# Patient Record
Sex: Female | Born: 1935
Health system: Southern US, Community
[De-identification: ages and names within clinical notes are randomized; demographics above are authoritative.]

## PROBLEM LIST (undated history)

## (undated) DIAGNOSIS — E785 Hyperlipidemia, unspecified: Secondary | ICD-10-CM

## (undated) DIAGNOSIS — F32A Depression, unspecified: Secondary | ICD-10-CM

## (undated) DIAGNOSIS — E119 Type 2 diabetes mellitus without complications: Secondary | ICD-10-CM

## (undated) DIAGNOSIS — I10 Essential (primary) hypertension: Secondary | ICD-10-CM

## (undated) DIAGNOSIS — K219 Gastro-esophageal reflux disease without esophagitis: Secondary | ICD-10-CM

## (undated) DIAGNOSIS — K589 Irritable bowel syndrome without diarrhea: Secondary | ICD-10-CM

## (undated) DIAGNOSIS — J449 Chronic obstructive pulmonary disease, unspecified: Secondary | ICD-10-CM

## (undated) DIAGNOSIS — H409 Unspecified glaucoma: Secondary | ICD-10-CM

## (undated) DIAGNOSIS — F419 Anxiety disorder, unspecified: Secondary | ICD-10-CM

## (undated) DIAGNOSIS — J45909 Unspecified asthma, uncomplicated: Secondary | ICD-10-CM

## (undated) DIAGNOSIS — I2510 Atherosclerotic heart disease of native coronary artery without angina pectoris: Secondary | ICD-10-CM

## (undated) DIAGNOSIS — G8929 Other chronic pain: Secondary | ICD-10-CM

## (undated) DIAGNOSIS — M549 Dorsalgia, unspecified: Secondary | ICD-10-CM

## (undated) DIAGNOSIS — F329 Major depressive disorder, single episode, unspecified: Secondary | ICD-10-CM

## (undated) DIAGNOSIS — I639 Cerebral infarction, unspecified: Secondary | ICD-10-CM

## (undated) DIAGNOSIS — M1990 Unspecified osteoarthritis, unspecified site: Secondary | ICD-10-CM

## (undated) HISTORY — DX: Gastro-esophageal reflux disease without esophagitis: K21.9

## (undated) HISTORY — DX: Type 2 diabetes mellitus without complications: E11.9

## (undated) HISTORY — DX: Atherosclerotic heart disease of native coronary artery without angina pectoris: I25.10

## (undated) HISTORY — DX: Essential (primary) hypertension: I10

## (undated) HISTORY — DX: Other chronic pain: G89.29

## (undated) HISTORY — DX: Depression, unspecified: F32.A

## (undated) HISTORY — DX: Irritable bowel syndrome, unspecified: K58.9

## (undated) HISTORY — DX: Major depressive disorder, single episode, unspecified: F32.9

## (undated) HISTORY — PX: APPENDECTOMY: SHX54

## (undated) HISTORY — DX: Dorsalgia, unspecified: M54.9

## (undated) HISTORY — DX: Unspecified osteoarthritis, unspecified site: M19.90

## (undated) HISTORY — PX: CHOLECYSTECTOMY: SHX55

## (undated) HISTORY — DX: Chronic obstructive pulmonary disease, unspecified: J44.9

## (undated) HISTORY — PX: ABDOMINAL HYSTERECTOMY: SHX81

## (undated) HISTORY — DX: Anxiety disorder, unspecified: F41.9

## (undated) HISTORY — DX: Hyperlipidemia, unspecified: E78.5

---

## 1989-01-28 HISTORY — PX: CORONARY ANGIOPLASTY WITH STENT PLACEMENT: SHX49

## 1998-08-30 ENCOUNTER — Inpatient Hospital Stay (HOSPITAL_COMMUNITY): Admission: EM | Admit: 1998-08-30 | Discharge: 1998-09-06 | Payer: Self-pay | Admitting: Cardiovascular Disease

## 1998-08-31 ENCOUNTER — Encounter: Payer: Self-pay | Admitting: Neurosurgery

## 1999-04-26 ENCOUNTER — Encounter: Payer: Self-pay | Admitting: Cardiology

## 1999-04-26 ENCOUNTER — Inpatient Hospital Stay (HOSPITAL_COMMUNITY): Admission: AD | Admit: 1999-04-26 | Discharge: 1999-04-30 | Payer: Self-pay | Admitting: *Deleted

## 1999-07-24 ENCOUNTER — Encounter: Payer: Self-pay | Admitting: Cardiology

## 1999-07-24 ENCOUNTER — Inpatient Hospital Stay (HOSPITAL_COMMUNITY): Admission: AD | Admit: 1999-07-24 | Discharge: 1999-07-27 | Payer: Self-pay | Admitting: Cardiology

## 2000-03-04 ENCOUNTER — Ambulatory Visit (HOSPITAL_COMMUNITY): Admission: RE | Admit: 2000-03-04 | Discharge: 2000-03-04 | Payer: Self-pay | Admitting: Neurosurgery

## 2000-03-04 ENCOUNTER — Encounter: Payer: Self-pay | Admitting: Neurosurgery

## 2000-03-25 ENCOUNTER — Encounter: Payer: Self-pay | Admitting: Neurosurgery

## 2000-03-25 ENCOUNTER — Ambulatory Visit (HOSPITAL_COMMUNITY): Admission: RE | Admit: 2000-03-25 | Discharge: 2000-03-25 | Payer: Self-pay | Admitting: Neurosurgery

## 2000-04-08 ENCOUNTER — Ambulatory Visit (HOSPITAL_COMMUNITY): Admission: RE | Admit: 2000-04-08 | Discharge: 2000-04-08 | Payer: Self-pay | Admitting: Neurosurgery

## 2000-04-08 ENCOUNTER — Encounter: Payer: Self-pay | Admitting: Neurosurgery

## 2000-05-26 ENCOUNTER — Encounter (HOSPITAL_COMMUNITY): Admission: RE | Admit: 2000-05-26 | Discharge: 2000-06-25 | Payer: Self-pay | Admitting: Pulmonary Disease

## 2000-06-26 ENCOUNTER — Encounter (HOSPITAL_COMMUNITY): Admission: RE | Admit: 2000-06-26 | Discharge: 2000-07-26 | Payer: Self-pay | Admitting: Pulmonary Disease

## 2000-07-29 ENCOUNTER — Encounter (HOSPITAL_COMMUNITY): Admission: RE | Admit: 2000-07-29 | Discharge: 2000-08-28 | Payer: Self-pay | Admitting: Pulmonary Disease

## 2000-09-09 ENCOUNTER — Encounter (HOSPITAL_COMMUNITY): Admission: RE | Admit: 2000-09-09 | Discharge: 2000-10-09 | Payer: Self-pay | Admitting: Pulmonary Disease

## 2000-10-14 ENCOUNTER — Encounter (HOSPITAL_COMMUNITY): Admission: RE | Admit: 2000-10-14 | Discharge: 2000-11-13 | Payer: Self-pay | Admitting: Pulmonary Disease

## 2001-01-08 ENCOUNTER — Emergency Department (HOSPITAL_COMMUNITY): Admission: EM | Admit: 2001-01-08 | Discharge: 2001-01-08 | Payer: Self-pay | Admitting: *Deleted

## 2001-01-08 ENCOUNTER — Encounter: Payer: Self-pay | Admitting: *Deleted

## 2001-04-25 ENCOUNTER — Encounter: Payer: Self-pay | Admitting: Emergency Medicine

## 2001-04-25 ENCOUNTER — Inpatient Hospital Stay (HOSPITAL_COMMUNITY): Admission: EM | Admit: 2001-04-25 | Discharge: 2001-04-27 | Payer: Self-pay | Admitting: Cardiology

## 2001-04-25 ENCOUNTER — Emergency Department (HOSPITAL_COMMUNITY): Admission: EM | Admit: 2001-04-25 | Discharge: 2001-04-25 | Payer: Self-pay | Admitting: Emergency Medicine

## 2001-04-27 ENCOUNTER — Encounter: Payer: Self-pay | Admitting: Cardiology

## 2001-08-05 ENCOUNTER — Encounter: Payer: Self-pay | Admitting: Internal Medicine

## 2001-08-05 ENCOUNTER — Emergency Department (HOSPITAL_COMMUNITY): Admission: EM | Admit: 2001-08-05 | Discharge: 2001-08-05 | Payer: Self-pay | Admitting: Internal Medicine

## 2001-11-04 ENCOUNTER — Emergency Department (HOSPITAL_COMMUNITY): Admission: EM | Admit: 2001-11-04 | Discharge: 2001-11-05 | Payer: Self-pay | Admitting: *Deleted

## 2001-11-04 ENCOUNTER — Encounter: Payer: Self-pay | Admitting: *Deleted

## 2002-08-02 ENCOUNTER — Encounter: Payer: Self-pay | Admitting: *Deleted

## 2002-08-02 ENCOUNTER — Emergency Department (HOSPITAL_COMMUNITY): Admission: EM | Admit: 2002-08-02 | Discharge: 2002-08-02 | Payer: Self-pay | Admitting: *Deleted

## 2002-11-08 ENCOUNTER — Inpatient Hospital Stay (HOSPITAL_COMMUNITY): Admission: EM | Admit: 2002-11-08 | Discharge: 2002-11-10 | Payer: Self-pay | Admitting: Cardiology

## 2002-11-08 ENCOUNTER — Encounter: Payer: Self-pay | Admitting: Emergency Medicine

## 2002-11-08 ENCOUNTER — Emergency Department (HOSPITAL_COMMUNITY): Admission: EM | Admit: 2002-11-08 | Discharge: 2002-11-08 | Payer: Self-pay | Admitting: Emergency Medicine

## 2002-12-04 ENCOUNTER — Emergency Department (HOSPITAL_COMMUNITY): Admission: EM | Admit: 2002-12-04 | Discharge: 2002-12-04 | Payer: Self-pay | Admitting: Emergency Medicine

## 2003-04-21 ENCOUNTER — Ambulatory Visit (HOSPITAL_COMMUNITY): Admission: RE | Admit: 2003-04-21 | Discharge: 2003-04-21 | Payer: Self-pay | Admitting: Pulmonary Disease

## 2003-11-13 ENCOUNTER — Inpatient Hospital Stay (HOSPITAL_COMMUNITY): Admission: EM | Admit: 2003-11-13 | Discharge: 2003-11-17 | Payer: Self-pay | Admitting: Emergency Medicine

## 2004-07-09 ENCOUNTER — Ambulatory Visit: Payer: Self-pay | Admitting: Cardiology

## 2004-07-13 ENCOUNTER — Ambulatory Visit: Payer: Self-pay

## 2005-01-28 HISTORY — PX: COLONOSCOPY WITH ESOPHAGOGASTRODUODENOSCOPY (EGD): SHX5779

## 2005-02-11 ENCOUNTER — Ambulatory Visit: Payer: Self-pay | Admitting: Cardiology

## 2005-03-26 ENCOUNTER — Ambulatory Visit: Payer: Self-pay | Admitting: Cardiology

## 2005-09-21 ENCOUNTER — Emergency Department (HOSPITAL_COMMUNITY): Admission: EM | Admit: 2005-09-21 | Discharge: 2005-09-21 | Payer: Self-pay | Admitting: Emergency Medicine

## 2005-10-07 ENCOUNTER — Ambulatory Visit: Payer: Self-pay | Admitting: Gastroenterology

## 2005-11-11 ENCOUNTER — Ambulatory Visit (HOSPITAL_COMMUNITY): Admission: RE | Admit: 2005-11-11 | Discharge: 2005-11-11 | Payer: Self-pay | Admitting: Gastroenterology

## 2005-11-11 ENCOUNTER — Ambulatory Visit: Payer: Self-pay | Admitting: Gastroenterology

## 2005-12-23 ENCOUNTER — Ambulatory Visit: Payer: Self-pay | Admitting: Gastroenterology

## 2006-02-28 ENCOUNTER — Ambulatory Visit (HOSPITAL_COMMUNITY): Admission: RE | Admit: 2006-02-28 | Discharge: 2006-02-28 | Payer: Self-pay | Admitting: Pulmonary Disease

## 2006-06-09 ENCOUNTER — Ambulatory Visit (HOSPITAL_COMMUNITY): Admission: RE | Admit: 2006-06-09 | Discharge: 2006-06-09 | Payer: Self-pay | Admitting: Pulmonary Disease

## 2006-07-02 ENCOUNTER — Ambulatory Visit: Payer: Self-pay | Admitting: Gastroenterology

## 2007-02-04 ENCOUNTER — Ambulatory Visit: Payer: Self-pay | Admitting: Gastroenterology

## 2007-02-16 ENCOUNTER — Emergency Department (HOSPITAL_COMMUNITY): Admission: EM | Admit: 2007-02-16 | Discharge: 2007-02-16 | Payer: Self-pay | Admitting: Emergency Medicine

## 2007-08-07 ENCOUNTER — Inpatient Hospital Stay (HOSPITAL_COMMUNITY): Admission: EM | Admit: 2007-08-07 | Discharge: 2007-08-13 | Payer: Self-pay | Admitting: Emergency Medicine

## 2007-10-01 ENCOUNTER — Ambulatory Visit (HOSPITAL_COMMUNITY): Admission: RE | Admit: 2007-10-01 | Discharge: 2007-10-01 | Payer: Self-pay | Admitting: Pulmonary Disease

## 2008-03-31 ENCOUNTER — Ambulatory Visit: Payer: Self-pay | Admitting: Gastroenterology

## 2008-03-31 ENCOUNTER — Encounter: Payer: Self-pay | Admitting: Gastroenterology

## 2008-03-31 DIAGNOSIS — R1013 Epigastric pain: Secondary | ICD-10-CM

## 2008-03-31 LAB — CONVERTED CEMR LAB: Helicobacter Pylori Antibody-IgG: 0.5

## 2008-04-01 ENCOUNTER — Ambulatory Visit (HOSPITAL_COMMUNITY): Admission: RE | Admit: 2008-04-01 | Discharge: 2008-04-01 | Payer: Self-pay | Admitting: Gastroenterology

## 2008-04-01 LAB — CONVERTED CEMR LAB
ALT: 10 units/L (ref 0–35)
AST: 15 units/L (ref 0–37)
Albumin: 3.7 g/dL (ref 3.5–5.2)
Alkaline Phosphatase: 63 units/L (ref 39–117)
BUN: 9 mg/dL (ref 6–23)
CO2: 31 meq/L (ref 19–32)
Calcium: 9.4 mg/dL (ref 8.4–10.5)
Chloride: 102 meq/L (ref 96–112)
Creatinine, Ser: 0.8 mg/dL (ref 0.40–1.20)
Glucose, Bld: 110 mg/dL — ABNORMAL HIGH (ref 70–99)
Lipase: 18 units/L (ref 11–59)
Potassium: 3.5 meq/L (ref 3.5–5.3)
Sodium: 138 meq/L (ref 135–145)
Total Bilirubin: 1 mg/dL (ref 0.3–1.2)
Total Protein: 6.8 g/dL (ref 6.0–8.3)

## 2008-05-03 ENCOUNTER — Encounter: Payer: Self-pay | Admitting: Gastroenterology

## 2008-05-10 DIAGNOSIS — J439 Emphysema, unspecified: Secondary | ICD-10-CM

## 2008-05-10 DIAGNOSIS — K5900 Constipation, unspecified: Secondary | ICD-10-CM | POA: Insufficient documentation

## 2008-05-10 DIAGNOSIS — M549 Dorsalgia, unspecified: Secondary | ICD-10-CM | POA: Insufficient documentation

## 2008-05-10 DIAGNOSIS — K5730 Diverticulosis of large intestine without perforation or abscess without bleeding: Secondary | ICD-10-CM | POA: Insufficient documentation

## 2008-05-10 DIAGNOSIS — K649 Unspecified hemorrhoids: Secondary | ICD-10-CM | POA: Insufficient documentation

## 2008-05-10 DIAGNOSIS — M545 Low back pain, unspecified: Secondary | ICD-10-CM | POA: Insufficient documentation

## 2008-05-10 DIAGNOSIS — F411 Generalized anxiety disorder: Secondary | ICD-10-CM | POA: Insufficient documentation

## 2008-05-11 ENCOUNTER — Ambulatory Visit: Payer: Self-pay | Admitting: Gastroenterology

## 2008-06-19 ENCOUNTER — Emergency Department (HOSPITAL_COMMUNITY): Admission: EM | Admit: 2008-06-19 | Discharge: 2008-06-19 | Payer: Self-pay | Admitting: Emergency Medicine

## 2008-07-07 ENCOUNTER — Ambulatory Visit: Payer: Self-pay | Admitting: Gastroenterology

## 2008-07-08 DIAGNOSIS — K2970 Gastritis, unspecified, without bleeding: Secondary | ICD-10-CM | POA: Insufficient documentation

## 2008-07-08 DIAGNOSIS — K2990 Gastroduodenitis, unspecified, without bleeding: Secondary | ICD-10-CM

## 2008-09-22 ENCOUNTER — Ambulatory Visit: Payer: Self-pay | Admitting: Gastroenterology

## 2009-01-18 ENCOUNTER — Encounter (INDEPENDENT_AMBULATORY_CARE_PROVIDER_SITE_OTHER): Payer: Self-pay | Admitting: *Deleted

## 2009-03-01 ENCOUNTER — Ambulatory Visit (HOSPITAL_COMMUNITY): Admission: RE | Admit: 2009-03-01 | Discharge: 2009-03-01 | Payer: Self-pay | Admitting: Pulmonary Disease

## 2009-03-02 ENCOUNTER — Ambulatory Visit (HOSPITAL_COMMUNITY): Admission: RE | Admit: 2009-03-02 | Discharge: 2009-03-02 | Payer: Self-pay | Admitting: Pulmonary Disease

## 2009-03-06 ENCOUNTER — Ambulatory Visit: Payer: Self-pay | Admitting: Cardiology

## 2009-04-28 ENCOUNTER — Ambulatory Visit: Payer: Self-pay | Admitting: Gastroenterology

## 2009-07-10 IMAGING — CR DG SHOULDER 2+V*L*
3 series · 3 of 3 positions shown · non-contrast
Comparison: None

CLINICAL DATA: Left shoulder pain status post fall

LEFT SHOULDER - 2+ VIEW

[view not recorded (1 of 3)]
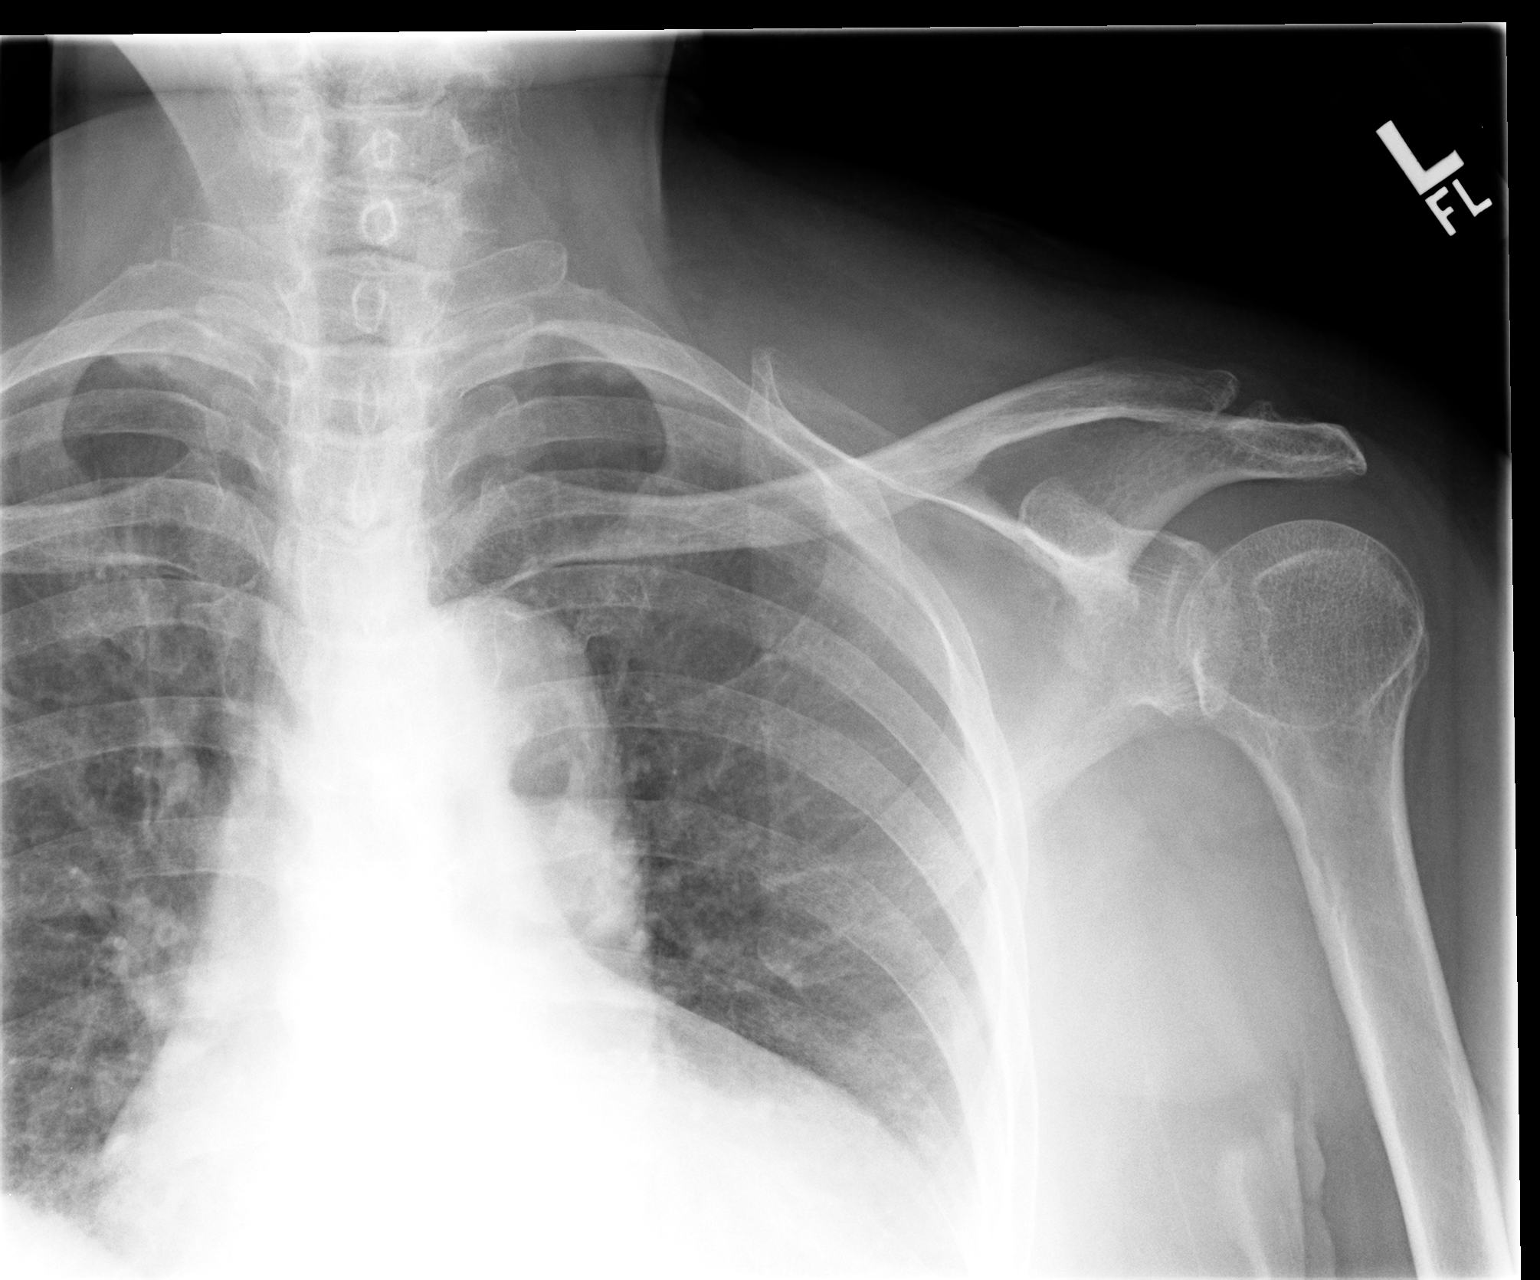

[view not recorded (2 of 3)]
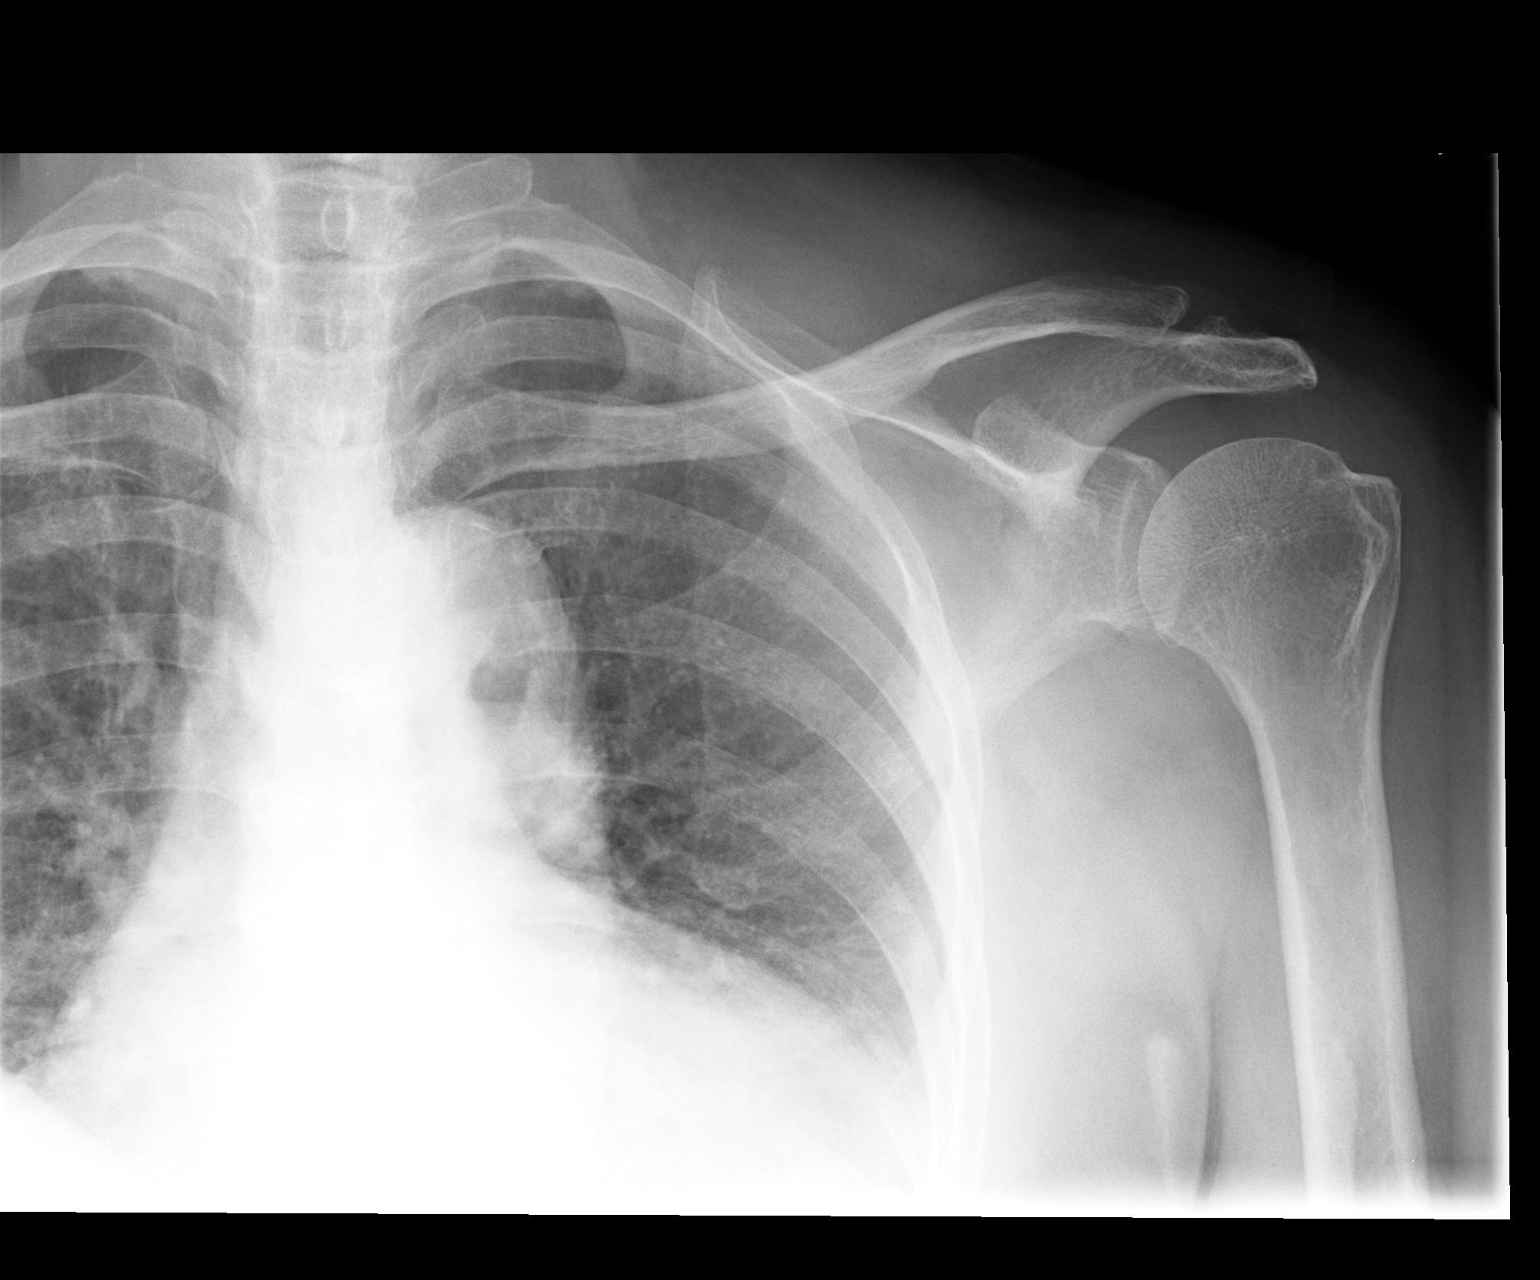

[view not recorded (3 of 3)]
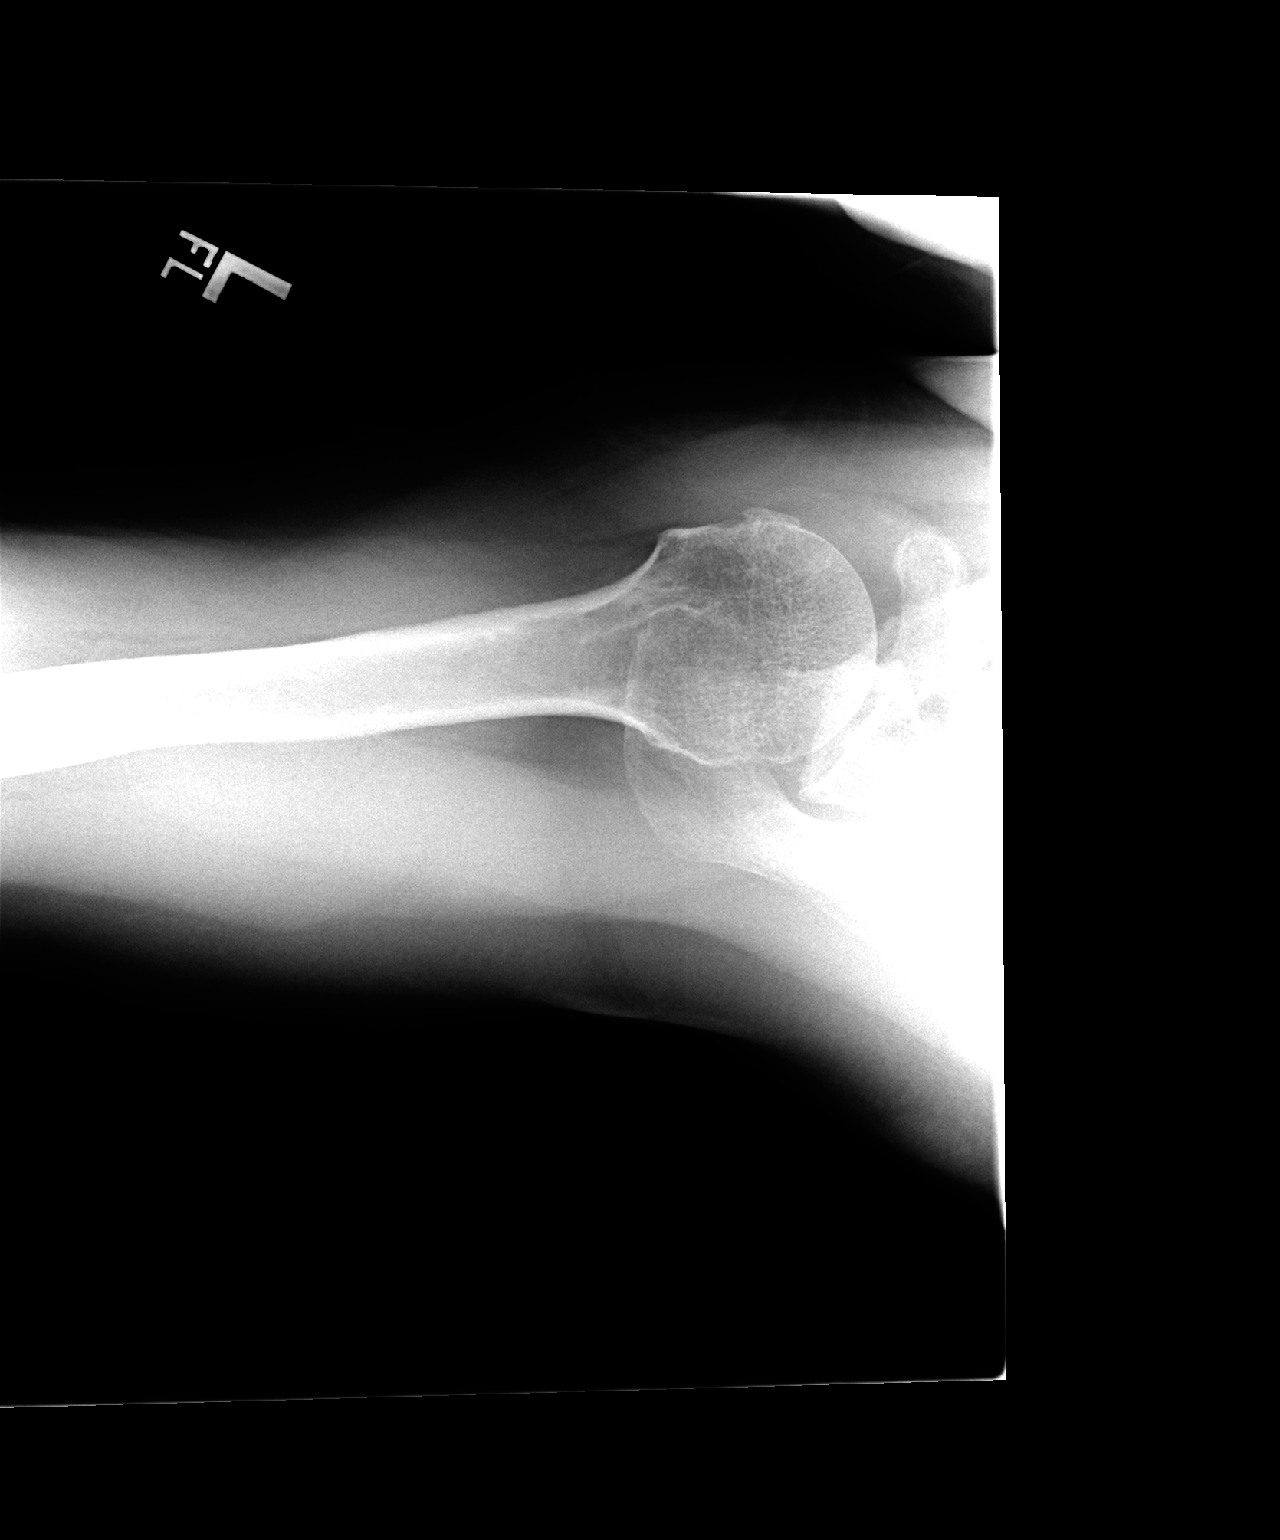

[3 of 3 positions shown; findings below may reference images not displayed]

FINDINGS: Diffuse bony demineralization.
AC joint alignment normal.
No glenohumeral fracture or dislocation.
Visualized left ribs intact.
Suspect cardiomegaly and tortuous aorta.
IMPRESSION: Bony demineralization.
No acute left shoulder abnormalities.

## 2009-08-31 ENCOUNTER — Encounter (INDEPENDENT_AMBULATORY_CARE_PROVIDER_SITE_OTHER): Payer: Self-pay | Admitting: *Deleted

## 2009-10-25 ENCOUNTER — Ambulatory Visit (HOSPITAL_COMMUNITY)
Admission: RE | Admit: 2009-10-25 | Discharge: 2009-10-25 | Payer: Self-pay | Source: Home / Self Care | Admitting: Pulmonary Disease

## 2009-12-07 ENCOUNTER — Ambulatory Visit (HOSPITAL_COMMUNITY): Admission: RE | Admit: 2009-12-07 | Discharge: 2009-12-07 | Payer: Self-pay | Admitting: Ophthalmology

## 2010-01-28 HISTORY — PX: ESOPHAGOGASTRODUODENOSCOPY: SHX1529

## 2010-03-01 NOTE — Assessment & Plan Note (Signed)
Summary: EPIGASTRIC PAIN, GERD   Visit Type:  Follow-up Visit Primary Care Provider:  [name], M.D.  Chief Complaint:  gerd.  History of Present Illness: Having right shoulder pain and used Ibuprofen. Off Ibuprofen for 2 weeks. Was Abx for sinus infection. Last dose: yesterday. Completed two weeks of therapy. Stomach on fire: in epigastrium. Doing well until shoulder problems.  Current Medications (verified): 1)  Nexium 40 Mg Cpdr (Esomeprazole Magnesium) .... Take 1 Tablet By Mouth Once Daily 2)  Amlodipine Besy-Benazepril Hcl 5-20 Mg Caps (Amlodipine Besy-Benazepril Hcl) .Marland Kitchen.. 1 Tab By Mouth Once Daily 3)  Ventilin Inhaler .... As Directed 4)  Lasix 40 Mg Tabs (Furosemide) .... As Needed 5)  Lumigan Opthalmic .... As Directed 6)  Hydrocodone-Acetaminophen 5-325 Mg Tabs (Hydrocodone-Acetaminophen) .... As Needed 7)  Ceftin 500 Mg Tabs (Cefuroxime Axetil) .... Take 1 Tablet By Mouth Two Times A Day 8)  Ibuprofen 800 Mg Tabs (Ibuprofen) .... As Needed  Allergies (verified): 1)  ! Hydrocodone 2)  ! Codeine 3)  ! Morphine 4)  ! * Peanuts  Past History:  Past Medical History: Glaucoma IBS-Constipation/Diarrhea GERD Diverticulosis Hyperlipidemia Hypertension Hemorrhoids causing rectal bleeding Nerve problems/pain in Right shoulder  Review of Systems       Now ignoring family problems. Son finally got a job: age 27.  Vital Signs:  Patient profile:   77 year old female Height:      63 inches Weight:      140.50 pounds BMI:     24.98 Temp:     97.6 degrees F oral Pulse rate:   64 / minute BP sitting:   180 / 90  (left arm) Cuff size:   large  Vitals Entered By: Cloria Spring LPN (April 28, 1608 2:55 PM)  Physical Exam  General:  Well developed, well nourished, no acute distress. Head:  Normocephalic and atraumatic. Mouth:  No deformity or lesions, dentition poor. Lungs:  Clear throughout to auscultation. Heart:  Regular rate and rhythm; no murmurs, rubs,  or  bruits. Abdomen:  Soft, mild TTP in epigatrium without guarding and without rebound, nondistended. Normal bowel sounds.without guarding and without rebound.    Impression & Recommendations:  Problem # 1:  GASTRITIS (ICD-535.50) Avoid Ibuprofen. Add Carafate. OPV in 4 mos.  Problem # 2:  GERD (ICD-530.81) Assessment: Improved  on Nexium two times a day. Refill for #60. Obtain authorization for 60. Pt ask pharmacy how to obtain #60 a month. OPV in 4 mos.  CC: PCP  Orders: Est. Patient Level III (96045) Prescriptions: NEXIUM 40 MG CPDR (ESOMEPRAZOLE MAGNESIUM) Take 1 tablet by mouth bid  #60 x 5   Entered and Authorized by:   West Bali MD   Signed by:   West Bali MD on 04/28/2009   Method used:   Electronically to        [facility] [address] [phone]* (retail)       [address] [place], [place]  [address]       Ph: [phone]       Fax: [phone]   RxID:   [id number] CARAFATE 1 GM/10ML SUSP (SUCRALFATE) 1 by mouth QID FOR 2 WEEKS.  #56 x 1   Entered and Authorized by:   West Bali MD   Signed by:   West Bali MD on 04/28/2009   Method used:   Electronically to        [facility] [phone]* (retail)  [address]       [place], [place]  [address]       Ph: [phone]       Fax: [phone]   RxID:   [id number]

## 2010-03-01 NOTE — Letter (Signed)
Summary: Recall Office Visit  [facility]  [address]   [place], [place] [address]   Phone: [phone]  Fax: [phone]      August 31, 2009   Sheila Myers [address] [place], [place]  [address] Jun 05, 1928   Dear [name],   According to our records, it is time for you to schedule a follow-up office visit with [place].   At your convenience, please call [phone] to schedule an office visit. If you have any questions, concerns, or feel that this letter is in error, we would appreciate your call.   Sincerely,    Diana Eves  [facility] Ph: [phone]   Fax: [phone]

## 2010-03-30 ENCOUNTER — Encounter: Payer: Self-pay | Admitting: Urgent Care

## 2010-04-03 ENCOUNTER — Encounter: Payer: Self-pay | Admitting: Urgent Care

## 2010-04-10 LAB — BASIC METABOLIC PANEL
BUN: 10 mg/dL (ref 6–23)
CO2: 30 mEq/L (ref 19–32)
Calcium: 9.6 mg/dL (ref 8.4–10.5)
Chloride: 101 mEq/L (ref 96–112)
Creatinine, Ser: 0.72 mg/dL (ref 0.4–1.2)
GFR calc Af Amer: >60 mL/min (ref >60)
GFR calc non Af Amer: >60 mL/min (ref >60)
Glucose, Bld: 120 mg/dL — ABNORMAL HIGH (ref 70–99)
Potassium: 3.3 mEq/L — ABNORMAL LOW (ref 3.5–5.1)
Sodium: 138 mEq/L (ref 135–145)

## 2010-04-10 LAB — HEMOGLOBIN AND HEMATOCRIT, BLOOD
HCT: 44.7 % (ref 36.0–46.0)
Hemoglobin: 14.3 g/dL (ref 12.0–15.0)

## 2010-04-10 NOTE — Medication Information (Signed)
Summary: NEXIUM 40MG   NEXIUM 40MG    Imported By: Rexene Alberts 04/03/2010 08:18:38  _____________________________________________________________________  External Attachment:    Type:   Image     Comment:   External Document  Appended Document: NEXIUM 40MG  duplicare--sent 03/30/10

## 2010-04-10 NOTE — Medication Information (Addendum)
Summary: NEXIUM 40MG   NEXIUM 40MG    Imported By: Rexene Alberts 03/30/2010 15:56:38  _____________________________________________________________________  External Attachment:    Type:   Image     Comment:   External Document  Appended Document: NEXIUM 40MG     Prescriptions: NEXIUM 40 MG CPDR (ESOMEPRAZOLE MAGNESIUM) Take 1 tablet by mouth bid  #60 x 0   Entered and Authorized by:   Joselyn Arrow FNP-BC   Signed by:   Joselyn Arrow FNP-BC on 04/02/2010   Method used:   Electronically to        [facility] [address] [phone]* (retail)       [address] [place], [place]  [address]       Ph: [phone]       Fax: [phone]   RxID:   [id number]  Needs OV prior to further RFs. Thanks   Appended Document: NEXIUM 40MG  pt aware of her OV on 4/11 @ 0930 w/SF

## 2010-05-09 ENCOUNTER — Encounter: Payer: Self-pay | Admitting: Gastroenterology

## 2010-05-09 ENCOUNTER — Ambulatory Visit (INDEPENDENT_AMBULATORY_CARE_PROVIDER_SITE_OTHER): Payer: PRIVATE HEALTH INSURANCE | Admitting: Gastroenterology

## 2010-05-09 DIAGNOSIS — K219 Gastro-esophageal reflux disease without esophagitis: Secondary | ICD-10-CM

## 2010-05-09 DIAGNOSIS — Z1211 Encounter for screening for malignant neoplasm of colon: Secondary | ICD-10-CM

## 2010-05-09 DIAGNOSIS — R1013 Epigastric pain: Secondary | ICD-10-CM

## 2010-05-09 DIAGNOSIS — K589 Irritable bowel syndrome without diarrhea: Secondary | ICD-10-CM

## 2010-05-09 DIAGNOSIS — K3189 Other diseases of stomach and duodenum: Secondary | ICD-10-CM

## 2010-05-09 MED ORDER — ESOMEPRAZOLE MAGNESIUM 40 MG PO PACK: PACK | ORAL | Status: DC

## 2010-05-09 NOTE — Assessment & Plan Note (Addendum)
Sx fairly well controlled if pt follows diet. Continue Nexium daily.Follow a low fat diet. SEE HO

## 2010-05-09 NOTE — Assessment & Plan Note (Signed)
MOST LIKELY ETIOLOGY FOR BLOATING. Differential includes lactose intolerance. IFYOU EAT ICE CREAM, MILK, OR CHEESE, USE LACTASE PILLS. FOLLOW UP IN 4 MOS.

## 2010-05-09 NOTE — Assessment & Plan Note (Addendum)
NEW ONSET-differential includes steroid gastritis, gastric CA, non-ulcer dyspepsia, functional abd pain and less likely H. Pylori gastritis or atypical GERD. WEIGHT INCREASE 5 LBS IN 1 YEAR .UPPER ENDOSCOPY WITHIN THE NEXT 1-2 WEEKS. CONTINUE NEXIUM AND TAKE 30 MINUTES PRIOR TO MEALS.

## 2010-05-09 NOTE — Progress Notes (Signed)
Pt is aware of OV 09/06/10@ 0930 with SF

## 2010-05-09 NOTE — Patient Instructions (Signed)
Follow a low fat diet. SEE INFORMATION BELOW. UPPER ENDOSCOPY WITHIN THE NEXT 1-2 WEEKS. CONTINUE NEXIUM AND TAKE 30 MINUTES PRIOR TO MEALS. IF YOU EAT ICE CREAM, MILK, OR CHEESE, USE LACTASE PILLS. FOLLOW UP IN 4 MOS.   Low-Fat, Low-Saturated-Fat, Low-Cholesterol Diets Food Selection Guide BREADS, CEREALS, PASTA, RICE, DRIED PEAS, AND BEANS These products are high in carbohydrates and most are low in fat. Therefore, they can be increased in the diet as substitutes for fatty foods. They too, however, contain calories and should not be eaten in excess. Cereals can be eaten for snacks as well as for breakfast.  Include foods that contain fiber (fruits, vegetables, whole grains, and legumes). Research shows that fiber may lower blood cholesterol levels, especially the water-soluble fiber found in fruits, vegetables, oat products, and legumes. FRUITS AND VEGETABLES It is good to eat fruits and vegetables. Besides being sources of fiber, both are rich in vitamins and some minerals. They help you get the daily allowances of these nutrients. Fruits and vegetables can be used for snacks and desserts. MEATS Limit lean meat, chicken, [place], and fish to no more than 6 ounces per day. Beef, Pork, and Lamb  Use lean cuts of beef, pork, and lamb. Lean cuts include:   Extra-lean ground beef.   Arm roast.   Sirloin tip.   Center-cut ham.   Round steak.   Loin chops.   Rump roast.   Tenderloin.  Trim all fat off the outside of meats before cooking. It is not necessary to severely decrease the intake of red meat, but lean choices should be made. Lean meat is rich in protein and contains a highly absorbable form of iron. Premenopausal women, in particular, should avoid reducing lean red meat because this could increase the risk for low red blood cells (iron-deficiency anemia). Processed Meats Processed meats, such as bacon, bologna, salami, sausage, and hot dogs contain large quantities of fat,  are not rich in valuable nutrients, and should not be eaten very often. Organ Meats The organ meats, such as liver, sweetbreads, kidneys, and brain are very rich in cholesterol. They should be limited. Chicken and [place] These are good sources of protein. The fat of poultry can be reduced by removing the skin and underlying fat layers before cooking. Chicken and [place] can be substituted for lean red meat in the diet. Poultry should not be fried or covered with high-fat sauces. Fish and Shellfish Fish is a good source of protein. Shellfish contain cholesterol, but they usually are low in saturated fatty acids. The preparation of fish is important. Like chicken and [place], they should not be fried or covered with high-fat sauces. EGGS Egg yolks often are hidden in cooked and processed foods. Egg whites contain no fat or cholesterol. They can be eaten often. Try 1 to 2 egg whites instead of whole eggs in recipes or use egg substitutes that do not contain yolk. MILK AND DAIRY PRODUCTS Use skim or 1% milk instead of 2% or whole milk. Decrease whole milk, natural, and processed cheeses. Use nonfat or low-fat (2%) cottage cheese or low-fat cheeses made from vegetable oils. Choose nonfat or low-fat (1 to 2%) yogurt. Experiment with evaporated skim milk in recipes that call for heavy cream. Substitute low-fat yogurt or low-fat cottage cheese for sour cream in dips and salad dressings. Have at least 2 servings of low-fat dairy products, such as 2 glasses of skim (or 1%) milk each day to help get your daily calcium intake. FATS AND  OILS Reduce the total intake of fats, especially saturated fat. Butterfat, lard, and beef fats are high in saturated fat and cholesterol. These should be avoided as much as possible. Vegetable fats do not contain cholesterol, but certain vegetable fats, such as coconut oil, palm oil, and palm kernel oil are very high in saturated fats. These should be limited. These fats are often used  in bakery goods, processed foods, popcorn, oils, and nondairy creamers. Vegetable shortenings and some peanut butters contain hydrogenated oils, which are also saturated fats. Read the labels on these foods and check for saturated vegetable oils. Unsaturated vegetable oils and fats do not raise blood cholesterol. However, they should be limited because they are fats and are high in calories. Total fat should still be limited to 30% of your daily caloric intake. Desirable liquid vegetable oils are corn oil, cottonseed oil, olive oil, canola oil, safflower oil, soybean oil, and sunflower oil. Peanut oil is not as good, but small amounts are acceptable. Buy a heart-healthy tub margarine that has no partially hydrogenated oils in the ingredients. Mayonnaise and salad dressings often are made from unsaturated fats, but they should also be limited because of their high calorie and fat content. Seeds, nuts, peanut butter, olives, and avocados are high in fat, but the fat is mainly the unsaturated type. These foods should be limited mainly to avoid excess calories and fat. OTHER EATING TIPS Snacks  Most sweets should be limited as snacks. They tend to be rich in calories and fats, and their caloric content outweighs their nutritional value. Some good choices in snacks are graham crackers, melba toast, soda crackers, bagels (no egg), English muffins, fruits, and vegetables. These snacks are preferable to snack crackers, [place] fries, and chips. Popcorn should be air-popped or cooked in small amounts of liquid vegetable oil. Desserts Eat fruit, low-fat yogurt, and fruit ices instead of pastries, cake, and cookies. Sherbet, angel food cake, gelatin dessert, frozen low-fat yogurt, or other frozen products that do not contain saturated fat (pure fruit juice bars, frozen ice pops) are also acceptable.  COOKING METHODS Choose those methods that use little or no fat. They include:  Poaching.   Braising.   Steaming.     Grilling.   Baking.   Stir-frying.   Broiling.   Microwaving.  Foods can be cooked in a nonstick pan without added fat, or use a nonfat cooking spray in regular cookware. Limit fried foods and avoid frying in saturated fat. Add moisture to lean meats by using water, broth, cooking wines, and other nonfat or low-fat sauces along with the cooking methods mentioned above. Soups and stews should be chilled after cooking. The fat that forms on top after a few hours in the refrigerator should be skimmed off. When preparing meals, avoid using excess salt. Salt can contribute to raising blood pressure in some people. EATING AWAY FROM HOME Order entres, potatoes, and vegetables without sauces or butter. When meat exceeds the size of a deck of cards (3 to 4 ounces), the rest can be taken home for another meal. Choose vegetable or fruit salads and ask for low-calorie salad dressings to be served on the side. Use dressings sparingly. Limit high-fat toppings, such as bacon, crumbled eggs, cheese, sunflower seeds, and olives. Ask for heart-healthy tub margarine instead of butter. Document Released: 07/06/2001 Document Re-Released: 04/10/2009 [facility] Patient Information 2011 [place], [place].

## 2010-05-09 NOTE — Progress Notes (Signed)
  Subjective:    Patient ID: Sheila Myers, female    DOB: 1929-01-20, 77 y.o.   MRN: [id number]  HPI On Prednisone for back pain. Abdominal pain: off and on, flares up for past month. Pain: in center/near breastbone. Hot and burning-no nausea or vomiting, problems swallowing. Better after: watches what she eats. Stopped drinking sodas and avoiding foods with acid/greasy foods. Has helped. Gained 5 lbs since last year. Bms: once a day but not "nl" amount coming out. Also pain brought on by stress-self, worrying about folks. No diarrhea or fever.   Past Medical History  Diagnosis Date  . Irritable bowel syndrome   . GERD (gastroesophageal reflux disease)   . Diverticulosis   . Hemorrhoids   . Hyperlipidemia   . Glaucoma   . Right shoulder pain     Review of Systems  All other systems reviewed and are negative.       Objective:   Physical Exam  Constitutional: She appears well-developed and well-nourished. No distress.  HENT:  Head: Normocephalic and atraumatic.  Neck: Normal range of motion. Neck supple.  Cardiovascular: Normal rate and regular rhythm.   Pulmonary/Chest: Effort normal and breath sounds normal.  Abdominal: Soft. Bowel sounds are normal. There is tenderness.       Mild LUQ  Neurological: She is alert.  Psychiatric:       FLAT AFFECT          Assessment & Plan:

## 2010-05-11 ENCOUNTER — Other Ambulatory Visit: Payer: Self-pay | Admitting: Gastroenterology

## 2010-05-11 ENCOUNTER — Encounter: Payer: PRIVATE HEALTH INSURANCE | Admitting: Gastroenterology

## 2010-05-11 ENCOUNTER — Ambulatory Visit (HOSPITAL_COMMUNITY)
Admission: RE | Admit: 2010-05-11 | Discharge: 2010-05-11 | Disposition: A | Discharge status: Home / Self Care | Payer: PRIVATE HEALTH INSURANCE | Source: Ambulatory Visit | Attending: Gastroenterology | Admitting: Gastroenterology

## 2010-05-11 ENCOUNTER — Encounter: Payer: Self-pay | Admitting: Gastroenterology

## 2010-05-11 DIAGNOSIS — I10 Essential (primary) hypertension: Secondary | ICD-10-CM | POA: Insufficient documentation

## 2010-05-11 DIAGNOSIS — K222 Esophageal obstruction: Secondary | ICD-10-CM | POA: Insufficient documentation

## 2010-05-11 DIAGNOSIS — R1013 Epigastric pain: Secondary | ICD-10-CM | POA: Insufficient documentation

## 2010-05-11 DIAGNOSIS — K3189 Other diseases of stomach and duodenum: Secondary | ICD-10-CM | POA: Insufficient documentation

## 2010-05-11 DIAGNOSIS — E785 Hyperlipidemia, unspecified: Secondary | ICD-10-CM | POA: Insufficient documentation

## 2010-05-11 DIAGNOSIS — IMO0002 Reserved for concepts with insufficient information to code with codable children: Secondary | ICD-10-CM | POA: Insufficient documentation

## 2010-05-11 DIAGNOSIS — Z79899 Other long term (current) drug therapy: Secondary | ICD-10-CM | POA: Insufficient documentation

## 2010-05-11 DIAGNOSIS — E119 Type 2 diabetes mellitus without complications: Secondary | ICD-10-CM | POA: Insufficient documentation

## 2010-05-11 LAB — GLUCOSE, CAPILLARY: Glucose-Capillary: 120 mg/dL — ABNORMAL HIGH (ref 70–99)

## 2010-06-07 NOTE — Op Note (Signed)
Sheila Myers, ROCQUE             ACCOUNT NO.:  [id number]  MEDICAL RECORD NO.:  [id number]           PATIENT TYPE:  O  LOCATION:  DAYP                          FACILITY:  [facility]  PHYSICIAN:  Jonette Eva, M.D.     DATE OF BIRTH:  02-24-1928  DATE OF PROCEDURE:  05/11/2010 DATE OF DISCHARGE:                              OPERATIVE REPORT   REFERRING PHYSICIAN:  Dr. [name].  PROCEDURE:  Esophagogastroduodenoscopy with cold forceps biopsy of the gastric mucosa and Savary dilation to 16 mm.  INDICATION FOR EXAM:  Sheila Myers is a 77 year old female who presented with new-onset dyspepsia.  Her last upper endoscopy was in 2007 and she did not have biopsies.  She has been placed on prednisone as an outpatient for back pain.  She also complains of pain in the center and near her breast bone.  She describes a hot and burning and she was having problems swallowing and she gained 5 pounds this last year.  She has also has a significant past medical history of irritable bowel syndrome and gastroesophageal reflux disease.  FINDINGS: 1. Distal peptic stricture versus ring.  Her esophagus was dilated to     12.8-16 mm.  Otherwise, no evidence of Barrett mass, erosion, or     ulceration. 2. Patchy erythema in the antrum and occasional gastric polyps.     Biopsies obtained via cold forceps to evaluate the polyps as well     as for H. pylori gastritis. 3. Normal duodenal bulb and second portion of the duodenum with     moderate bile staining.  DIAGNOSES:  Dyspepsia, may be due to peptic stricture and/or gastritis.  RECOMMENDATIONS: 1. She should continue her Nexium and take it 30 minutes prior to her     meals. 2. We will call with the results of biopsies. 3. She already has a followup appointment to see me in 4 months. 4. No aspirin for 5 days. 5. She is given a handout on gastritis.  She may resume her previous     diet.  MEDICATIONS: 1. Demerol 100 mg IV. 2. Versed 5 mg  IV.  PROCEDURE TECHNIQUE:  Physical exam was performed.  Informed consent was obtained from the patient after explaining the benefits, risks, and alternatives to the procedure.  The patient was connected to monitor and placed in left lateral position.  Continuous oxygen was provided by nasal cannula and IV medicine was administered through an indwelling cannula.  After administration of sedation, the patient's esophagus was intubated.  Scope was advanced under direct visualization to the second portion of the duodenum.  Scope was removed slowly by careful examining the color, texture, anatomy, and integrity mucosa on the way out.  Prior to removal of the scope, retroflexed view of the cardia was performed and the Savary guidewire was placed.  The esophagus was dilated from 12.8-16 mm.  The 16-mm dilator passed with mild resistance.  The dilator and guide were removed.  The patient was recovered in endoscopy and discharged home in satisfactory condition.   PATH: NL GASTRIC MUCOSA  Jonette Eva, M.D.     SF/MEDQ  D:  05/11/2010  T:  05/12/2010  Job:  [id number]  Electronically Signed by Jonette Eva M.D. on 06/07/2010 04:57:12 PM

## 2010-06-12 NOTE — Discharge Summary (Signed)
NAME:  [name], [name]             ACCOUNT NO.:  [id number]   MEDICAL RECORD NO.:  [id number]           PATIENT TYPE:  INP   LOCATION:  A308                          FACILITY:  [facility]   PHYSICIAN:  Edward L. [name], M.D.DATE OF BIRTH:  1928-09-25   DATE OF ADMISSION:  08/07/2007  DATE OF DISCHARGE:  07/16/2009LH                               DISCHARGE SUMMARY   FINAL DISCHARGE DIAGNOSES:  1. Pneumonia  2. Chronic obstructive pulmonary disease.  3. Hypertension.  4. Gastroesophageal reflux disease.  5. Hyperlipidemia.  6. Coronary artery occlusive disease status post stenting.  7. Diabetes, diet-controlled.  8. Chronic low back pain.  9. Anxiety.   HISTORY:  Sheila Myers is a 77 year old who have had an upper  respiratory infection been treated with Levaquin, but then came to the  emergency room with cough, congestion, and pleuritic chest pain.  Chest  x-ray showed a right lower lobe pneumonia.  Because of the pneumonia  after being treated with antibiotics, she is admitted to the hospital.   Her physical examination shows her blood pressure was 192/89, pulse 74  regular, and respirations 20.  She had no jugular venous distention.  Her breath sounds were decreased.  She did not have any wheezing.  Her  heart was regular.  Her abdomen was soft. Extremities showed no edema.  CNS grossly intact.   HOSPITAL COURSE:  She was treated with intravenous antibiotics, inhaled  bronchodilators and improved albeit somewhat slowly.  By the time of  discharge, she was back basically to baseline.  She is discharged home  on Lotrel 5/20 daily, Nexium 40 mg daily, Xanax 0.5 t.i.d. as needed,  Lumigan eye drops 0.3 at bedtime, Vicodin 5/500 four times a day as  needed for pain.  She is going to be on Avelox 400 mg daily x5 days,  prednisone on a decreasing dose, and Pravachol 40 mg daily, and she has  Norel DM already at home.  She is going to have some home health with  physical therapy, etc.  because she is weak.      Edward L. [name], M.D.  Electronically Signed     ELH/MEDQ  D:  08/13/2007  T:  08/13/2007  Job:  [id number]

## 2010-06-12 NOTE — H&P (Signed)
NAME:  Sheila Myers, Sheila Myers NO.:  [id number]   MEDICAL RECORD NO.:  [id number]          PATIENT TYPE:  INP   LOCATION:  A308                          FACILITY:  [facility]   PHYSICIAN:  Melvyn Novas, MDDATE OF BIRTH:  1928-08-21   DATE OF ADMISSION:  08/07/2007  DATE OF DISCHARGE:  LH                              HISTORY & PHYSICAL   The patient is a 77 year old female patient of Dr. [name] who  apparently had an upper respiratory infection and was treated with 5  days of Levaquin.  She presented to the hospital complaining of  excessive coughing, some dyspnea when she coughs, and some pleuritic  chest pain.  She denies sputum or hemoptysis.  Essentially, chest x-ray  reveals right lower lobe pneumonia.  She is feeling the same or somewhat  worse.  Denies nausea, vomiting, fever, or chills.  She is being  admitted for a possible continuation of therapy, aggressive pulmonary  toilet, and observation for change in antibiotic therapy if Dr. [name]  deems necessary.   PAST MEDICAL HISTORY:  1. Hypertension.  2. GERD.  3. Pneumonia.  4. Hyperlipidemia.  5. Coronary artery disease with a stent.  6. Bronchitis.   She is allergic to CODEINE and MORPHINE.   CURRENT MEDICATIONS:  1. Pravachol 40 mg per day.  2. Vicodin 5/500 mg t.i.d. p.r.n.  3. Levaquin 750 daily, dispensed 85.  4. Xanax 0.5 t.i.d. p.r.n.  5. Lotrel 5/20 mg p.o. daily.  6. Lumigan 0.03% at bed time 1 drop each eye.  7. Prednisone 10 mg p.o. daily.   VITAL SIGNS:  Blood pressure is 192/89, pulse is 74 and regular,  respiratory rate is 20, and O2 sat is 97%.  EYES:  PERRLA.  Extraocular movements are intact.  Sclerae clear.  Conjunctivae pink.  NECK:  No JVD.  No carotid bruits.  No thyromegaly.  No thyroid bruits.  LUNGS:  Diminished breath sounds at the right bases.  Scattered rhonchi.  No rales appreciable.  No wheeze appreciable.  HEART:  Regular rhythm.  A 1/6 aortic outflow murmur.  No  S3, S4, or  gallops.  No heaves, thrills, or rubs.  ABDOMEN:  Soft and nontender.  Bowel sounds are normoactive.  No  guarding, rebound, or splenomegaly.  EXTREMITIES:  No clubbing, cyanosis, or edema.  NEUROLOGIC:  Cranial nerves II through XII are grossly intact.  The  patient moves all 4 extremities.   IMPRESSION:  As follows:  1. Right lower lobe infiltrate, day 5 of home therapy.  2. Generalized weakness and dyspnea.  3. Possible intravascular volume depletion.  4. Coronary artery disease, status post stenting.  5. Uncontrolled hypertension, blood pressure 192 systolic.  6. Hyperlipidemia.   The plan is to admit, continue Levaquin therapy 750 mg IV daily, DuoNeb  nebulizer q.4 h. p.r.n., aggressive pulmonary toilet, incentive  spirometry, and we will make further recommendations as the database  expands.      Melvyn Novas, MD  Electronically Signed     RMD/MEDQ  D:  08/07/2007  T:  08/08/2007  Job:  [id number]

## 2010-06-12 NOTE — Group Therapy Note (Signed)
NAME:  DEZERAE, FREIBERGER             ACCOUNT NO.:  [id number]   MEDICAL RECORD NO.:  [id number]          PATIENT TYPE:  INP   LOCATION:  A308                          FACILITY:  [facility]   PHYSICIAN:  Edward L. [name], M.D.DATE OF BIRTH:  April 05, 1928   DATE OF PROCEDURE:  08/10/2007  DATE OF DISCHARGE:                                 PROGRESS NOTE   Ms. Renda says she feels a little bit better this morning.  She has  less congestion, and she is coughing up a little bit of sputum.  Otherwise, she is about the same.  She has no other new complaints.  No  chest pain.   PHYSICAL EXAMINATION:  VITAL SIGNS:  Her temperature is 98.3, pulse 87,  respirations 20, blood pressure 162/92, and O2 saturation 96% on room  air.  CHEST:  Still shows rhonchi more on the left than on the right.  No  wheezing.  HEART:  Regular.  GENERAL:  She does look a little more comfortable.   ASSESSMENT:  She has chronic obstructive pulmonary disease, which is  stable.  She has pneumonia, which is improving.  She has coronary artery  occlusive disease, which is stable.  She has diabetes, doing okay.   PLAN:  To continue with current treatments and follow.      Edward L. [name], M.D.  Electronically Signed     ELH/MEDQ  D:  08/10/2007  T:  08/10/2007  Job:  [id number]

## 2010-06-12 NOTE — Assessment & Plan Note (Signed)
NAMEMarland Kitchen  Sheila, Sheila Myers              CHART#:  [id number]   DATE:  07/02/2006                       DOB:  [birth year]   PROBLEM LIST:  1. Epigastric abdominal pain.  2. External hemorrhoids.  3. Diverticulosis.  4. Patent Schatzki's ring.  5. Chronic back pain.  6. Anxiety.  7. Hyperlipidemia.  8. Hypertension.  9. Emphysema.   REFERRING PHYSICIAN:  Dr. Shaune Pollack.   SUBJECTIVE:  Sheila Myers is a 77 year old female who presents as a  return patient visit. She reports doing good since her last visit, but  she ate a salad last night and got chest pain and heartburn. She took an  extra Nexium and eventually her symptoms went away. She is not having  any hemorrhoid flares unless she gets constipated. She has been keeping  her bowels moving with fiber supplements. She has no other questions,  concerns, or complaints.   MEDICATIONS:  1. K-Dur.  2. Lasix.  3. Xanax as needed.  4. Imdur.  5. Darvocet.  6. Lotrel.  7. Citrucel as needed.  8. Nexium 40 mg daily.  9. Pravastatin.   OBJECTIVE:  VITAL SIGNS:  Weight 153 1/2 pounds, height 5 foot 3 inches,  temperature 97.8, blood pressure 162/88, pulse 64.  GENERAL:  She is no apparent distress, awake, alert, and oriented  x4.LUNGS:  Clear to auscultation bilaterally.  CARDIOVASCULAR:  Regular rhythm, no murmur, normal S1 and S2.ABDOMEN:  Bowel sounds are present. Soft and nontender, nondistended, no rebound  or guarding.   ASSESSMENT:  Sheila Myers is a 77 year old female with gastroesophageal  reflux disease whose symptoms are fairly well controlled. Her  constipation is fairly well controlled with Citrucel. Her hemorrhoids  are not bothering her currently. Thank you for allowing me to see Ms.  Myers in consultation. My recommendations follow.   RECOMMENDATIONS:  1. She should continue her Nexium.  2. She may use Citrucel as needed for constipation.  3. She has a follow up appointment to see me in six  months.       Kassie Mends, M.D.  Electronically Signed     SM/MEDQ  D:  07/02/2006  T:  07/03/2006  Job:  [id number]   cc:   Ramon Dredge L. [name], M.D.

## 2010-06-12 NOTE — Group Therapy Note (Signed)
NAME:  Sheila Myers, Sheila Myers             ACCOUNT NO.:  [id number]   MEDICAL RECORD NO.:  [id number]          PATIENT TYPE:  INP   LOCATION:  A308                          FACILITY:  [facility]   PHYSICIAN:  Edward L. [name], M.D.DATE OF BIRTH:  1929-01-14   DATE OF PROCEDURE:  DATE OF DISCHARGE:                                 PROGRESS NOTE   Problem is pneumonia, COPD, diabetes, coronary artery occlusive disease,  and hypertension.   SUBJECTIVE:  Ms. Tomlinson says she is not as good today.  She has a lot  of cough and congestion, and does not feel as well as she has.  She is  concerned.  She has had some chest discomfort, but she says it does not  feel like her heart problem and feels more like she has got  raw  trachea.  Otherwise, she was better yesterday.  She seemed to be  clearing her congestion.   OBJECTIVE:  VITAL SIGNS:  Her exam now shows her temperature is 98.3,  pulse 72, respirations 16, blood pressure 158/99, and her O2 sat 98% on  room air.  CHEST:  Clearer than it has been.  HEART:  Regular.  ABDOMEN:  Soft.   ASSESSMENT:  She does seem to be better.   PLAN:  To continue with her current medications and treatments.  I am  going to add some cough medication.  I am going to increase her blood  pressure medications because she does not seem to be totally controlled.  She has pneumonia, which I think is improving.  She does have COPD.  Otherwise, no changes in her treatments.      Edward L. [name], M.D.  Electronically Signed     ELH/MEDQ  D:  08/11/2007  T:  08/11/2007  Job:  [id number]

## 2010-06-12 NOTE — Group Therapy Note (Signed)
NAME:  Sheila Myers, Sheila Myers NO.:  [id number]   MEDICAL RECORD NO.:  [id number]           PATIENT TYPE:   LOCATION:                                 FACILITY:   PHYSICIAN:  Edward L. [name], M.D.DATE OF BIRTH:  [birth year]   DATE OF PROCEDURE:  DATE OF DISCHARGE:                                 PROGRESS NOTE   PROBLEMS:  1. Pneumonia.  2. Chronic obstructive pulmonary disease.  3. Coronary artery occlusive disease.  4. Anxiety.  5. Diabetes, diet-controlled.  6. Gastroesophageal reflux disease.  7. Hypertension.   SUBJECTIVE:  [name] says she is feeling better and wants to go  home today.  She has no new complaints.  Her exam shows that she looks  more comfortable.  Her temperature is 97.9, pulse 69, respirations 18,  blood pressure 139/69 and then 162/78.   ASSESSMENT:  She is better.   Plan is to try to get her up and home today.  Please see discharge  summary for details.      Edward L. [name], M.D.  Electronically Signed     ELH/MEDQ  D:  08/13/2007  T:  08/13/2007  Job:  [id number]

## 2010-06-12 NOTE — Group Therapy Note (Signed)
NAME:  RABIA, ARGOTE             ACCOUNT NO.:  [id number]   MEDICAL RECORD NO.:  [id number]          PATIENT TYPE:  INP   LOCATION:  A308                          FACILITY:  [facility]   PHYSICIAN:  Edward L. [name], M.D.DATE OF BIRTH:  December 19, 1928   DATE OF PROCEDURE:  DATE OF DISCHARGE:                                 PROGRESS NOTE   Ms. Goedken has been admitted with pneumonia, has a history of COPD,  very mild diabetes, coronary artery occlusive disease, chronic low back  pain, anxiety, and depression.  She says that she is feeling a little  bit better.  She is coughing some.  She had been concerned about her  blood pressure and told me that her blood pressure got as high as 315,  but there is no documentation of her blood pressure that is near that.  At any rate this morning, she says she is doing a little better.  She is  not quite as congested.  She is still coughing.  She is still wheezing.   Her physical examination shows her temperature is 98.1, pulse 64,  respirations 16, blood pressure 158/79, and O2 sats 100%.  Her chest is  clearer than it was.  She still has some wheezing and minimal rhonchi.  Her heart is regular without gallop.  Her white blood count is about the  same, still 15,000.   ASSESSMENT:  She has pneumonia.  She has asthma/COPD.  She has diabetes  which is stable.  She has coronary artery occlusive disease which is  stable.  She has hypertension.  She is back on her medications, but is  not totally controlled as yet, and my plan then is for her to continue  on her meds and treatments and follow.  I do not plan to change any  treatments today, and I am going to see if I could get her move around a  little bit.      Edward L. [name], M.D.  Electronically Signed     ELH/MEDQ  D:  08/09/2007  T:  08/09/2007  Job:  [id number]

## 2010-06-12 NOTE — Assessment & Plan Note (Signed)
NAME:  Sheila Myers, Sheila Myers              CHART#:  [id number]   DATE:  02/04/2007                       DOB:  1929-01-03   REFERRING PHYSICIAN:  Kari Baars, M.D.   PROBLEM LIST:  1. Hemorrhoids causing rectal bleeding.  2. Gastroesophageal reflux disease.  3. Constipation.   SUBJECTIVE:  Sheila Myers is a 77 year old female who presents as a  return patient visit.  She is complaining of cold and sinus symptoms.  She has received an antibiotic shot, cough syrup and cold medicine.  She  has been coughing secondary to mucus pooling in the back of her throat.  She denies any abdominal pain.  She is not having any reflux as long as  she takes her Nexium.  She is taking her Nexium before she eats.  She is  not having any rectal bleeding or constipation.  Greasy foods give her  diarrhea.   OBJECTIVE:  Weight:  151 pounds (down 2 pounds since June of 2008).  Height:  5'3.  BMI is 26.7 (overweight).  Temperature:  97.6.  Blood  pressure:  142/80.  Pulse:  80.  In general she is in no apparent  distress.  Alert and oriented x4.  LUNGS:  Clear to auscultation bilaterally.  CARDIOVASCULAR:  Regular rhythm, no murmur, normal S1 and S2.  ABDOMEN:  Bowel sounds are present.  Soft, nontender, nondistended.   ASSESSMENT:  Sheila Myers is a 77 year old female who is doing fairly  well.  She has had no rectal bleeding or problems with constipation.  Her gastroesophageal reflux disease is controlled.  Thank you for  allowing me to see Sheila Myers in consultation.  My recommendations  follow.   RECOMMENDATIONS:  1. Wrote a prescription for Sheila Myers to get a humidifier to      possibly help with her secretions.  2. She should continue her Nexium and Citrucel.  She may eliminate the      Citrucel if she has not had any problems with constipation.  3. She should continue her Nexium.  We will be more than happy to      provide refills as needed.  4. Follow up in twelve months or as  needed.       Kassie Mends, M.D.  Electronically Signed     SM/MEDQ  D:  02/04/2007  T:  02/04/2007  Job:  [id number]   cc:   Ramon Dredge L. [name], M.D.

## 2010-06-12 NOTE — Group Therapy Note (Signed)
NAME:  Sheila Myers, Sheila Myers             ACCOUNT NO.:  [id number]   MEDICAL RECORD NO.:  [id number]          PATIENT TYPE:  INP   LOCATION:  A308                          FACILITY:  [facility]   PHYSICIAN:  Edward L. [name], M.D.DATE OF BIRTH:  1928/09/27   DATE OF PROCEDURE:  DATE OF DISCHARGE:                                 PROGRESS NOTE   SUBJECTIVE:  Ms. Kirkey is overall a little better.  This morning,  she does not feel well.  She is complaining of a thrush in her mouth.  She is coughing a little bit more and she says she is a slightly more  short of breath.   PHYSICAL EXAMINATION:  VITAL SIGNS:  Today shows her temperature is  97.9, pulse 76, respirations 18, blood pressure 164/96, and O2 sats 99%.  CHEST:  Shows more wheezing than it did yesterday.  THROAT:  Her throat does show what looks like yeast.  HEART:  Regular.   ASSESSMENT:  She has pneumonia, which I think is getting better, but she  is not quite as good today so I am going to hold on her discharge.  She  has thrush in her mouth.  I am going to start Duke's mixture.  She has  hypertension, I had increased her Lotensin last night, but it is still  up a bit.  We will see what it does today after 24 hours on the full  dose.  She has coronary artery occlusive disease, which is stable with  no symptoms and she has chronic low back pain, which I think is  unchanged.   PLAN:  My plan then hold on discharge for the moment and have her back  to reevaluate in the morning and hopefully she can be discharged at that  point.      Edward L. [name], M.D.  Electronically Signed     ELH/MEDQ  D:  08/12/2007  T:  08/12/2007  Job:  [id number]

## 2010-06-12 NOTE — Group Therapy Note (Signed)
NAME:  Sheila Myers, Sheila Myers             ACCOUNT NO.:  [id number]   MEDICAL RECORD NO.:  [id number]          PATIENT TYPE:  INP   LOCATION:  A308                          FACILITY:  [facility]   PHYSICIAN:  Edward L. [name], M.D.DATE OF BIRTH:  16-Aug-1928   DATE OF PROCEDURE:  DATE OF DISCHARGE:                                 PROGRESS NOTE   Sheila Myers was admitted yesterday with what appears to be COPD  exacerbation.  She has pneumonia as well.  She says she is a little bit  better.  She is not coughing as much as she has been, but she still has  some cough and congestion.  Otherwise everything go back to same.  She  still coughing.  She still congested.   PHYSICAL EXAMINATION:  VITAL SIGNS:  Temperature is 97.4, pulse 68,  respirations 20, blood pressure 160/85, and her O2 sats 100%.  CHEST:  Clear, except for rhonchi.  No wheezing right now.   ASSESSMENT:  I think she is better, but she is still quite sick.   PLAN:  Continue with her current medications and treatments and follow.  I have told her that we will take her sometime to clear this pneumonia.  We will recheck CBC in the morning.  I am going to put her on Lovenox  and put her on the regular medications in home as well.  As best I can  tell she did not get blood cultures done, but she is already been on IV  antibiotics and I do not see much point in doing them now.      Edward L. [name], M.D.  Electronically Signed     ELH/MEDQ  D:  08/08/2007  T:  08/08/2007  Job:  [id number]

## 2010-06-15 NOTE — Cardiovascular Report (Signed)
[place]  Patient:    Sheila Myers, Sheila Myers                    MRN: [id number] Adm. Date:  [id number] Attending:  Alric Quan CC:         Kari Baars, M.D.             Thomas C. Wall, M.D. [facility]             Cardiac Catheterization Lab                        Cardiac Catheterization  INDICATIONS:  The patient is a 77 year old black female with a history of coronary disease with catheterization 18 years ago that we do not have any records or angiograms to review.  The patient apparently was well until two weeks ago when she started having exertional angina.  PROCEDURE:  Selective coronary angiography, left ventricular angiography, lima angiography-Judkins technique.  RESULTS:  Pressures:  LV systolic 157, diastolic 17; aortic 157, diastolic 70.  Angiography: 1. The left main coronary was normal. 2. The left anterior descending revealed a 50% lesion in the diagonal and a    70-90% LAD lesion after the second diagonal. 3. The circumflex revealed a focal 95% lesion proximally; otherwise, was    normal. 4. The right coronary artery revealed a 30% lesion proximally, 30-50% distal    RCA, then a subtotal lesion in the posterolateral branch with TIMI-1 flow. 5. The left ventricle revealed focal hypokinesis in the mid inferior wall. 6. The LIMA was patent.  SUMMARY:  The patient with severe three-vessel coronary artery disease as noted above, well-preserved LV function with focal hypokinesis in the mid inferior wall.  RECOMMENDATION:  Cines were reviewed with Dr. Riley Kill.  It was difficult to tell which is the culprit lesion, either the circumflex or the distal RCA. Dr. Riley Kill feels that we should start her on Integrilin and Plavix and reassess her concerning timing of her percutaneous intervention.  The patient did have some mild prolonged chest discomfort during and after the procedure. She has not had EKG changes during the chest  discomfort. DD:  04/25/99 TD:  04/27/99 Job: [id number]

## 2010-06-15 NOTE — H&P (Signed)
NAME:  Sheila Myers, Sheila Myers                       ACCOUNT NO.:  [id number]   MEDICAL RECORD NO.:  [id number]                   PATIENT TYPE:  INP   LOCATION:  2310                                 FACILITY:  [facility]   PHYSICIAN:  [name], M.D.                DATE OF BIRTH:  11-Sep-1928   DATE OF ADMISSION:  11/08/2002  DATE OF DISCHARGE:                                HISTORY & PHYSICAL   CHIEF COMPLAINT:  Awoke with chest pain and nausea at about 2 a.m. today.   HISTORY OF PRESENT ILLNESS:  Sheila Myers is a very pleasant 76 year old  African-American female with known coronary artery disease.  She has had a  previous stent to the circumflex and posterolateral artery in March, 2001  with a 70% in-stent circumflex and 50% PDA at the PCTA site by repeat cath  last in June, 2001.  Her last admission to [facility] was in March,  2003 with atypical chest pain.  She had normal cardiac enzymes.  Adenosine  Cardiolite showed no ischemia.   A couple of days ago she had some nausea and vomiting.  Otherwise she has  felt well until she awoke at about 2 a.m. with some discomfort in the middle  of her chest, eight out of ten.  Did not radiate.  She had some mild  shortness of breath and some sweatiness.  She got a little nauseated but did  not vomit.  She did not take a nitroglycerin.   Today she said she was nauseated all morning.  She went to see Dr. [name]  who brought her to the emergency room in [facility].  She is now transferred  here for further evaluation.   PAST MEDICAL HISTORY:  1. She is intolerant of CODEINE, NORVASC AND MORPHINE SULFATE.  She also     became severely bradycardic on VERAPAMIL back in 2001 which we had to     discontinue.  Fortunately she did not need a pacemaker.  2. Hypertension.  3. She was once an insulin-dependent diabetic but now is off insulin having     followed her diet and lost some weight.  4. Hyperlipidemia.   SURGERIES:   Cholecystectomy and appendectomy, and a T&A.   SOCIAL HISTORY:  She lives in [place].  She does not smoke or drink.   FAMILY HISTORY:  Her mother had coronary artery disease.   REVIEW OF SYSTEMS:  She denies any hematemesis, increased reflux (for which  she takes Prevacid), and no melena.  Her bowel movements have been normal  over the last several days.   MEDICATIONS:  1. Prevacid 30 mg daily.  2. Lasix 20 mg daily.  3. Xanax 0.5 q.h.s.  4. Potassium 10 mEq daily.  5. Vitamin B6.  6. Aspirin 81 mg daily.  7. Lotensin unknown dose daily.   PHYSICAL EXAMINATION:  GENERAL:  She is in no acute distress.  She still has  some residual soreness in her chest to touch.  VITAL SIGNS:  Blood pressure is 185/75, heart rate is 80 in sinus rhythm,  respiratory rate is 14 and unlabored, her O2 saturation is 99%.  Nitroglycerin and IV heparin are running.  SKIN:  Warm and dry.  HEENT:  Exam is unremarkable.  NECK:  Carotid upstrokes are equal bilaterally without bruits, there is no  jugular venous distension.  CARDIAC:  Exam reveals a regular rate and rhythm without murmur, rub, or  gallop.  LUNGS:  Clear to auscultation and percussion.  She has some mild lower  sternal tenderness to touch.  ABDOMEN:  Soft with good bowel sounds, there is no midline bruit, there is  no epigastric tenderness, no hepatic tenderness or hepatomegaly.  EXTREMITIES:  Reveal good pulses bilaterally.  She has soft bilateral  femoral bruits.  There is no edema.  NEUROLOGIC:  Exam is intact.   Chest x-ray shows no acute cardiopulmonary disease.  She has mild  cardiomegaly.  EKG shows sinus rhythm with nonspecific ST segment changes.   LABORATORY DATA:  (In [place])  Hemoglobin 13.4, a potassium of 3.0, normal CPK and MB, negative troponin  x1, and normal liver function tests.   ASSESSMENT AND PLAN:  1. Atypical chest pain in a lady with known coronary artery disease.  If her     enzymes are negative we  will proceed with adenosine Cardiolite.  We will     only catheterize her if she has significant ischemia.  2. Hypertension.  She does not know the dose of her Lotensin, however, we     will treat this with intravenous nitroglycerin this evening and also a     combination of Lotensin and hydrochlorothiazide.  She says her pressure     has been under control at home however.  3. Diabetes currently not on insulin.  4. Hyperlipidemia.  She is currently not on a [place].  When asked, she was     on Lipitor in the past.  Will restart her on 10 mg of Lipitor daily.  5. Chronic pulmonary obstructive disease.  6. History of junctional bradycardia on Verapamil.  7.     Gastroesophageal reflux disease.  8. Hypokalemia.  9. Mild increase in WBCs.                                                  Thomas C. Wall, M.D.    TCW/MEDQ  D:  11/08/2002  T:  11/09/2002  Job:  [id number]   cc:   Ramon Dredge L. [name], M.D.  [address]  [place]  [place] [address]  Fax: [phone]

## 2010-06-15 NOTE — Op Note (Signed)
NAMELAWRIE, TUNKS             ACCOUNT NO.:  [id number]   MEDICAL RECORD NO.:  [id number]          PATIENT TYPE:  AMB   LOCATION:  DAY                           FACILITY:  [facility]   PHYSICIAN:  Kassie Mends, M.D.      DATE OF BIRTH:  [birth year]   DATE OF PROCEDURE:  11/11/2005  DATE OF DISCHARGE:  11/11/2005                                 OPERATIVE REPORT   REFERRING PHYSICIAN:  Dr. [name].   PROCEDURE:  1. Colonoscopy.  2. Esophagogastroduodenoscopy.   INDICATION FOR EXAMINATION:  Ms. Brunette is a 77 year old female who  presents with new onset epigastric abdominal pain.  She also presents for  average-risk colon cancer screening.   FINDINGS:  1. Diverticulosis.  External hemorrhoids.  Otherwise no polyps, masses,      inflammatory changes or internal hemorrhoids seen.  2. Patent Schatzki's ring.  A 2- to 3-cm hiatal hernia.  Otherwise normal      esophagus without evidence of Barrett's.  Normal pylorus and duodenum.   RECOMMENDATIONS:  1. Screening colonoscopy in 10 years.  Handout given on high-fiber diet,      and diverticulosis.  She is to follow the recommendations of a high-      fiber diet.  2. She is given a referral to see Dr. Franky Macho in regards to surgical      management for her external hemorrhoid which is large.  3. She may follow up with me in 4 to 6 weeks.   PROCEDURE TECHNIQUE:  Physical exam was performed, and informed consent was  obtained from the patient after explaining the benefits, risks and  alternatives to the procedure.  The patient was connected to the monitor and  placed in the left lateral position.  Continuous oxygen was provided by  nasal cannula and IV medicine administered through an indwelling cannula.  After administration of sedation and rectal exam, the patient's rectum was  intubated.  The scope was passed under direct visualization to the cecum.  The scope was subsequently removed slowly by carefully examining the color,  texture, anatomy and integrity of the mucosa on the way out.   After the colonoscopy, the patient's esophagus was intubated with a  gastroscope.  The scope was passed under direct visualization to the second  portion of the duodenum.  The scope was subsequently removed slowly by  carefully examining the color, texture, anatomy, and integrity of the mucosa  on the way out.  The patient was recovered in the endoscopy suite and  discharged home in satisfactory condition.      Kassie Mends, M.D.  Electronically Signed     SM/MEDQ  D:  11/12/2005  T:  11/13/2005  Job:  [id number]   cc:   Ramon Dredge L. [name], M.D.  Fax: [phone]

## 2010-06-15 NOTE — Discharge Summary (Signed)
[place]. [facility]  Patient:    Sheila Myers, Sheila Myers                    MRN: [id number] Adm. Date:  [id number] Disc. Date: 07/27/99 Attending:  Mirian Mo Dictator:   Abelino Derrick, P.A.-C. [facility] CC:         Kari Baars, M.D. - [address], [place], [place] [address]                           Discharge Summary  DISCHARGE DIAGNOSES: 1. Unstable angina. 2. Coronary artery disease with a history of circumflex and posterior    lateral artery intervention in March 2001, with a cardiac catheterization    this admission revealing a 70% restenosis of the prior stent site, 80%    left anterior descending coronary artery and midvessel, 80% and 70% distal    right coronary artery disease with an ejection fraction of 59%. 3. Transient junctional rhythm and bradycardia, improved at discharge. 4. Hypertension, controlled. 5. Hyperlipidemia. 6. Asthmatic chronic obstructive pulmonary disease.  HISTORY OF PRESENT ILLNESS:  The patient is a 77 year old female followed by Dr. Maisie Fus C. Wall and Dr. Kari Baars, who has coronary artery disease. She a stenting of the circumflex and PLA in March 2001.  At that time she had normal V function and some residual disease in the LAD.  She had had a temporary pacer placed for bradycardia, felt to be secondary to ischemia.  She had done well until she was admitted on July 24, 1999, with chest pain consistent with unstable angina. She was transferred from [facility] for further evaluation.  Please see the admission history and physical for complete details.  She did have some atrial fibrillation and junctional rhythm.  HOSPITAL COURSE:  She was sent for a cardiac catheterization which was done on n emergency basis on July 24, 1999, by Dr. Arturo Morton. Stuckey.  This revealed findings as noted above.  Dr. Riley Kill did not feel that there was an obvious source of symptoms, based on her anatomy.  A temporary pacer  was placed.  Her Verapamil was held.  She maintained sinus rhythm after this, and her temporary pacer was pulled late on July 24, 1999.  The plan for now is for continued medical therapy.  She was transferred to the floor and ambulated.  Norvasc was added.  DISPOSITION:  Dr. Daleen Squibb feels she can be discharged late on July 27, 1999, if she has no further chest pain or arrhythmia.  DISCHARGE MEDICATIONS: 1. Norvasc 10 mg q.d. 2. Lasix 40 mg q.d. 3. Altace 5 mg b.i.d. 4. Prevacid 30 mg q.d. 5. Baycol 0.4 mg q.d. 6. Lasix 30 mEq q.d. 7. An aspirin q.d. 8. Imdur 60 mg q.d.  LABORATORY DATA:  Renal function shows a sodium of 138, potassium 3.8, BUN 15, creatinine 0.7.  CPK-MB and troponins negative x 3.  Electrocardiogram reveals normal sinus rhythm on July 24, 1999, inferior Qs.  LABORATORY DATA:  From [facility] reveal a white count of 10.5, hemoglobin 12.9, hematocrit 39, troponin negative there.  INR 1.43.  CONDITION ON DISCHARGE:  The patient is discharged in stable condition and will  follow up with Dr. Daleen Squibb in two weeks in [place]. DD:  07/27/99 TD:  07/27/99 Job: [id number]

## 2010-06-15 NOTE — Discharge Summary (Signed)
NAME:  Sheila Myers, BARK NO.:  [id number]   MEDICAL RECORD NO.:  [id number]                   PATIENT TYPE:  INP   LOCATION:  5148                                 FACILITY:  [facility]   PHYSICIAN:  Willa Rough, M.D.                  DATE OF BIRTH:  [birth year]   DATE OF ADMISSION:  11/08/2002  DATE OF DISCHARGE:  11/10/2002                           DISCHARGE SUMMARY - REFERRING   DISCHARGE DIAGNOSES:  1. Chest pain felt to be noncardiac in nature.  2. Known coronary artery disease with history of stent placement to the     circumflex and proximal left anterior (PLA) in March of 2001.  3. Diarrhea, resolved.  4. Diabetes mellitus.  5. Hypertension, treated.  6. Hyperlipidemia, treated.  7. Chronic obstructive pulmonary disease.   HOSPITAL COURSE:  [name] is a 77 year old female patient with a known  history of coronary artery disease who underwent a stent placement to the  circumflex and PLA in March of 2001 with a 70% in stent circumflex and a 50%  PLA re-stenosis by repeat cath in June of 2001.  In March of 2003, she was  admitted with atypical chest pain and was found to have normal enzymes as  well as a normal Adenosine Cardiolite.  She is now transferred from [facility] with chest pain and initial cardiac markers that are negative.  During her hospitalization at [facility], she underwent lab studies which  revealed a hemoglobin of 11.7, hematocrit 36.1, platelets 238.  Sodium 138,  potassium 3.5, BUN 8, creatinine 0.9, lipase 19.  Cardiac isoenzymes x2  negative.  Total cholesterol 167, triglycerides 77, HDL 51, LDL 102.  TSH  1.328.  EKG revealed normal sinus rhythm, rate 74, no acute ST-T wave  changes.  Chest x-ray was stable with no active disease.  The patient then  underwent an adenosine Cardiolite which revealed no ischemia with an EF of  70%.  She did continued to have diarrhea and we kept her overnight to  rehydrate  her.  The diarrhea was resolved over the next day and she was  ready for discharge to home with these instructions.   She is to continue her same medications as prior to admission:  1. Prevacid 30 mg a day.  2. Lasix 20 mg a day.  3. Xanax 0.5 mg q.h.s.  4. Potassium 10 mEq a day.  5. Vitamin B12 daily.  6. Baby aspirin daily.  7. Lotensin 20 mg a day/HCTZ.  8. Zocor 40 mg q.h.s.  9. We did ask her to add Protonix 40 mg a day and if this was too expensive     she may take two Pepcid on a daily basis.   Remain on a low-fat, low-salt and cholesterol diabetic diet.  She needs to  call Dr. [name] for a follow up appointment within two to three weeks,  sooner if  diarrhea persists.  She has an appointment with Dr. Daleen Squibb on  December 08, 2002, at 2 p.m.      Guy Franco, P.A. [facility]                      Willa Rough, M.D.    LB/MEDQ  D:  12/02/2002  T:  12/02/2002  Job:  [id number]   cc:   Thomas C. Wall, M.D.   Edward L. [name], M.D.  [address]  [place]  [place] [address]  Fax: [phone]

## 2010-06-15 NOTE — Procedures (Signed)
[place]. [facility]  Patient:    Sheila Myers, Sheila Myers                    MRN: [id number] Proc. Date: 04/27/99 Adm. Date:  [id number] Attending:  Alric Quan CC:         Kari Baars, M.D.             Thomas C. Wall, M.D. [facility]             Cardiopulmonary Laboratory                           Procedure Report  PROCEDURE:  Percutaneous transluminal coronary angioplasty and stent implantation.  CARDIOLOGISTEverardo Beals Juanda Chance, M.D.  INDICATIONS:  Sheila Myers is 77 years old and has no prior history of heart disease, and was recently admitted with unstable angina and underwent diagnostic catheterization two days earlier and was found to have a 99% stenosis of a posterolateral branch of the right coronary artery and a 90% stenosis in the proximal circumflex coronary artery, and a 70% stenosis in the proximal LAD. After reviewing the films, we elected to perform intervention on the posterolateral branch of the right coronary artery and the circumflex artery.  She developed junctional bradycardia last night, probably related to Cardizem and beta blockers and Dr. Arturo Morton. Stuckey put in a temporary transvenous pacemaker via the left  femoral vein.  DESCRIPTION OF PROCEDURE:  The procedure was performed via the right femoral artery and arterial sheath and a 7-French 3.5 Voda guiding catheter with side holes. he patient had been on an Integrilin drip and was given weight-adjusted heparin to  prolong the ACT to greater than 200 seconds.  We used a luge wire and crossed the lesion in the circumflex artery without difficulty.  We direct stented with an 8.0 mm x 3.0 mm NIR/SOX stent, performing one inflation of 15 atmospheres for 53 seconds.  We then post-dilated with a 3.25 mm Quantum Ranger Monorail, with one  inflation of 20 atmospheres for 46 seconds.  There was still a very slight indentation of the balloon even at 20 atmospheres.  Repeat  diagnostic studies were performed through the guiding catheter.  We then approached the right coronary artery and used a JR4, 7-French guiding catheter with side holes and the same luge wire.  We could not cross with the luge wire and we switched to a Graphics PT wire and were able to cross the lesion in the posterolateral branch which was almost completely obstructed.  We used a 2.25 mm x 20.0 mm Adante balloon and performed a total of three inflations of 9 atmospheres for 80 seconds.  Repeat diagnostic studies were then performed through the guiding catheter.  We did not quite get full expansion of the balloon with the 9 atmosphere inflation.  The patient tolerated the procedure well and left the laboratory in satisfactory condition.  RESULTS:  Initially the stenosis in the circumflex artery was estimated 90%. Following stenting this improved to 10%.  The stenosis in the posterolateral branch was 99% with TIMI-1 flow.  Following the PTCA this improved to 40% with TIMI-3 flow.  CONCLUSION: 1. Successful stenting of the proximal circumflex coronary artery, with    improvement in the percent diameter narrowing from 90% to 10%. 2. Successful percutaneous transluminal coronary angioplasty of the posterolateral    branch of the right coronary artery, with improvement in the percent  diameter narrowing from 99% to 40%, and improvement in the flow from TIMI-1    to TIMI-3 flow.  DISPOSITION:  The patient was transferred to the post-angioplasty unit for further observation. DD:  04/27/99 TD:  04/28/99 Job: [id number]

## 2010-06-15 NOTE — H&P (Signed)
NAME:  Sheila, Myers NO.:  [id number]   MEDICAL RECORD NO.:  [id number]          PATIENT TYPE:  EMS   LOCATION:  ED                            FACILITY:  [facility]   PHYSICIAN:  Mobolaji B. Bakare, M.D.DATE OF BIRTH:  1929/01/24   DATE OF ADMISSION:  11/13/2003  DATE OF DISCHARGE:  LH                                HISTORY & PHYSICAL   Primary care physician:  [name]. [name], M.D.  Cardiologist:  [name], M.D.   CHIEF COMPLAINT:  Nausea and vomiting with abdominal pain in the last four  days.   HISTORY OF PRESENTING COMPLAINT:  Sheila Myers is a pleasant 77 year old  African-American female with significant past medical history of CAD, status  post stent placement in 2001, hypertension, hyperlipidemia.  She was in her  usual state of health until four days ago, when she developed nausea;  however, there was no vomiting at that point.  She felt sick to her stomach.  Yesterday she developed chills and fever.  Fever seems to be low-grade.  Temperature was about 99.6.  She started vomiting today, associated with  four episodes of diarrhea.  Since she has been in the emergency room, she  has moved her bowels twice, watery stool, and she also developed left lower  quadrant abdominal pain.  On arrival, pain was about 8/10 in severity and no  relieving factors or aggravating factors.  CT scan of the abdomen done in ER  showed mild diverticulitis involving the sigmoid and descending colon.   REVIEW OF SYSTEMS:  Positive for epigastric discomfort and shortness of  breath.  There is no clear-cut chest pain.  No palpitation, no diaphoresis,  and she denies dysuria, urgency, or increase in frequency of micturition.  She denies cough, orthopnea, PND, or pedal edema.  She experienced headaches  and vertigo during the course of this illness.  She was recently treated for  acute bronchitis from asthma last Monday, and she completed a course of  prednisone on  Thursday.   PAST MEDICAL HISTORY:  1.  CAD with stent to the circumflex and posterolateral arteries in March      2001.  She has been followed up by Dr. Daleen Squibb.  She had a Cardiolite      stress test in October 2004, which was negative for any ischemia.  2.  Diabetes mellitus.  She was initially on insulin, and she has lost some      weight and following that diet hence she is off medications for diabetes      at this point.  3.  Hyperlipidemia.  4.  Asthma/bronchitis.  The patient does not have a pulmonologist; however,      she denies any history of smoking in the past.  5.  Hypertension.  6.  Ruptured disk.   PAST SURGICAL HISTORY:  1.  Cholecystectomy.  2.  Appendectomy.   MEDICATIONS:  1.  Lipitor 10 mg p.o. daily.  2.  Potassium chloride 30 mEq p.o. daily.  3.  Nitroglycerin 0.4 mg p.r.n.  4.  Prevacid 30 mg p.o. daily.  5.  Xanax 0.5 mg p.r.n.  6.  Hydrocodone/acetaminophen p.r.n.  7.  Torsemide 20 mg daily, which she uses on a regular basis for fluid      overload.   Please note that her ejection fraction is 73% in October 2004.   SOCIAL HISTORY:  She lives with her son, and she is independent of  activities of daily living.  She does not smoke cigarettes, she never did,  and does not drink alcohol.   FAMILY HISTORY:  Mom had coronary artery disease.  Otherwise family history  is noncontributory.   PHYSICAL EXAMINATION:  VITAL SIGNS ON ARRIVAL IN THE EMERGENCY DEPARTMENT:  Temperature of 97.5, blood pressure 141/76, pulse of 86, respiratory rate of  20, O2 saturation of 97% on room air.  GENERAL:  She is alert and oriented in time, place, and person.  HEENT:  Normocephalic, atraumatic head.  Pupils are equal, round, and  reactive to light.  Extraocular muscle movements intact.  Mucous membranes  dry.  No oral thrush.  No tonsillar exudate.  NECK:  No carotid bruit.  No cervical lymphadenopathy.  No raised JVD.  LUNGS:  Vesicular breath sounds, no wheeze, no  crackle.  CARDIOVASCULAR:  S1, S2 regular.  No gallop, no murmur.  ABDOMEN:  Obese, soft, tender in the left lower quadrant.  No rebound  tenderness.  No guarding.  Bowel sounds present.  No holosystolic murmur.  RECTAL:  Done in the emergency department, no remarkable findings.  EXTREMITIES:  No pedal edema, no calf tenderness, no cyanosis.  SKIN:  No rash, no petechiae.  CENTRAL NERVOUS SYSTEM:  No focal neurologic deficit.   INITIAL LABORATORY DATA:  White cell count of 18.9, hematocrit of 44.9, MCV  68.9, platelets 301, neutrophils 72, lymphocytes 21, monocytes 6%.  Sodium  136, potassium 3.5, chloride 100, bicarb 24, glucose 152, BUN 22, creatinine  1.5, bilirubin 1.5, alkaline phosphatase 83, SGOT 35, SGPT 40, total protein  6.8, albumin 3.7, calcium 9.0.  Lipase 29.  UA:  Specific gravity 1.004, pH  of 5.0, negative for nitrite and trace for leukocytes.  Microscopy:  Many  bacteria, squamous epithelial cells.  CT scan abdomen and pelvis:  Mild  diverticulitis in sigmoid and descending colon, pericardial thickening of  uncertain etiology.   ASSESSMENT AND PLAN:  [name] is a 77 year old African-American female  with multiple medical problems, which all appear to be under control,  however now presenting with nausea, vomiting, fever, chills, leukocytosis,  and left lower quadrant pain.  CT scan abdomen suggestive of mild  diverticulitis.   1.  Diverticulitis involving sigmoid and descending colon.  NPO for now      except medications.  IV fluids normal saline plus 30 mEq potassium      chloride at 125 mL/hr.  Ciprofloxacin 400 mg IV q.12h.  Flagyl 500 mg IV      q.6h.  Phenergan 12.5 mg IV q.4h. p.r.n.  Dilaudid 1 mg IV q.4h. p.r.n.      Will check stool for fecal leukocyte culture and Clostridium difficile.      Protonix 40 mg IV daily.  2.  History of coronary artery disease.  The patient did have an episode of     epigastric discomfort and shortness of breath but no  chest pain.  Will      check EKG and cardiac enzymes x1.  3.  Diabetes mellitus, diet-controlled.  Will check hemoglobin A1C and use      insulin-sensitive sliding scale.  4.  Azotemia.  Will rehydrate.  Hopefully this will improve.  5.  Asymptomatic bacteriuria.  The patient is already on antibiotics.  6.  Gastroesophageal reflux disease.  Use Protonix IV.  7.  History of asthma, history of bronchitis.  Continue with Combivent      inhaler q.i.d. and albuterol nebulizer p.r.n.  8.  Deep vein thrombosis prophylaxis.  Will use Lovenox subcu.  9.  Hyperlipidemia.  Hold Lipitor until the patient can tolerate orally and      start eating.  10. Mild leukocytosis and increased RDW.  This is suggestive of iron-      deficiency anemia.  However, the patient's hematocrit is within normal.      I suspect this may be due to hemoconcentration.  Will go ahead and      obtain iron studies.  Of note is that the patient's hematocrit in      October 2004 was 36.1 and hemoglobin of 11.7.  11. History of junctional bradycardia, on verapamil.  12. Hypertension.  Will continue with present medication and adjust as      needed.      MBB/MEDQ  D:  11/13/2003  T:  11/14/2003  Job:  [id number]   cc:   Ramon Dredge L. [name], M.D.  [address]  [place]  [place] [address]  Fax: [phone]   [name], M.D.

## 2010-06-15 NOTE — Discharge Summary (Signed)
[place]. [facility]  Patient:    Sheila Myers, Sheila Myers                    MRN: [id number] Adm. Date:  [id number] Disc. Date: 04/30/99 Attending:  Alric Quan Dictator:   Leonides Cave, P.A. CC:         Jesse Sans. Daleen Squibb, M.D. [facility], [facility], [place]             Kari Baars, M.D., [place]                           Discharge Summary  DATE OF BIRTH:  Mar 12, 1928  DISCHARGE DIAGNOSES: 1. Three-vessel coronary artery disease status post percutaneous    intervention of the left circumflex and posterolateral artery and    right coronary artery. 2. Temporary pacer for several days during hospital stay due to bradycardia    (ischemia and medication induced). 3. Hypertension. 4. Gastroesophageal reflux disease. 5. Asthma.  BRIEF HISTORY:  The patient is a 77 year old female with a remote history of coronary artery disease by catheterization greater than 18 years ago who presents with a two to three-week history of exertional chest tightness.  The patient states her episodes of chest tightness occur with activity and are relieved with rest of 5 to 10 minutes.  She does have associated dyspnea, left upper extremity numbness, and also reports and increase in dyspnea on exertion and two-pillow orthopnea and PND recently.  The patient was seen on April 23, 1999, by Rozell Searing, P.A.C., and Juanito Doom, M.D.  She was admitted to the short stay on April 25, 1999, for cardiac catheterization.  HOSPITAL COURSE:  The patient was taken to the catheterization lab on April 25, 1999, by Dr. Glennon Hamilton.  Results are left main normal; LAD with a 70 to 90% proximal to mid lesion; circumflex had a 95% lesion proximally; RCA with a 99% subtotal posterolateral with a TIMI-1 flow.  With the anatomy as outlined above, the culprit lesion could not be determined.  The patient had continued mild chest discomfort, so she was kept on Plavix and Integrilin.   She was set up for percutaneous intervention at a later date and kept in the critical care unit.  On April 26, 1999, the patient began having heart rates in the 30s and 40s which was not responsive to atropine.  Therefore, Dr. Riley Kill decided to start dopamine and place an emergency temporary pacer on April 26, 1999, for drug-induced junctional rhythm with associated hypotension.  The patient tolerated this very well.  The patient went back to the catheterization lab on April 27, 1999, by Dr. Charlies Constable.  Dr. Juanda Chance proceeded to place a stent to the patients right circumflex, reducing the 90% lesion to 10% residually and PTCA of the posterolateral branch, reducing the 99% lesion to 40% residually. The patient tolerated this procedure very well as well.  The patients temporary pacer was discontinued on April 28, 1999, and the patient continued to improve.  She was subsequently discharged home on April 30, 1999, in stable condition.  DISCHARGE MEDICATIONS:   K-Dur 20 mEq q.d.,  Lasix reduced to 20 mg q.d. from 40 mg q.d.  She was also given prescription for Imdur 60 mg q.d, Norvasc 5 mg q.d., Plavix 75 mg x 4 weeks, coated aspirin q.d., Altace 7.5 mg q.d., and nitroglycerin p.r.n.  She will continue Xanax 0.5 b.i.d. and Prevacid 30  mg q.d.  She was told to discontinue her Verelan.  DIET:  She will continue a diabetic diet.  FOLLOWUP:  With Dr. Juanito Doom in [place] on April 17 at 12:15 p.m. DD:  04/30/99 TD:  04/30/99 Job: [id number]

## 2010-06-15 NOTE — Cardiovascular Report (Signed)
[place]. [facility]  Patient:    Sheila Myers, Sheila Myers                    MRN: [id number] Proc. Date: 07/24/99 Adm. Date:  [id number] Attending:  Mirian Mo CC:         Kari Baars, M.D.             Thomas C. Wall, M.D. [facility]             CV Laboratory                        Cardiac Catheterization  INDICATIONS:  Sheila Myers is a delightful 77 year old female who has presented with recurrent chest pain.  On her previous admission she had junctional rhythm associated with both verapamil and beta blockers and had temporary transvenous pacer placed.  She has continued on low-dose verapamil. At that time she also had a coronary angiogram and had dilatation of the posterolateral branch and dilatation of the mid circumflex.  She has remained stable but developed chest pain yesterday.  She was seen in the [facility] Emergency Room and brought back in for repeat catheterization today.  Of note, she is now back again in junctional rhythm or possibly atrial fibrillation with slow response.  She has not taken her verapamil in over 24 hours.  She is no longer on beta blockade.  PROCEDURES: 1. Left heart catheterization. 2. Selective coronary angiography. 3. Selective left ventriculography. 4. Insertion of temporary transvenous pacer.  DESCRIPTION OF PROCEDURE:  The patient was brought to the catheterization lab and procedure was prepped and draped in the usual fashion.  Through an anterior puncture, the right femoral vein was entered and a 6 French sheath was placed.  The artery was then entered and a 6 French sheath was also placed.  Views of the left and right coronary arteries were obtained in multiple angiographic projections.  Ventriculography was performed in the RAO projection.  I then discussed her case in detail with Dr. Juanito Doom and we elected to place a temporary transvenous pacer because of rates down into the 30s.  It was felt that she would  likely need permanent pacing.  She also has re-stenosis in her previous stent site with about 70% narrowing, but this is smooth and does not appear threatening.  The previous site of PTCA of the posterolateral branches remains patent with some narrowing but no flow limitation.  There is a fairly high-grade lesion in the mid left anterior descending artery, but this was also noted on the previous angiogram and I am not sure accounts for the current symptoms.  Based on all of this, the decision was made to put in a temporary pacer and pull the arterial sheath with plans for possible permanent pacing.  She may then eventually need elective PCI.  The temporary transvenous pacer was then placed through the right femoral vein.  A 5 French pacing wire was passed and was placed through the RV apex.  Pacing was tested and was found to be appropriate.  It was sewn into place carefully.  The patient was then taken to the holding area in satisfactory condition.  HEMODYNAMICS:  The central aortic pressure was 164/68. LV pressure 141/15. No gradient on pullback across the aortic valve.  ANGIOGRAPHIC DATA:  VENTRICULOGRAPHY:  Ventriculography in the RAO projection reveals preserved global systolic function.  No segmental abnormalities or contraction are identified.  Wall motion  is normal.  Ejection fraction was calculated at 59%.  The left main coronary artery was free of critical disease.  The left anterior descending artery courses to the apex.  There is about 30% narrowing beyond the origin of the first diagonal.  Beyond the second diagonal was a 75-80% slightly hazy stenosis similar to on the previous angiogram.  The distal vessel has mild diffuse irregularity.  The first diagonal branch has about 50% narrowing.  The circumflex at the previous stent site has about 70% narrowing.  This leads to three marginal branches, all of which are free of critical disease.  The right coronary artery has  diffuse luminal irregularities.  There is about a 50% lesion prior to the origin of the PDA.  The posterior descending artery has a 70% distal stenosis.  The first posterolateral artery at the site of old angioplasty has about a 50% eccentric narrowing, but does not appear to be greatly stenosed.  Distal to this is an 80% diffuse stenosis that was present after the percutaneous intervention.  The posterolateral branch is free of critical disease.  CONCLUSIONS: 1. Preserved overall left ventricular function. 2. Moderately high-grade stenosis of the mid left anterior descending artery. 3. Partial early re-stenosis of the circumflex stent. 4. No evidence of high-grade re-stenosis in the posterolateral branch.  DISPOSITION:  I have discussed her case in detail with Dr. Daleen Squibb.  I do believe she will need permanent pacemaker implantation.  I also think she will likely need elective percutaneous coronary intervention.  Her current pain is not clearly explained by her findings.  We will get multiple opinions. DD:  07/24/99 TD:  07/25/99 Job: [id number]

## 2010-06-15 NOTE — H&P (Signed)
[place]. [facility]  Patient:    Sheila Myers, Sheila Myers Visit Number: [id number] MRN: [id number]          Service Type: EMS Location: ED Attending Physician:  Hilario Quarry Dictated by:   Rollene Rotunda, M.D. [facility] Admit Date:  04/25/2001 Discharge Date: 04/25/2001   CC:         Kari Baars, M.D.  Thomas C. Wall, M.D. [facility]   History and Physical  DATE OF BIRTH: 1929-01-25  PRIMARY CARE DOCTOR: Kari Baars, M.D.  CARDIOLOGIST: [name], M.D.  CHIEF COMPLAINT: The patient is a 77 year old African-American female, with coronary disease as described below.  She has had no significant problems since her last catheterization until approximately one week ago.  At that time she noticed her heart pounding with activity such as walking to the mailbox. One week ago she noticed increasing shortness of breath, which was thought possibly to be asthma.  She was treated with prednisone.  Three days prior to admission while "picking up" in her yard she felt light headed.  Yesterday she had some mild chills and cold, and slight difficulty with her gait.  She woke at 4 a.m. with recurrent palpitations and chest discomfort.  She reports a substernal and right-sided discomfort with an "electric shock" going to her right shoulder.  This was 7/10 in intensity.  She was having hot and cold chills.  She felt slightly nauseated.  She did take some nitroglycerin but this did not apparently make a different.  She was seen in the [facility] Emergency Room, where she was given IV nitroglycerin, again without documented improvement in her symptoms.  She had no acute ischemic changes on her EKG.  Her pain was finally relieved with Dilaudid.  She says that this discomfort is somewhat atypical for her Percocet angina.  PAST MEDICAL HISTORY:  1. Coronary artery disease (last catheterization June 2001 - left main free     of critical disease, LAD 30%  followed by 75-80% beyond the second     diagonal, the first diagonal had 50% narrowing, the circumflex at previous     stent site had 70% narrowing, the right coronary artery had a 50% lesion     prior to the origin of the PDA, the PDA had distal 70% stenosis, first     posterolateral had 50% stenosis at a previous PTCA site, distal 80%     stenosis); preserved left ventricular function.  This was felt not to     represent significant change from previous angiograms and she was managed     medically.  2. COPD/emphysema.  3. Diabetes mellitus.  4. Hypertension.  PAST SURGICAL HISTORY:  1. TAH/bilateral tubal ligation.  2. Cholecystectomy.  3. Appendectomy.  ALLERGIES:  1. CODEINE.  2. MORPHINE.  3. NORVASC.  MEDICATIONS:  1. Humulin 15 units q.a.m.  2. Prevacid 30 mg q.d.  3. Imdur 120 mg q.d.  4. Xanax 0.5 mg b.i.d. p.r.n.  5. Celebrex 200 mg p.r.n.  6. Lasix 40 mg q.d.  7. Aspirin 81 mg q.d.  8. Vitamin B6.  9. Klor-Con. 10. Altace 5 mg b.i.d.  SOCIAL HISTORY: The [place] She does not smoke.  FAMILY HISTORY: Contributory for her mother having early coronary artery disease.  REVIEW OF SYSTEMS: Positive for bilateral leg pain, particularly on the right, which she says gets worse every time she has catheterization; light headedness; mild increasing shortness of  breath recently.  Otherwise negative for all other systems.  PHYSICAL EXAMINATION:  GENERAL: The patient is in no distress.  VITAL SIGNS: Blood pressure 156/100, heart rate 74 and regular.  Afebrile. Saturation 97% on room air.  HEENT: Eyelids unremarkable.  PERRL.  Poor dentition.  NECK: No jugular venous distention, wave form within normal limits; carotid upstrokes brisk and symmetric; no bruits or thyromegaly.  LYMPHATICS: No cervical, axillary, or inguinal adenopathy.  LUNGS: Clear to auscultation bilaterally.  BACK: No costovertebral angle  tenderness.  CHEST: Unremarkable.  HEART: PMI not displaced or sustained.  S1 and S2 within normal limits.  No S3, no S4, no murmurs.  ABDOMEN: Flat, positive bowel sounds, normal in frequency and pitch; no bruits, no rebound, no guarding, no midline pulsatile mass, no hepatosplenomegaly or splenomegaly.  SKIN: No rashes, nodules.  EXTREMITIES:  Pulses 2+ throughout.  Right femoral bruit.  No edema.  No cyanosis, no clubbing.  NEUROLOGIC: Oriented to person, place, and time.  Cranial nerves II-XII grossly intact.  Motor grossly intact throughout.  LABORATORY DATA: EKG - sinus rhythm, rate 64, axis within normal limits; intervals within normal limits; poor quality EKG with baseline artifact; poor anterior R wave progression; cannot exclude old inferior infarct, possible left atrial enlargement.  ASSESSMENT/PLAN:  1. The patients chest discomfort is predominantly atypical.  It was not     responsive to nitrates.  She will be ruled out for myocardial infarction.     If her enzymes are negative I would suggest a Cardiolite, which we will     arrange either in the hospital or in our office, whichever is quickest.     Further evaluation can be based on these results.  Of course, if her     enzymes come back positive she would need repeat catheterization.  2. Hypertension.  The patients blood pressure is elevated upon admission.     However, she says that it has been well controlled at home and on other     office visits.  Her medications will be titrated based on subsequent     readings.  3. Diabetes.  The patient will be continued on her insulin and sliding-scale     as needed.  4. Risk reduction.  We will obtain a lipid profile.  Of note, the patient has     not had elevated lipids in the past but should be considered for a statin     based on the results of the Heart Protection Study (HPS).   5. Palpitations.  The patient is describing these.  Some of these appear to     be  related to exertion.  However, she did have some at rest yesterday     which may have prompted her symptoms.  She can follow with Dr. Daleen Squibb and I     would suggest a low threshold for an event monitor if she has recurrent     symptoms. Dictated by:   Rollene Rotunda, M.D. [facility] Attending Physician:  Hilario Quarry DD:  04/25/01 TD:  04/26/01 Job: [id number]

## 2010-06-15 NOTE — Discharge Summary (Signed)
[place]. [facility]  Patient:    Sheila Myers, Sheila Myers Visit Number: [id number] MRN: [id number]          Service Type: MED Location: [phone] Attending Physician:  Rollene Rotunda Dictated by:   Pennelope Bracken, N.P. Admit Date:  04/25/2001 Discharge Date: 04/27/2001                             Discharge Summary  DATE OF BIRTH:  07/22/1928  PRIMARY CARE PHYSICIAN:  Kari Baars, M.D.  REASON FOR ADMISSION:  Palpitations and chest tightness.  DISCHARGE DIAGNOSES: 1. Coronary artery disease, status post prior cardiac catheterization,    June 2001, revealing left anterior descending stenosis of 30% followed by    75-80% beyond the second diagonal, first diagonal 50% narrowing, circumflex    at a previous stent site 70% narrowing, right coronary artery 50% narrowing    prior to the origin of the posterior descending artery and the posterior    descending artery distal 70% stenosis with preserved left ventricular    function. 2. Chronic obstructive pulmonary disease, emphysema. 3. Diabetes mellitus. 4. Hypertension. 5. Status post total abdominal hysterectomy. 6. Status post cholecystectomy. 7. Status post appendectomy.  HISTORY OF PRESENT ILLNESS:  This delightful 77 year old African-American female with coronary disease as described above was in her usual state of health since last catheterization until one week prior to admission.  She became aware of unpleasant palpitations with activity such as walking to the mailbox.  This was accompanied with increasing shortness of breath which was attributed to asthma.  She then had a spell of lightheadedness with exertion while cleaning her yard and had some subsequent chills and difficulty with her gait.  On the day of admission, she awoke at 4 a.m. with palpitations and chest discomfort.  She described this as pressure rated 7/10 with "electric shock" radiation to her right shoulder.  Her pain  was not relieved with nitroglycerin.  She presented to the emergency room at [facility], where she was given IV nitroglycerin, again without improvement.  She was then transferred to [facility] for further evaluation and treatment.  HOSPITAL COURSE:  The patient was admitted, and serial enzymes were drawn. She was maintained on her home medication and monitored on telemetry.  She was placed on heparin per pharmacy protocol.  She was found to be slightly hypokalemic with a potassium of 3.3, and this was treated with replacement. She continued to have episodic feelings of palpitation without any associated change in heart rate or rhythm with the symptoms.  She was placed on sliding-scale insulin to cover her blood sugars.  The patients symptoms improved on day two of admission.  She remained in normal sinus rhythm, so IV nitroglycerin and heparin were discontinued, and she was scheduled for an adenosine Cardiolite.  This was both clinically and EKG negative with a normal hemodynamic response and no ST segment changes to suggest inducible ischemia.  There was no pause, and there was rare, lone ventricular ectopy.  Gated images revealed an ejection fraction of 64% and no evidence of ischemia.  At this point, it was felt prudent to discharge the patient and continue to follow her with an outpatient event monitor, and this was arranged.  PHYSICAL EXAMINATION DAY OF DISCHARGE:  The patient offered no complaints of chest pain, palpitations, or dyspnea.  VITAL SIGNS:  Blood pressure 120/75, pulse 70-80, pulse oxygen 92% on room air.  GENERAL:  The patient was in no acute distress.  CARDIOVASCULAR:  Regular rate and rhythm without murmur, rub or gallop.  LUNGS:  Clear to auscultation bilaterally.  EXTREMITIES:  Without clubbing, cyanosis, or edema.  LABORATORY DATA:  Cardiac enzymes were negative x 3.  CBC revealed a WBC of 14.6, hemoglobin 13.6, hematocrit 42.3, platelets 231.   Chemistries as follows:  Sodium 140, potassium 3.7, chloride 102, CO2 30, glucose 102, BUN 13, creatinine 0.8.  A TSH and a lipid profile were cancelled, and these will be followed on an outpatient basis.  12-lead EKG showed a normal sinus rhythm with a rate of 64 and evidence of left atrial enlargement and left ventricular hypertrophy.  DISPOSITION:  The patient was discharged to home in the care of her family.  DISCHARGE MEDICATIONS: 1. Humulin N 13 units subcu q.a.m. 2. Prevacid 30 mg 1 q.d. 3. Imdur 120 mg 1 q.d. 4. Celebrex 200 mg as before. 5. Lasix 40 mg 1 q.d. 6. Aspirin 81 mg 1 q.d. 7. Vitamin B6 100 mg 1 q.d. 8. Klor-Con 10 mEq 1 q.d. 9. Altace 5 mg 1 b.i.d.  ACTIVITY SUGGESTED:  As tolerated.  Exercise for 30 minutes a day is recommended.  DIET:  Low fat, low salt, low cholesterol diet.  FOLLOW-UP:  The patient will follow up with the P.A at [facility] and will be called by [facility] about both this and obtaining an event monitor in the next day or so.  She knows to call us in the interim with any problems, questions, concerns, or change or increase in symptoms. Dictated by:   Pennelope Bracken, N.P. Attending Physician:  Rollene Rotunda DD:  04/27/01 TD:  04/27/01 Job: [id number]

## 2010-06-15 NOTE — Consult Note (Signed)
Sheila Myers, Sheila Myers             ACCOUNT NO.:  [id number]   MEDICAL RECORD NO.:  [id number]           PATIENT TYPE:  AMB   LOCATION:  DAY                           FACILITY:  [facility]   PHYSICIAN:  Kassie Mends, M.D.      DATE OF BIRTH:  [birth year]   DATE OF CONSULTATION:  10/07/2005  DATE OF DISCHARGE:                                   CONSULTATION   Dear Dr. [name]:   I am seeing [name] as a new patient consultation per your request.  I  am seeing her for reflux and hemorrhoids.   [name] is a 77 year old female who reports being diagnosed with  gastroesophageal reflux disease approximately 3-4 years ago.  She said  symptoms were controlled on Prilosec once daily.  She reports the need to be  changed from Prilosec to Zegerid approximately 1 month ago when her symptoms  became uncontrolled.  She describes her gastroesophageal reflux symptoms as  difficulty with pain in her epigastrium.  She describes it as a burning  sensation that does not radiate into her chest but moves down into her  stomach.  It is not a pain.  It is a feeling of hotness and tenderness in  her abdomen.  It is associated with nausea.  It is not associated with  difficulty swallowing, vomiting, weight loss, blood in her stool, or black  tarry stools.  She reports always having a poor appetite.  Her triggers for  this sensation are spicy foods, greasy foods, and dairy products.  She also  gets diarrhea with greasy foods and milk products.  Her symptoms were still  not ideally controlled with initiation of Zegerid so her Zegerid was  increased to twice daily approximately 7 days ago.  Her symptoms are  somewhat better.  She also complains of hemorrhoids off and on for years.  She had 11 children. She used to have flares with walking on concrete and  lifting heavy things.  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 She  usually would take sitz baths and use some Proctozone and her symptoms would  get  better after a couple of weeks. She has been trying these methods but  her symptoms have been unresolved for the last 2 weeks.  She denies any  rectal bleeding.  She does have a burning sensation in her rectum. She  denies rectal itching but has some itching in her vaginal area.  She has  never had a colonoscopy.  She denies constipation.   PAST MEDICAL HISTORY:  1. Diverticulitis.  2. Chronic back pain.  3. Anxiety.  4. Hyperlipidemia.  5. Hypertension.  6. Emphysema.   ALLERGIES:  SHE IS ALLERGIC TO CODEINE AND MORPHINE.   CURRENT MEDICATION LIST:  1. Proctozone-HC 2.5% q.i.d.  2. Tucks p.r.n.  3. Potassium 20 mEq daily.  4. Lasix 20 mg daily.  5. Xanax 0.5 mg 1 daily as needed.  6. Imdur 60 mg daily.  7. Darvocet 4 times daily as needed.  8. Lotrel 5/20 mg daily.  9. Zegerid 40 mg twice daily.  10.Tylenol  as needed.   FAMILY HISTORY:  She has no family history of colon cancer or polyps.  Her  sister had breast cancer.  She has no family history of uterine or ovarian  cancer.   She is widowed and has 11 children.  She denies any tobacco or alcohol use.   REVIEW OF SYSTEMS:  Per the HPI.  Otherwise, all systems are negative.   PHYSICAL EXAMINATION:  VITAL SIGNS:  Weight 148.5 pounds.  Height 5 feet 6  inches.  BMI 24 (normal). Temperature 98.  Blood pressure 162/80.  Pulse is  76.  GENERAL:  In general, she is in no apparent distress alert and oriented x4.  HEENT:  HEENT exam is atraumatic and normocephalic.  Pupils are equal and  react to light.  Mouth:  No oral lesions.  Posterior pharynx without  erythema or exudate.  NECK:  Her neck has full range of motion.  No lymphadenopathy.  LUNGS:  Her lungs are clear to auscultation bilaterally.  CARDIOVASCULAR:  Regular rhythm.  No murmur.  Normal S1 and S2.  ABDOMEN:  Bowel sounds are present.  Soft, nontender, nondistended.  No  rebound or guarding.  No hepatosplenomegaly.  Midline incision well healed.  EXTREMITIES:   Her extremities are without cyanosis, clubbing, or edema.  She  has no focal neurologic deficits.   [name] is a 77 year old female with new-onset epigastric abdominal  pain.  Differential diagnosis includes uncontrolled reflux, Helicobacter  pylori gastritis, and a low likelihood of gastric malignancy or peptic ulcer  disease.  She needs average-risk colon cancer screening.   Thank you for allowing me to see [name] in consultation.  My  recommendations follow.   I recommend EGD to evaluate new-onset epigastric pain in a 77 year old  female.  She will have a colonoscopy at that time for average-risk colon  cancer screening.  She has requested the OsmoPrep.  She is asked to use  Colace 100 mg 2-3 times daily to soften her stool.  She is asked to get  Citrucel and use it once daily.  She may continue the Proctozone.  She will  follow up in 1 month.  If her symptoms are not significantly improved, she  will need to see a local surgeon for surgical intervention.  She may use  witch hazel pads for additional hemorrhoidal relief. She may use Tylenol for  additional pain relief.  She is asked to obtain a hemorrhoid donut from the  medical supply store to use for the next 2 weeks while sitting.  She is  given a 2-week supply of samples of Nexium twice daily.  She is also given a  prescription for Nexium twice daily.  I will give her further instructions  on what to do with her PPI once the upper endoscopy is complete.   Please feel free to contact me at [phone] with additional questions.   Sincerely,      Kassie Mends, M.D.  Electronically Signed     SM/MEDQ  D:  10/07/2005  T:  10/08/2005  Job:  [id number]   cc:   Ramon Dredge L. [name], M.D.  Fax: [phone]

## 2010-06-15 NOTE — Procedures (Signed)
Sheila Myers, Sheila Myers             ACCOUNT NO.:  [id number]   MEDICAL RECORD NO.:  [id number]          PATIENT TYPE:  INP   LOCATION:  A331                          FACILITY:  [facility]   PHYSICIAN:  [place] Bing, M.D.  DATE OF BIRTH:  06-26-1928   DATE OF PROCEDURE:  DATE OF DISCHARGE:                                EKG INTERPRETATION   FINDINGS:  1.  Normal sinus rhythm.  2.  Left atrial enlargement.  3.  Q/T prolongation.  4.  Nonspecific inferior T wave abnormalities.  5.  Nondiagnostic inferior Q waves.     [place]   RR/MEDQ  D:  11/15/2003  T:  11/15/2003  Job:  [id number]

## 2010-06-15 NOTE — Discharge Summary (Signed)
Sheila Myers, Sheila Myers             ACCOUNT NO.:  [id number]   MEDICAL RECORD NO.:  [id number]          PATIENT TYPE:  INP   LOCATION:  A331                          FACILITY:  [facility]   PHYSICIAN:  Margaretmary Dys, M.D.DATE OF BIRTH:  09-Sep-1928   DATE OF ADMISSION:  11/13/2003  DATE OF DISCHARGE:  10/20/2005LH                                 DISCHARGE SUMMARY   DISCHARGE DIAGNOSES:  1.  Acute diverticulitis.  2.  Acute abdominal pain.   OTHER DIAGNOSES:  1.  Coronary artery disease with stents to circumflex and posterior lateral      arteries in March 2001.  2.  Diabetes mellitus.  3.  Hyperlipidemia.  4.  Asthma/bronchitis.  5.  Hypertension.  6.  Ruptured disk.   DISCHARGE MEDICATIONS:  1.  Ciprofloxacin 500 mg p.o. b.i.d. x7 days.  2.  Flagyl 500 mg t.i.d. x7 days.  3.  Lipitor 10 mg p.o. daily.  4.  Potassium chloride 30 mEq p.o. daily.  5.  Nitroglycerin 0.4 mg p.r.n. as needed sublingually.  6.  Prevacid 30 mg p.o. daily.  7.  Xanax 0.5 mg p.o. t.i.d. p.r.n.  8.  Hydrocodone/acetaminophen p.r.n.  9.  Torsemide 20 mg p.o. daily.   SPECIAL INSTRUCTIONS:  The patient has been asked to return to the emergency  room with severe abdominal pain recurrence with nausea and vomiting, or if  the patient has a fever.   FOLLOWUP:  Dr. [name] as needed.   DIET:  The patient was advised to avoid foods with seeds and to make sure  she tries to avoid constipation.   ACTIVITY:  The patient will increase activity slowly.   HOSPITAL COURSE:  The patient was admitted on November 13, 2003, with nausea,  vomiting, and abdominal pain.  The patient had a CT scan which confirmed the  presence of acute diverticulitis involving her sigmoid and descending colon.  She was subsequently placed on ciprofloxacin 400mg  IV q.12h., Flagyl 500 mg  IV q.8h.  During her course of hospitalization, the patient did very well.  She remained hemodynamically stable, did not require any interventions  surgically.  Her abdominal pain also resolved.   Pain was also controlled with Dilaudid.   Her white blood cell count also improved.  I saw the patient on October 18  and November 16, 2003, and also on the day of discharge, November 17, 2003.  The patient was doing much better, was hemodynamically stable, was no longer  febrile, had no leukocytosis, basic metabolic panel was normal.  She was  subsequently discharged in satisfactory condition.   DISPOSITION:  The patient is discharged to home.   LABORATORY DATA:  On admission, the patient had a white blood cell count of  10.9, hematocrit 44.9, MCV 60.9, platelets 301.  Neutrophils 72, lymphocytes  21, monocytes 6%.  Sodium 136, potassium 3.5, chloride 100, bicarbonate 24,  glucose 152, BUN 22, creatinine 1.5, bilirubin 1.5, alkaline phosphatase 83,  SGOT 35, SGPT 40, total protein 6.8, albumin 2.7, lipase 29, calcium 9.  CT  scan of the abdomen and pelvis showed mild diverticulitis in the sigmoid and  descending colon with some thickening.     Ayor   AM/MEDQ  D:  01/07/2004  T:  01/07/2004  Job:  [id number]

## 2010-09-05 ENCOUNTER — Encounter: Payer: Self-pay | Admitting: Gastroenterology

## 2010-09-06 ENCOUNTER — Telehealth: Payer: Self-pay | Admitting: Gastroenterology

## 2010-09-06 ENCOUNTER — Ambulatory Visit: Payer: 59 | Admitting: Gastroenterology

## 2010-09-06 NOTE — Telephone Encounter (Signed)
NOTED

## 2010-09-30 ENCOUNTER — Encounter (HOSPITAL_COMMUNITY): Payer: Self-pay | Admitting: *Deleted

## 2010-09-30 ENCOUNTER — Emergency Department (HOSPITAL_COMMUNITY)
Admission: EM | Admit: 2010-09-30 | Discharge: 2010-09-30 | Disposition: A | Discharge status: Home / Self Care | Payer: PRIVATE HEALTH INSURANCE | Attending: Emergency Medicine | Admitting: Emergency Medicine

## 2010-09-30 ENCOUNTER — Emergency Department (HOSPITAL_COMMUNITY): Payer: PRIVATE HEALTH INSURANCE

## 2010-09-30 ENCOUNTER — Other Ambulatory Visit: Payer: Self-pay | Admitting: Gastroenterology

## 2010-09-30 DIAGNOSIS — E785 Hyperlipidemia, unspecified: Secondary | ICD-10-CM | POA: Insufficient documentation

## 2010-09-30 DIAGNOSIS — Z888 Allergy status to other drugs, medicaments and biological substances status: Secondary | ICD-10-CM | POA: Insufficient documentation

## 2010-09-30 DIAGNOSIS — H409 Unspecified glaucoma: Secondary | ICD-10-CM | POA: Insufficient documentation

## 2010-09-30 DIAGNOSIS — M549 Dorsalgia, unspecified: Secondary | ICD-10-CM | POA: Insufficient documentation

## 2010-09-30 DIAGNOSIS — Z885 Allergy status to narcotic agent status: Secondary | ICD-10-CM | POA: Insufficient documentation

## 2010-09-30 DIAGNOSIS — K219 Gastro-esophageal reflux disease without esophagitis: Secondary | ICD-10-CM | POA: Insufficient documentation

## 2010-09-30 DIAGNOSIS — K589 Irritable bowel syndrome without diarrhea: Secondary | ICD-10-CM | POA: Insufficient documentation

## 2010-09-30 MED ORDER — HYDROMORPHONE HCL 2 MG/ML IJ SOLN
2.0000 mg | Freq: Once | INTRAMUSCULAR | Status: AC
  Administered 2010-09-30: 2 mg via INTRAMUSCULAR
  Filled 2010-09-30: qty 1

## 2010-09-30 MED ORDER — HYDROMORPHONE HCL 2 MG PO TABS: 2.0000 mg | ORAL_TABLET | ORAL | Status: AC | PRN

## 2010-09-30 NOTE — ED Provider Notes (Signed)
Scribed for Sheila Jakes, MD, the patient was seen in room [facility]. This chart was scribed by Sheila Myers. The patient's care started at 12:14  CSN: [id number] Arrival date & time: 09/30/2010 11:14 AM  Chief Complaint  Patient presents with  . Back Pain   HPI Sheila Myers is a 77 y.o. female who presents to the Emergency Department complaining of right lower back pain radiating down into the right leg, onset Friday 09/28/2010. Patient denies fall. States that pain is aggravated by movement. Patient rates pain at 10/10 on NPS NPS. There are no other associated symptoms and no other alleviating or aggravating factors.  HPI ELEMENTS:  Location: Back  Onset: 09/28/2010 Duration: 3 days  Severity: 10/10  Modifying factors: aggravated by movement Context:  as above  Associated symptoms:     Past Medical History  Diagnosis Date  . Irritable bowel syndrome   . GERD (gastroesophageal reflux disease)   . Diverticulosis   . Hemorrhoids   . Hyperlipidemia   . Glaucoma   . Right shoulder pain    MEDICATIONS:  Previous Medications   ALPRAZOLAM (XANAX) 1 MG TABLET    Take 1 mg by mouth 2 (two) times daily as needed.     AMLODIPINE-BENAZEPRIL (LOTREL) 5-20 MG PER CAPSULE    Take 1 capsule by mouth daily.     BIMATOPROST (LUMIGAN) 0.03 % OPHTHALMIC DROPS    Place 1 drop into both eyes at bedtime.     ESOMEPRAZOLE (NEXIUM) 40 MG PACKET    Take 30 minutes prior to supper   HYDROCODONE-ACETAMINOPHEN (NORCO) 5-325 MG PER TABLET    Take 1 tablet by mouth every 6 (six) hours as needed.     PRAVASTATIN (PRAVACHOL) 40 MG TABLET    Take 40 mg by mouth daily.     PREDNISONE (DELTASONE) 10 MG TABLET    Take 10 mg by mouth daily.       ALLERGIES:  Allergies as of 09/30/2010 - Review Complete 09/30/2010  Allergen Reaction Noted  . Codeine  05/10/2008  . Hydrocodone  03/31/2008  . Morphine  05/10/2008  . Peanut-containing drug products  03/31/2008      Past Surgical History    Procedure Date  . Cardiac stents   . Abdominal hysterectomy   . Appendectomy     History reviewed. No pertinent family history.  History  Substance Use Topics  . Smoking status: Never Smoker   . Smokeless tobacco: Never Used  . Alcohol Use: No      Review of Systems  Cardiovascular: Negative for chest pain.  Gastrointestinal: Negative for nausea, vomiting, abdominal pain and diarrhea.  Neurological: Negative for numbness (no numbness in right foot by reports numbness in the mid right shin).  All other systems reviewed and are negative.    Physical Exam  BP 145/88  Pulse 102  Temp(Src) 97.4 F (36.3 C) (Oral)  Resp 18  Ht 5\' 3"  (1.6 m)  Wt 149 lb (67.586 kg)  BMI 26.39 kg/m2  SpO2 100%  Physical Exam  Nursing note and vitals reviewed. Constitutional: She is oriented to person, place, and time. She appears well-developed and well-nourished. No distress.  HENT:  Head: Normocephalic and atraumatic.  Mouth/Throat: Oropharynx is clear and moist. No oropharyngeal exudate.  Eyes: Conjunctivae and EOM are normal. Pupils are equal, round, and reactive to light.  Neck: Normal range of motion. Neck supple. No tracheal deviation present.  Cardiovascular: Normal rate, regular rhythm and normal heart sounds.   Pulmonary/Chest:  Effort normal and breath sounds normal. No respiratory distress. She has no wheezes. She has no rales.  Abdominal: Soft. Bowel sounds are normal. She exhibits no distension. There is no tenderness. There is no rebound and no guarding.  Musculoskeletal: Normal range of motion. She exhibits no edema and no tenderness.  Neurological: She is alert and oriented to person, place, and time. No cranial nerve deficit.  Skin: Skin is warm and dry. No rash noted. No erythema.  Psychiatric: She has a normal mood and affect. Her behavior is normal.    ED Course  Procedures  OTHER DATA REVIEWED: Nursing notes, vital signs, and past medical records reviewed.     DIAGNOSTIC STUDIES: Oxygen Saturation is 100% on room air, normal by my interpretation.    LABS / RADIOLOGY:  Ct Lumbar Spine Wo Contrast  09/30/2010  *RADIOLOGY REPORT*  Clinical Data: Back pain.  CT LUMBAR SPINE WITHOUT CONTRAST  Technique:  Multidetector CT imaging of the lumbar spine was performed without intravenous contrast administration. Multiplanar CT image reconstructions were also generated.  Comparison: MRI abdomen 06/09/2006.  Findings: The sagittal reformatted images demonstrate normal alignment of the lumbar vertebral bodies.  There is advanced osteoporosis but no acute compression fracture.  Advanced facet degenerative changes are noted without pars defect.  There are aortic calcifications without definite aneurysm.  Renal cysts are noted.  L1-2:  No significant findings.  L2-3:  Advanced degenerative disease with a bulging degenerated annulus with rim calcification.  This in conjunction with short pedicles and facet disease creates moderate spinal and bilateral lateral recess stenosis.  No significant foraminal stenosis.  L3-4:  Diffuse extra foraminal and far lateral disc protrusion on the right is somewhat displacing the right L3 nerve root.  This also significant facet disease and ligamentum flavum thickening contributing to moderate spinal and bilateral lateral recess stenosis.  L4-5:  Diffuse bulging uncovered disc, osteophytic ridging and facet disease contributing to moderately severe spinal and bilateral lateral recess stenosis is also mild right foraminal stenosis.  L5-S1:  Severe disc disease.  There is a bulging uncovered disc with mild spinal and bilateral foraminal stenosis.  IMPRESSION:  1.  Normal alignment and no acute bony findings. 2.  Osteoporosis. 3.  Multilevel spinal and foraminal stenosis as discussed above.  Original Report Authenticated By: P. Loralie Champagne, M.D.     ED COURSE / COORDINATION OF CARE: 12:30 - EDMD examined patient and ordered a CT Lumber Spine  Wo Contrast    MDM:   SUSPECT FEMORAL NERVE RADICULOPATHY, CT WITH OUR FRACTURE. WILL ADD DILUADID TO HER CURRENT PAIN MEDS.   IMPRESSION: Diagnoses that have been ruled out:  Diagnoses that are still under consideration:  Final diagnoses:    PLAN:  Home  The patient is to return the emergency department if there is any worsening of symptoms. I have reviewed the discharge instructions with the patient  CONDITION ON DISCHARGE: Stable  MEDICATIONS GIVEN IN THE E.D.  Medications  HYDROmorphone (DILAUDID) injection 2 mg (2 mg Intramuscular Given 09/30/10 1342)    DISCHARGE MEDICATIONS: New Prescriptions   No medications on file    SCRIBE ATTESTATION:   I personally performed the services described in this documentation, which was scribed in my presence. The recorded information has been reviewed and considered. Sheila Jakes, MD    Sheila Jakes, MD 09/30/10 [phone]

## 2010-09-30 NOTE — ED Notes (Signed)
Pt a/ox4. Resp even and unlabored. NAD at this time. D/C instructions reviewed with pt. Pt verbalized understanding. Pt to lobby via w/c.  

## 2010-09-30 NOTE — ED Notes (Signed)
Pt c/o pain in the right side of her back radiating to buttock and leg. States that she has pain in her right shoulder at times. Denies injury.

## 2010-09-30 NOTE — ED Notes (Signed)
[name]

## 2010-10-02 NOTE — Progress Notes (Signed)
Pharmacy called- pt has been on nexium bid. rx refill was sent for qd. They wanted to clarify change in directions. Spoke with KJ, ok to keep pt on bid dosing. Pharmacy aware.

## 2010-10-18 LAB — URINALYSIS, ROUTINE W REFLEX MICROSCOPIC
Glucose, UA: NEGATIVE
Protein, ur: 30 — AB
Specific Gravity, Urine: 1.025

## 2010-10-18 LAB — URINE MICROSCOPIC-ADD ON

## 2010-10-25 LAB — DIFFERENTIAL
Basophils Relative: 0
Eosinophils Relative: 0
Lymphocytes Relative: 10 — ABNORMAL LOW
Lymphs Abs: 1.4
Monocytes Absolute: 0.7
Monocytes Absolute: 1.1 — ABNORMAL HIGH
Monocytes Relative: 5
Monocytes Relative: 7
Neutro Abs: 10 — ABNORMAL HIGH

## 2010-10-25 LAB — BASIC METABOLIC PANEL
BUN: 11
GFR calc non Af Amer: >60
Glucose, Bld: 128 — ABNORMAL HIGH
Potassium: 3.3 — ABNORMAL LOW

## 2010-10-25 LAB — CBC
HCT: 38.7
Hemoglobin: 12.4
MCHC: 32
MCHC: 32.1
MCV: 70.8 — ABNORMAL LOW
MCV: 70.9 — ABNORMAL LOW
Platelets: 346
RBC: 5.46 — ABNORMAL HIGH
WBC: 15.1 — ABNORMAL HIGH

## 2010-10-25 LAB — COMPREHENSIVE METABOLIC PANEL
AST: 14
Albumin: 3.3 — ABNORMAL LOW
Calcium: 9.6
Creatinine, Ser: 0.84
GFR calc Af Amer: >60
Sodium: 139

## 2010-10-25 LAB — BLOOD GAS, ARTERIAL
Acid-Base Excess: 3.4 — ABNORMAL HIGH
Bicarbonate: 26.9 — ABNORMAL HIGH
TCO2: 23.4
pCO2 arterial: 37.4
pO2, Arterial: 87.1

## 2010-10-27 ENCOUNTER — Other Ambulatory Visit: Payer: Self-pay | Admitting: Gastroenterology

## 2011-01-09 ENCOUNTER — Ambulatory Visit (HOSPITAL_COMMUNITY)
Admission: RE | Admit: 2011-01-09 | Discharge: 2011-01-09 | Disposition: A | Discharge status: Home / Self Care | Payer: PRIVATE HEALTH INSURANCE | Source: Ambulatory Visit | Attending: Pulmonary Disease | Admitting: Pulmonary Disease

## 2011-01-09 ENCOUNTER — Other Ambulatory Visit (HOSPITAL_COMMUNITY): Payer: Self-pay | Admitting: Pulmonary Disease

## 2011-01-09 DIAGNOSIS — R609 Edema, unspecified: Secondary | ICD-10-CM

## 2011-01-09 DIAGNOSIS — M7989 Other specified soft tissue disorders: Secondary | ICD-10-CM | POA: Insufficient documentation

## 2011-02-26 ENCOUNTER — Other Ambulatory Visit: Payer: Self-pay

## 2011-02-27 MED ORDER — ESOMEPRAZOLE MAGNESIUM 40 MG PO CPDR: 40.0000 mg | DELAYED_RELEASE_CAPSULE | Freq: Two times a day (BID) | ORAL | Status: DC

## 2011-06-07 ENCOUNTER — Ambulatory Visit (HOSPITAL_COMMUNITY)
Admission: RE | Admit: 2011-06-07 | Discharge: 2011-06-07 | Disposition: A | Discharge status: Home / Self Care | Payer: PRIVATE HEALTH INSURANCE | Source: Ambulatory Visit | Attending: Pulmonary Disease | Admitting: Pulmonary Disease

## 2011-06-07 DIAGNOSIS — I10 Essential (primary) hypertension: Secondary | ICD-10-CM | POA: Insufficient documentation

## 2011-06-07 DIAGNOSIS — I2510 Atherosclerotic heart disease of native coronary artery without angina pectoris: Secondary | ICD-10-CM | POA: Insufficient documentation

## 2011-06-07 DIAGNOSIS — I517 Cardiomegaly: Secondary | ICD-10-CM

## 2011-06-07 NOTE — Progress Notes (Signed)
*  PRELIMINARY RESULTS* Echocardiogram 2D Echocardiogram has been performed.  Sheila Myers 06/07/2011, 9:28 AM

## 2011-06-26 ENCOUNTER — Emergency Department (HOSPITAL_COMMUNITY): Payer: PRIVATE HEALTH INSURANCE

## 2011-06-26 ENCOUNTER — Emergency Department (HOSPITAL_COMMUNITY)
Admission: EM | Admit: 2011-06-26 | Discharge: 2011-06-26 | Disposition: A | Discharge status: Home / Self Care | Payer: PRIVATE HEALTH INSURANCE | Attending: Emergency Medicine | Admitting: Emergency Medicine

## 2011-06-26 ENCOUNTER — Encounter (HOSPITAL_COMMUNITY): Payer: Self-pay | Admitting: *Deleted

## 2011-06-26 DIAGNOSIS — H409 Unspecified glaucoma: Secondary | ICD-10-CM | POA: Insufficient documentation

## 2011-06-26 DIAGNOSIS — M25559 Pain in unspecified hip: Secondary | ICD-10-CM | POA: Insufficient documentation

## 2011-06-26 DIAGNOSIS — R600 Localized edema: Secondary | ICD-10-CM

## 2011-06-26 DIAGNOSIS — M549 Dorsalgia, unspecified: Secondary | ICD-10-CM

## 2011-06-26 DIAGNOSIS — K589 Irritable bowel syndrome without diarrhea: Secondary | ICD-10-CM | POA: Insufficient documentation

## 2011-06-26 DIAGNOSIS — M542 Cervicalgia: Secondary | ICD-10-CM | POA: Insufficient documentation

## 2011-06-26 DIAGNOSIS — E785 Hyperlipidemia, unspecified: Secondary | ICD-10-CM | POA: Insufficient documentation

## 2011-06-26 DIAGNOSIS — M79609 Pain in unspecified limb: Secondary | ICD-10-CM | POA: Insufficient documentation

## 2011-06-26 DIAGNOSIS — K219 Gastro-esophageal reflux disease without esophagitis: Secondary | ICD-10-CM | POA: Insufficient documentation

## 2011-06-26 DIAGNOSIS — M25519 Pain in unspecified shoulder: Secondary | ICD-10-CM | POA: Insufficient documentation

## 2011-06-26 DIAGNOSIS — Z79899 Other long term (current) drug therapy: Secondary | ICD-10-CM | POA: Insufficient documentation

## 2011-06-26 DIAGNOSIS — R609 Edema, unspecified: Secondary | ICD-10-CM | POA: Insufficient documentation

## 2011-06-26 DIAGNOSIS — M25569 Pain in unspecified knee: Secondary | ICD-10-CM | POA: Insufficient documentation

## 2011-06-26 DIAGNOSIS — M7989 Other specified soft tissue disorders: Secondary | ICD-10-CM | POA: Insufficient documentation

## 2011-06-26 MED ORDER — OXYCODONE-ACETAMINOPHEN 5-325 MG PO TABS: 1.0000 | ORAL_TABLET | Freq: Four times a day (QID) | ORAL | Status: AC | PRN

## 2011-06-26 MED ORDER — HYDROMORPHONE HCL PF 1 MG/ML IJ SOLN
0.5000 mg | Freq: Once | INTRAMUSCULAR | Status: AC
  Administered 2011-06-26: 0.5 mg via INTRAMUSCULAR
  Filled 2011-06-26: qty 1

## 2011-06-26 NOTE — ED Notes (Signed)
C/o swelling to bilateral feet and legs, c/o pinched nerve

## 2011-06-26 NOTE — ED Notes (Signed)
EMD currently at bedside. Pt states swelling to left lower leg at night and pain to bilateral legs which is ongoing. Pt states she recently had negative  Ultrasound to leg.

## 2011-06-26 NOTE — Discharge Instructions (Signed)
Back Pain, Adult Low back pain is very common. About 1 in 5 people have back pain.The cause of low back pain is rarely dangerous. The pain often gets better over time.About half of people with a sudden onset of back pain feel better in just 2 weeks. About 8 in 10 people feel better by 6 weeks.  CAUSES Some common causes of back pain include:  Strain of the muscles or ligaments supporting the spine.   Wear and tear (degeneration) of the spinal discs.   Arthritis.   Direct injury to the back.  DIAGNOSIS Most of the time, the direct cause of low back pain is not known.However, back pain can be treated effectively even when the exact cause of the pain is unknown.Answering your caregiver's questions about your overall health and symptoms is one of the most accurate ways to make sure the cause of your pain is not dangerous. If your caregiver needs more information, he or she may order lab work or imaging tests (X-rays or MRIs).However, even if imaging tests show changes in your back, this usually does not require surgery. HOME CARE INSTRUCTIONS For many people, back pain returns.Since low back pain is rarely dangerous, it is often a condition that people can learn to manageon their own.   Remain active. It is stressful on the back to sit or stand in one place. Do not sit, drive, or stand in one place for more than 30 minutes at a time. Take short walks on level surfaces as soon as pain allows.Try to increase the length of time you walk each day.   Do not stay in bed.Resting more than 1 or 2 days can delay your recovery.   Do not avoid exercise or work.Your body is made to move.It is not dangerous to be active, even though your back may hurt.Your back will likely heal faster if you return to being active before your pain is gone.   Pay attention to your body when you bend and lift. Many people have less discomfortwhen lifting if they bend their knees, keep the load close to their  bodies,and avoid twisting. Often, the most comfortable positions are those that put less stress on your recovering back.   Find a comfortable position to sleep. Use a firm mattress and lie on your side with your knees slightly bent. If you lie on your back, put a pillow under your knees.   Only take over-the-counter or prescription medicines as directed by your caregiver. Over-the-counter medicines to reduce pain and inflammation are often the most helpful.Your caregiver may prescribe muscle relaxant drugs.These medicines help dull your pain so you can more quickly return to your normal activities and healthy exercise.   Put ice on the injured area.   Put ice in a plastic bag.   Place a towel between your skin and the bag.   Leave the ice on for 15 to 20 minutes, 3 to 4 times a day for the first 2 to 3 days. After that, ice and heat may be alternated to reduce pain and spasms.   Ask your caregiver about trying back exercises and gentle massage. This may be of some benefit.   Avoid feeling anxious or stressed.Stress increases muscle tension and can worsen back pain.It is important to recognize when you are anxious or stressed and learn ways to manage it.Exercise is a great option.  SEEK MEDICAL CARE IF:  You have pain that is not relieved with rest or medicine.   You have   pain that does not improve in 1 week.   You have new symptoms.   You are generally not feeling well.  SEEK IMMEDIATE MEDICAL CARE IF:   You have pain that radiates from your back into your legs.   You develop new bowel or bladder control problems.   You have unusual weakness or numbness in your arms or legs.   You develop nausea or vomiting.   You develop abdominal pain.   You feel faint.  Document Released: 01/14/2005 Document Revised: 01/03/2011 Document Reviewed: 06/04/2010 [employer] Patient Information 2012 [employer].Edema Edema is an abnormal build-up of fluids in tissues. Because this is  partly dependent on gravity (water flows to the lowest place), it is more common in the leg sand thighs (lower extremities). It is also common in the looser tissues, like around the eyes. Painless swelling of the feet and ankles is common and increases as a person ages. It may affect both legs and may include the calves or even thighs. When squeezed, the fluid may move out of the affected area and may leave a dent for a few moments. CAUSES   Prolonged standing or sitting in one place for extended periods of time. Movement helps pump tissue fluid into the veins, and absence of movement prevents this, resulting in edema.   Varicose veins. The valves in the veins do not work as well as they should. This causes fluid to leak into the tissues.   Fluid and salt overload.   Injury, burn, or surgery to the leg, ankle, or foot, may damage veins and allow fluid to leak out.   Sunburn damages vessels. Leaky vessels allow fluid to go out into the sunburned tissues.   Allergies (from insect bites or stings, medications or chemicals) cause swelling by allowing vessels to become leaky.   Protein in the blood helps keep fluid in your vessels. Low protein, as in malnutrition, allows fluid to leak out.   Hormonal changes, including pregnancy and menstruation, cause fluid retention. This fluid may leak out of vessels and cause edema.   Medications that cause fluid retention. Examples are sex hormones, blood pressure medications, steroid treatment, or anti-depressants.   Some illnesses cause edema, especially heart failure, kidney disease, or liver disease.   Surgery that cuts veins or lymph nodes, such as surgery done for the heart or for breast cancer, may result in edema.  DIAGNOSIS  Your caregiver is usually easily able to determine what is causing your swelling (edema) by simply asking what is wrong (getting a history) and examining you (doing a physical). Sometimes x-rays, EKG (electrocardiogram or heart  tracing), and blood work may be done to evaluate for underlying medical illness. TREATMENT  General treatment includes:  Leg elevation (or elevation of the affected body part).   Restriction of fluid intake.   Prevention of fluid overload.   Compression of the affected body part. Compression with elastic bandages or support stockings squeezes the tissues, preventing fluid from entering and forcing it back into the blood vessels.   Diuretics (also called water pills or fluid pills) pull fluid out of your body in the form of increased urination. These are effective in reducing the swelling, but can have side effects and must be used only under your caregiver's supervision. Diuretics are appropriate only for some types of edema.  The specific treatment can be directed at any underlying causes discovered. Heart, liver, or kidney disease should be treated appropriately. HOME CARE INSTRUCTIONS   Elevate the legs (  or affected body part) above the level of the heart, while lying down.   Avoid sitting or standing still for prolonged periods of time.   Avoid putting anything directly under the knees when lying down, and do not wear constricting clothing or garters on the upper legs.   Exercising the legs causes the fluid to work back into the veins and lymphatic channels. This may help the swelling go down.   The pressure applied by elastic bandages or support stockings can help reduce ankle swelling.   A low-salt diet may help reduce fluid retention and decrease the ankle swelling.   Take any medications exactly as prescribed.  SEEK MEDICAL CARE IF:  Your edema is not responding to recommended treatments. SEEK IMMEDIATE MEDICAL CARE IF:   You develop shortness of breath or chest pain.   You cannot breathe when you lay down; or if, while lying down, you have to get up and go to the window to get your breath.   You are having increasing swelling without relief from treatment.   You  develop a fever over 102 F (38.9 C).   You develop pain or redness in the areas that are swollen.   Tell your caregiver right away if you have gained 3 lb/1.4 kg in 1 day or 5 lb/2.3 kg in a week.  MAKE SURE YOU:   Understand these instructions.   Will watch your condition.   Will get help right away if you are not doing well or get worse.  Document Released: 01/14/2005 Document Revised: 01/03/2011 Document Reviewed: 09/02/2007 [employer] Patient Information 2012 [employer]. 

## 2011-06-26 NOTE — ED Provider Notes (Signed)
History    Scribed for [employer]. Excell Neyland, MD, the patient was seen in room APA10/APA10. This chart was scribed by Katha Cabal.   CSN: [id number]  Arrival date & time 06/26/11  1136   First MD Initiated Contact with Patient 06/26/11 1202      Chief Complaint  Patient presents with  . Leg Swelling    (Consider location/radiation/quality/duration/timing/severity/associated sxs/prior treatment) HPI Sheila Myers. Azlyn Wingler, MD entered patient's room at 12:07 PM   Sheila Myers is a 78 y.o. female who presents to the Emergency Department complaining of moderate left leg swelling and right side pain.  Patient reports pain increases when turning over in bed.  Denies injury or recent fall.  Patient took 5 mg hydrocodone for pain with some short term relief.    Symptoms are not associated with cough, shortness of breath or chest pain.     PCP Fredirick Maudlin, MD, MD    Past Medical History  Diagnosis Date  . Irritable bowel syndrome   . GERD (gastroesophageal reflux disease)   . Diverticulosis   . Hemorrhoids   . Hyperlipidemia   . Glaucoma   . Right shoulder pain     Past Surgical History  Procedure Date  . Cardiac stents   . Abdominal hysterectomy   . Appendectomy   . Cholecystectomy     History reviewed. No pertinent family history.  History  Substance Use Topics  . Smoking status: Never Smoker   . Smokeless tobacco: Never Used  . Alcohol Use: No    OB History    Grav Para Term Preterm Abortions TAB SAB Ect Mult Living                  Review of Systems  Constitutional: Negative.   HENT: Negative.   Eyes: Negative.   Respiratory: Negative.  Negative for cough and shortness of breath.   Cardiovascular: Negative.  Negative for chest pain.  Gastrointestinal: Negative.   Genitourinary: Negative.   Musculoskeletal: Positive for back pain.       Right leg pain, right shoulder pain  Neurological: Negative.   Hematological: Negative.     Psychiatric/Behavioral: Negative.     Allergies  Peanut-containing drug products; Codeine; and Morphine  Home Medications   Current Outpatient Rx  Name Route Sig Dispense Refill  . ALPRAZOLAM 1 MG PO TABS Oral Take 1 mg by mouth 2 (two) times daily as needed. anxiety    . AMLODIPINE BESY-BENAZEPRIL HCL 5-20 MG PO CAPS Oral Take 1 capsule by mouth daily.      Marland Kitchen BIMATOPROST 0.03 % OP SOLN Both Eyes Place 1 drop into both eyes at bedtime.      Marland Kitchen ESOMEPRAZOLE MAGNESIUM 40 MG PO CPDR Oral Take 40 mg by mouth 2 (two) times daily with a meal.    . FUROSEMIDE 20 MG PO TABS Oral Take 20 mg by mouth daily.    Marland Kitchen HYDROCODONE-ACETAMINOPHEN 5-325 MG PO TABS Oral Take 1 tablet by mouth 4 (four) times daily.     Marland Kitchen PRAVASTATIN SODIUM 40 MG PO TABS Oral Take 40 mg by mouth daily.      . OXYCODONE-ACETAMINOPHEN 5-325 MG PO TABS Oral Take 1-2 tablets by mouth every 6 (six) hours as needed for pain. 15 tablet 0    BP 162/75  Pulse 100  Temp(Src) 97.4 F (36.3 C) (Oral)  Resp 20  Ht 5\' 3"  (1.6 m)  Wt 145 lb (65.772 kg)  BMI 25.69 kg/m2  SpO2 99%  Physical Exam  Nursing note and vitals reviewed. Constitutional: She is oriented to person, place, and time. She appears well-developed and well-nourished.  HENT:  Head: Normocephalic and atraumatic.  Eyes: Conjunctivae and EOM are normal.  Neck: Neck supple.  Cardiovascular: Normal rate and regular rhythm.   Pulmonary/Chest: Effort normal and breath sounds normal. No respiratory distress.  Abdominal: Soft. There is no tenderness. There is no rebound and no guarding.  Musculoskeletal: Normal range of motion. She exhibits edema.       Pitting edema right lower extremity, lower lumbar tenderness, no crepitus, left hip with FROM, good dp pulse bilaterally, sensation intact    Neurological: She is alert and oriented to person, place, and time. No sensory deficit. Coordination normal.  Skin: Skin is warm and dry.  Psychiatric: She has a normal mood and  affect. Her behavior is normal.    ED Course  Procedures (including critical care time)   DIAGNOSTIC STUDIES: Oxygen Saturation is 99% on room air, normal by my interpretation.     COORDINATION OF CARE: 1212  Physical exam complete.  Will xray lumbar spine.     LABS / RADIOLOGY:   Labs Reviewed - No data to display Dg Lumbar Spine Complete  06/26/2011  *RADIOLOGY REPORT*  Clinical Data: Back pain  LUMBAR SPINE - COMPLETE 4+ VIEW  Comparison: 09/30/2010  Findings:  There are cholecystectomy clips within the right upper quadrant of the abdomen. Advanced calcified atherosclerotic disease is noted affecting the abdominal aorta and its branches.  There is a first- degree anterolisthesis of L5 on S1.  Multilevel disc space narrowing and ventral spurring is noted compatible with degenerative disc disease.No fractures or subluxations identified.  IMPRESSION:  1.  Lumbar spondylosis. 2.  No acute findings.  Original Report Authenticated By: Rosealee Albee, M.D.         MDM  Patient with pain in her right knee right hip right back right shoulder right neck. Lumbar spine x-ray done and showed just chronic changes. No new injury. Left lower extremity has some edema, this is chronic for her. She's previously had a Doppler to evaluate for DVT. It was negative. I do not believe that she needs another evaluation this time. She does not have a history of renal insufficiency. She'll followup with her doctor as needed.      MEDICATIONS GIVEN IN THE E.D. Scheduled Meds:    .  HYDROmorphone (DILAUDID) injection  0.5 mg Intramuscular Once   Continuous Infusions:      IMPRESSION: 1. Back pain   2. Neck pain   3. Lower extremity edema      NEW MEDICATIONS: New Prescriptions   OXYCODONE-ACETAMINOPHEN (PERCOCET) 5-325 MG PER TABLET    Take 1-2 tablets by mouth every 6 (six) hours as needed for pain.      I personally performed the services described in this documentation, which was  scribed in my presence. The recorded information has been reviewed and considered.        Sheila Myers. Rubin Payor, MD 06/26/11 1325

## 2011-08-20 ENCOUNTER — Encounter: Payer: Self-pay | Admitting: Cardiology

## 2011-08-21 ENCOUNTER — Encounter: Payer: Self-pay | Admitting: Cardiology

## 2011-08-21 ENCOUNTER — Ambulatory Visit (INDEPENDENT_AMBULATORY_CARE_PROVIDER_SITE_OTHER): Payer: PRIVATE HEALTH INSURANCE | Admitting: Cardiology

## 2011-08-21 ENCOUNTER — Encounter: Payer: Self-pay | Admitting: *Deleted

## 2011-08-21 VITALS — BP 154/74 | HR 84 | Ht 63.0 in | Wt 142.0 lb

## 2011-08-21 DIAGNOSIS — E785 Hyperlipidemia, unspecified: Secondary | ICD-10-CM | POA: Insufficient documentation

## 2011-08-21 DIAGNOSIS — I10 Essential (primary) hypertension: Secondary | ICD-10-CM | POA: Insufficient documentation

## 2011-08-21 DIAGNOSIS — E119 Type 2 diabetes mellitus without complications: Secondary | ICD-10-CM

## 2011-08-21 DIAGNOSIS — J438 Other emphysema: Secondary | ICD-10-CM

## 2011-08-21 DIAGNOSIS — K589 Irritable bowel syndrome without diarrhea: Secondary | ICD-10-CM | POA: Insufficient documentation

## 2011-08-21 DIAGNOSIS — I7090 Unspecified atherosclerosis: Secondary | ICD-10-CM

## 2011-08-21 DIAGNOSIS — K219 Gastro-esophageal reflux disease without esophagitis: Secondary | ICD-10-CM | POA: Insufficient documentation

## 2011-08-21 DIAGNOSIS — I2510 Atherosclerotic heart disease of native coronary artery without angina pectoris: Secondary | ICD-10-CM | POA: Insufficient documentation

## 2011-08-21 DIAGNOSIS — R079 Chest pain, unspecified: Secondary | ICD-10-CM

## 2011-08-21 DIAGNOSIS — F411 Generalized anxiety disorder: Secondary | ICD-10-CM

## 2011-08-21 NOTE — Patient Instructions (Addendum)
Your physician recommends that you schedule a follow-up appointment in: 3 weeks  Your physician recommends that you return for lab work in: Within the week  Stress Myoview

## 2011-08-21 NOTE — Assessment & Plan Note (Signed)
Control of blood pressure has been consistently suboptimal.  Additional antihypertensive medication will be given.

## 2011-08-21 NOTE — Assessment & Plan Note (Signed)
Current symptoms are atypical, but accuracy of the history is difficult to evaluate.  Exertional dyspnea could be an anginal equivalent.  We will proceed with a stress nuclear study to evaluate for previous infarction, active ischemia and left ventricular systolic function.

## 2011-08-21 NOTE — Progress Notes (Signed)
Patient ID: Sheila Myers, female   DOB: 1928-07-04, 78 y.o.   MRN: [id number]  HPI: Initial Cardiology evaluation with me performed at the kind request of Dr. [name] for reassessment of cardiovascular disease.  This nice woman was previously a patient of Dr. Daleen Squibb, but has not seen any cardiologist for a number of years.  She underwent two-vessel angioplasty in 2001, had a subsequent catheterization that showed some restenosis, but has been treated medically since with excellent results.  She's had occasional chest discomfort that is atypical and that she attributes to muscle spasm rather than any cardiac etiology.  She does have exertional dyspnea, but this has improved since she started an exercise program.  She notes some tachycardic palpitations that are unassociated with other symptoms or activity, but they tend to occur when she is anxious.  She uses benzodiazepines occasionally, but cannot say for certain that they improve her symptoms.  She's recently been diagnosed with mild diabetes, treated with a diabetic diet for now.  CBGs typically ranged from 125 pre-meal to 165 postprandially.  Patient also notes frequent headaches.  Current Outpatient Prescriptions on File Prior to Visit  Medication Sig Dispense Refill  . albuterol (PROVENTIL) (2.5 MG/3ML) 0.083% nebulizer solution Take 2.5 mg by nebulization 4 (four) times daily.      . bimatoprost (LUMIGAN) 0.03 % ophthalmic drops Place 1 drop into both eyes at bedtime.        Marland Kitchen esomeprazole (NEXIUM) 40 MG capsule Take 40 mg by mouth 2 (two) times daily with a meal.      . HYDROcodone-acetaminophen (NORCO) 5-325 MG per tablet Take 1 tablet by mouth 4 (four) times daily.       . pravastatin (PRAVACHOL) 40 MG tablet Take 40 mg by mouth daily.        Marland Kitchen torsemide (DEMADEX) 20 MG tablet Take 20 mg by mouth daily.       Allergies  Allergen Reactions  . Peanut-Containing Drug Products Anaphylaxis  . Codeine Other (See Comments)    "I just freak out"    . Morphine Other (See Comments)    "I just freak out"     Past Medical History  Diagnosis Date  . Irritable bowel syndrome     constipation alternating with diarrhea  . Gastroesophageal reflux disease     Diverticulosis, hemorrhoids with rectal bleeding,  . Hyperlipidemia   . Glaucoma   . ASCVD (arteriosclerotic cardiovascular disease)     H/o PCI  . Diabetes mellitus type II   . Hypertension   . Degenerative joint disease     Right shoulder pain-mechanism unclear  . Depression   . Chronic obstructive pulmonary disease     Past Surgical History  Procedure Date  . Abdominal hysterectomy   . Appendectomy   . Cholecystectomy   . Colonoscopy 2007    Negative screening study except for diverticulosis    Family History  Problem Relation Age of Onset  . Aneurysm Mother     Cerebral  . Hypertension Mother     History   Social History  . Marital Status: Widowed    Spouse Name: N/A    Number of Children: N/A  . Years of Education: N/A   Occupational History  . Retired CNA     [employer]   Social History Main Topics  . Smoking status: Never Smoker   . Smokeless tobacco: Never Used  . Alcohol Use: No  . Drug Use: No  . Sexually Active: No  Other Topics Concern  . Not on file   Social History Narrative  . No narrative on file    ROS: Dyspnea with moderate exercise; abdominal discomfort, back pain, arthritic discomfort, seasonal allergies and constipation attributable to diverticular disease.    All other systems reviewed and are negative.  PHYSICAL EXAM: BP 154/74  Pulse 84  Ht 5\' 3"  (1.6 m)  Wt 64.411 kg (142 lb)  BMI 25.15 kg/m2  General-Well-developed; no acute distress Body Habitus-proportionate weight and height HEENT-[place]/AT; PERRL; EOM intact; conjunctiva and lids nl; multiple dental extractions Neck-No JVD; no carotid bruits Endocrine-No thyromegaly Lungs-Clear lung fields; resonant percussion; normal I-to-E ratio Cardiovascular- normal  PMI; normal S1 and S2; grade 2/6 basilar systolic ejection murmur Abdomen-BS normal; soft and non-tender without masses or organomegaly Musculoskeletal-No deformities, cyanosis or clubbing Neurologic-Nl cranial nerves; symmetric strength and tone Skin- Warm, no significant lesions Extremities-Nl distal pulses; no edema  EKG:  Normal sinus rhythm, left atrial abnormality, probable LVH, delayed R-wave progression, prior inferior MI.  No previous tracing for comparison   ASSESSMENT AND PLAN:  [place] Bing, MD 08/21/2011 2:47 PM

## 2011-08-21 NOTE — Assessment & Plan Note (Signed)
No lipid profile results are available.  Testing will be performed.

## 2011-08-21 NOTE — Progress Notes (Deleted)
Name: Sheila Myers    DOB: Nov 01, 1928  Age: 78 y.o.  MR#: [id number]       PCP:  Fredirick Maudlin, MD      Insurance: @PAYORNAME @   CC:    Chief Complaint  Patient presents with  . CAD    Referral from [facility] for Chest pain  . Hypertention    VS BP 154/74  Pulse 84  Ht 5\' 3"  (1.6 m)  Wt 142 lb (64.411 kg)  BMI 25.15 kg/m2  Weights Current Weight  08/21/11 142 lb (64.411 kg)  06/26/11 145 lb (65.772 kg)  09/30/10 149 lb (67.586 kg)    Blood Pressure  BP Readings from Last 3 Encounters:  08/21/11 154/74  06/26/11 162/75  09/30/10 149/90     Admit date:  (Not on file) Last encounter with RMR:  08/20/2011   Allergy Allergies  Allergen Reactions  . Peanut-Containing Drug Products Anaphylaxis  . Codeine Other (See Comments)    "I just freak out"  . Morphine Other (See Comments)    "I just freak out"    Current Outpatient Prescriptions  Medication Sig Dispense Refill  . albuterol (PROVENTIL) (2.5 MG/3ML) 0.083% nebulizer solution Take 2.5 mg by nebulization 4 (four) times daily.      Marland Kitchen ALPRAZolam (XANAX) 0.5 MG tablet Take 0.5 mg by mouth 3 (three) times daily as needed.      Marland Kitchen amLODipine-benazepril (LOTREL) 10-20 MG per capsule Take 1 capsule by mouth daily.      . bimatoprost (LUMIGAN) 0.03 % ophthalmic drops Place 1 drop into both eyes at bedtime.        Marland Kitchen esomeprazole (NEXIUM) 40 MG capsule Take 40 mg by mouth 2 (two) times daily with a meal.      . HYDROcodone-acetaminophen (NORCO) 5-325 MG per tablet Take 1 tablet by mouth 4 (four) times daily.       . pravastatin (PRAVACHOL) 40 MG tablet Take 40 mg by mouth daily.        Marland Kitchen torsemide (DEMADEX) 20 MG tablet Take 20 mg by mouth daily.        Discontinued Meds:    Medications Discontinued During This Encounter  Medication Reason  . furosemide (LASIX) 20 MG tablet Discontinued by provider  . amLODipine-benazepril (LOTREL) 5-20 MG per capsule Dose change  . ALPRAZolam (XANAX) 1 MG tablet Dose change     Patient Active Problem List  Diagnosis  . ANXIETY  . EMPHYSEMA  . BACK PAIN, CHRONIC  . Irritable bowel syndrome  . Gastroesophageal reflux disease  . Arteriosclerotic cardiovascular disease (ASCVD)  . Diabetes mellitus type II  . Hypertension  . Hyperlipidemia    LABS No visits with results within 3 Month(s) from this visit. Latest known visit with results is:  Admission on 05/11/2010, Discharged on 05/11/2010  Component Date Value  . Glucose-Capillary 05/11/2010 120*     Results for this Opt Visit:     Results for orders placed during the hospital encounter of 05/11/10  GLUCOSE, CAPILLARY      Component Value Range   Glucose-Capillary 120 (*) 70 - 99 mg/dL    EKG Orders placed in visit on 11/15/03  . EKG     Prior Assessment and Plan Problem List as of 08/21/2011            Cardiology Problems   Arteriosclerotic cardiovascular disease (ASCVD)   Hypertension   Hyperlipidemia     Other   ANXIETY   EMPHYSEMA   BACK PAIN, CHRONIC  Irritable bowel syndrome   Gastroesophageal reflux disease   Diabetes mellitus type II       Imaging: No results found.   FRS Calculation: Score not calculated

## 2011-08-21 NOTE — Assessment & Plan Note (Signed)
Near optimal hemoglobin A1c level suggests excellent control of diabetes.

## 2011-08-22 ENCOUNTER — Telehealth: Payer: Self-pay | Admitting: *Deleted

## 2011-08-22 MED ORDER — CLONIDINE HCL 0.1 MG PO TABS: 0.1000 mg | ORAL_TABLET | Freq: Two times a day (BID) | ORAL | Status: DC

## 2011-08-22 NOTE — Telephone Encounter (Signed)
Patient advised of medication change.

## 2011-08-22 NOTE — Telephone Encounter (Signed)
Message copied by Gaynelle Adu on Thu Aug 22, 2011  9:33 AM ------      Message from: Kathlen Brunswick      Created: Wed Aug 21, 2011  7:46 PM      Regarding: Rx       Armie Moren,            I neglected to order clonidine 0.1 mg twice a day for Ms. Zimmers.            RR

## 2011-08-22 NOTE — Telephone Encounter (Signed)
Message left for patient to return call.

## 2011-08-28 ENCOUNTER — Ambulatory Visit (INDEPENDENT_AMBULATORY_CARE_PROVIDER_SITE_OTHER): Payer: PRIVATE HEALTH INSURANCE

## 2011-08-28 ENCOUNTER — Encounter (HOSPITAL_COMMUNITY)
Admission: RE | Admit: 2011-08-28 | Discharge: 2011-08-28 | Disposition: A | Discharge status: Home / Self Care | Payer: PRIVATE HEALTH INSURANCE | Source: Ambulatory Visit | Attending: Cardiology | Admitting: Cardiology

## 2011-08-28 ENCOUNTER — Encounter (HOSPITAL_COMMUNITY): Payer: Self-pay | Admitting: Cardiology

## 2011-08-28 ENCOUNTER — Encounter (HOSPITAL_COMMUNITY): Payer: Self-pay

## 2011-08-28 DIAGNOSIS — I2510 Atherosclerotic heart disease of native coronary artery without angina pectoris: Secondary | ICD-10-CM

## 2011-08-28 DIAGNOSIS — R079 Chest pain, unspecified: Secondary | ICD-10-CM

## 2011-08-28 MED ORDER — TECHNETIUM TC 99M TETROFOSMIN IV KIT
10.0000 | PACK | Freq: Once | INTRAVENOUS | Status: AC | PRN
  Administered 2011-08-28: 10 via INTRAVENOUS

## 2011-08-28 MED ORDER — TECHNETIUM TC 99M TETROFOSMIN IV KIT
30.0000 | PACK | Freq: Once | INTRAVENOUS | Status: AC | PRN
  Administered 2011-08-28: 28 via INTRAVENOUS

## 2011-08-28 NOTE — Progress Notes (Signed)
Stress Lab Nurses Notes - Sheila Myers  Sheila Myers 08/28/2011 Reason for doing test: CAD, Chest Pain and Dyspnea Type of test: Stress Myoview and Test Changed unable to reach THR. [name] given Nurse performing test: Parke Poisson, RN Nuclear Medicine Tech: Lou Cal Echo Tech: Not Applicable MD performing test: R. Dietrich Pates Family MD: [name] Test explained and consent signed: yes IV started: 22g jelco, Saline lock flushed, No redness or edema and Saline lock started in radiology Symptoms: fatigue on TM. Dizziness and Stomach discomfort with Lexiscan Treatment/Intervention: None Reason test stopped: protocol completed After recovery IV was: Discontinued via X-ray tech and No redness or edema Patient to return to Nuc. Med at : 12:30 Patient discharged: Home Patient's Condition upon discharge was: stable Comments: During test BP 155/70 & HR 108.  Recovery BP 128/64 & HR 80.  Symptoms resolved in recovery. [name]

## 2011-09-02 ENCOUNTER — Encounter: Payer: Self-pay | Admitting: *Deleted

## 2011-09-09 ENCOUNTER — Other Ambulatory Visit: Payer: Self-pay | Admitting: Cardiology

## 2011-09-10 LAB — LIPID PANEL
Cholesterol: 180 mg/dL (ref 0–200)
VLDL: 14 mg/dL (ref 0–40)

## 2011-09-10 LAB — CBC
HCT: 41.9 % (ref 36.0–46.0)
MCHC: 31.5 g/dL (ref 30.0–36.0)
MCV: 70.4 fL — ABNORMAL LOW (ref 78.0–100.0)
RDW: 17.1 % — ABNORMAL HIGH (ref 11.5–15.5)

## 2011-09-10 LAB — COMPREHENSIVE METABOLIC PANEL
ALT: 11 U/L (ref 0–35)
CO2: 31 mEq/L (ref 19–32)
Calcium: 9.8 mg/dL (ref 8.4–10.5)
Chloride: 103 mEq/L (ref 96–112)
Sodium: 142 mEq/L (ref 135–145)
Total Protein: 6.9 g/dL (ref 6.0–8.3)

## 2011-09-11 ENCOUNTER — Encounter: Payer: Self-pay | Admitting: *Deleted

## 2011-09-11 ENCOUNTER — Encounter: Payer: Self-pay | Admitting: Cardiology

## 2011-09-16 ENCOUNTER — Ambulatory Visit (INDEPENDENT_AMBULATORY_CARE_PROVIDER_SITE_OTHER): Payer: Medicaid Other | Admitting: Cardiology

## 2011-09-16 ENCOUNTER — Encounter: Payer: Self-pay | Admitting: Cardiology

## 2011-09-16 VITALS — BP 150/72 | HR 80 | Ht 63.0 in | Wt 144.8 lb

## 2011-09-16 DIAGNOSIS — I7090 Unspecified atherosclerosis: Secondary | ICD-10-CM

## 2011-09-16 DIAGNOSIS — I2510 Atherosclerotic heart disease of native coronary artery without angina pectoris: Secondary | ICD-10-CM

## 2011-09-16 DIAGNOSIS — I10 Essential (primary) hypertension: Secondary | ICD-10-CM

## 2011-09-16 MED ORDER — METOPROLOL SUCCINATE ER 25 MG PO TB24: 25.0000 mg | ORAL_TABLET | Freq: Every day | ORAL | Status: DC

## 2011-09-16 MED ORDER — PRAVASTATIN SODIUM 80 MG PO TABS: 80.0000 mg | ORAL_TABLET | Freq: Every day | ORAL | Status: DC

## 2011-09-16 NOTE — Progress Notes (Deleted)
Name: CANDY LEVERETT    DOB: May 04, 1928  Age: 78 y.o.  MR#: [id number]       PCP:  Fredirick Maudlin, MD      Insurance: @PAYORNAME @   CC:   No chief complaint on file.   VS BP 150/72  Pulse 80  Ht 5\' 3"  (1.6 m)  Wt 144 lb 12 oz (65.658 kg)  BMI 25.64 kg/m2  Weights Current Weight  09/16/11 144 lb 12 oz (65.658 kg)  08/21/11 142 lb (64.411 kg)  06/26/11 145 lb (65.772 kg)    Blood Pressure  BP Readings from Last 3 Encounters:  09/16/11 150/72  08/21/11 154/74  06/26/11 162/75     Admit date:  (Not on file) Last encounter with RMR:  09/11/2011   Allergy Allergies  Allergen Reactions  . Peanut-Containing Drug Products Anaphylaxis  . Codeine Other (See Comments)    "I just freak out"  . Morphine Other (See Comments)    "I just freak out"    Current Outpatient Prescriptions  Medication Sig Dispense Refill  . albuterol (PROVENTIL) (2.5 MG/3ML) 0.083% nebulizer solution Take 2.5 mg by nebulization 4 (four) times daily.      Marland Kitchen ALPRAZolam (XANAX) 0.5 MG tablet Take 0.5 mg by mouth 3 (three) times daily as needed.      Marland Kitchen amLODipine-benazepril (LOTREL) 10-20 MG per capsule Take 1 capsule by mouth daily.      . bimatoprost (LUMIGAN) 0.03 % ophthalmic drops Place 1 drop into both eyes at bedtime.        . cloNIDine (CATAPRES) 0.1 MG tablet Take 1 tablet (0.1 mg total) by mouth 2 (two) times daily.  60 tablet  11  . esomeprazole (NEXIUM) 40 MG capsule Take 40 mg by mouth 2 (two) times daily with a meal.      . HYDROcodone-acetaminophen (NORCO) 5-325 MG per tablet Take 1 tablet by mouth 4 (four) times daily.       . pravastatin (PRAVACHOL) 40 MG tablet Take 40 mg by mouth daily.        Marland Kitchen torsemide (DEMADEX) 20 MG tablet Take 20 mg by mouth daily.        Discontinued Meds:   There are no discontinued medications.  Patient Active Problem List  Diagnosis  . ANXIETY  . EMPHYSEMA  . BACK PAIN, CHRONIC  . Irritable bowel syndrome  . Gastroesophageal reflux disease  .  Arteriosclerotic cardiovascular disease (ASCVD)  . Diabetes mellitus type II  . Hypertension  . Hyperlipidemia    LABS Orders Only on 09/09/2011  Component Date Value  . WBC 09/09/2011 8.3   . RBC 09/09/2011 5.95*  . Hemoglobin 09/09/2011 13.2   . HCT 09/09/2011 41.9   . MCV 09/09/2011 70.4*  . MCH 09/09/2011 22.2*  . MCHC 09/09/2011 31.5   . RDW 09/09/2011 17.1*  . Platelets 09/09/2011 297   . Sodium 09/09/2011 142   . Potassium 09/09/2011 3.8   . Chloride 09/09/2011 103   . CO2 09/09/2011 31   . Glucose, Bld 09/09/2011 127*  . BUN 09/09/2011 16   . Creat 09/09/2011 0.85   . Total Bilirubin 09/09/2011 0.4   . Alkaline Phosphatase 09/09/2011 60   . AST 09/09/2011 13   . ALT 09/09/2011 11   . Total Protein 09/09/2011 6.9   . Albumin 09/09/2011 4.0   . Calcium 09/09/2011 9.8   . Cholesterol 09/09/2011 180   . Triglycerides 09/09/2011 71   . HDL 09/09/2011 55   . Total CHOL/HDL  Ratio 09/09/2011 3.3   . VLDL 09/09/2011 14   . LDL Cholesterol 09/09/2011 111*  . TSH 09/09/2011 0.627      Results for this Opt Visit:     Results for orders placed in visit on 09/09/11  CBC      Component Value Range   WBC 8.3  4.0 - 10.5 K/uL   RBC 5.95 (*) 3.87 - 5.11 MIL/uL   Hemoglobin 13.2  12.0 - 15.0 g/dL   HCT 16.1  09.6 - 04.5 %   MCV 70.4 (*) 78.0 - 100.0 fL   MCH 22.2 (*) 26.0 - 34.0 pg   MCHC 31.5  30.0 - 36.0 g/dL   RDW 40.9 (*) 81.1 - 91.4 %   Platelets 297  150 - 400 K/uL  COMPREHENSIVE METABOLIC PANEL      Component Value Range   Sodium 142  135 - 145 mEq/L   Potassium 3.8  3.5 - 5.3 mEq/L   Chloride 103  96 - 112 mEq/L   CO2 31  19 - 32 mEq/L   Glucose, Bld 127 (*) 70 - 99 mg/dL   BUN 16  6 - 23 mg/dL   Creat 7.82  9.56 - 2.13 mg/dL   Total Bilirubin 0.4  0.3 - 1.2 mg/dL   Alkaline Phosphatase 60  39 - 117 U/L   AST 13  0 - 37 U/L   ALT 11  0 - 35 U/L   Total Protein 6.9  6.0 - 8.3 g/dL   Albumin 4.0  3.5 - 5.2 g/dL   Calcium 9.8  8.4 - 08.6 mg/dL  LIPID  PANEL      Component Value Range   Cholesterol 180  0 - 200 mg/dL   Triglycerides 71  <578 mg/dL   HDL 55  >46 mg/dL   Total CHOL/HDL Ratio 3.3     VLDL 14  0 - 40 mg/dL   LDL Cholesterol 962 (*) 0 - 99 mg/dL  TSH      Component Value Range   TSH 0.627  0.350 - 4.500 uIU/mL    EKG Orders placed in visit on 08/21/11  . EKG 12-LEAD     Prior Assessment and Plan Problem List as of 09/16/2011            Cardiology Problems   Arteriosclerotic cardiovascular disease (ASCVD)   Last Assessment & Plan Note   08/21/2011 Office Visit Signed 08/21/2011  4:37 PM by Kathlen Brunswick, MD    Current symptoms are atypical, but accuracy of the history is difficult to evaluate.  Exertional dyspnea could be an anginal equivalent.  We will proceed with a stress nuclear study to evaluate for previous infarction, active ischemia and left ventricular systolic function.    Hypertension   Last Assessment & Plan Note   08/21/2011 Office Visit Signed 08/21/2011  4:39 PM by Kathlen Brunswick, MD    Control of blood pressure has been consistently suboptimal.  Additional antihypertensive medication will be given.    Hyperlipidemia   Last Assessment & Plan Note   08/21/2011 Office Visit Signed 08/21/2011  4:41 PM by Kathlen Brunswick, MD    No lipid profile results are available.  Testing will be performed.      Other   ANXIETY   EMPHYSEMA   BACK PAIN, CHRONIC   Irritable bowel syndrome   Gastroesophageal reflux disease   Diabetes mellitus type II   Last Assessment & Plan Note   08/21/2011 Office Visit Signed  08/21/2011  7:45 PM by Kathlen Brunswick, MD    Near optimal hemoglobin A1c level suggests excellent control of diabetes.        Imaging: Nm Myocar Single W/spect W/wall Motion And Ef  08/29/2011  Ordering Physician: [place] Bing  Reading Physician: [place] Bing  Clinical Data: 78 year old woman with a known history of coronary artery disease, having undergone P C I in 2001.  Now presents  with recurrent chest discomfort.  NUCLEAR MEDICINE STRESS (Combined exercise and pharmacologic) MYOVIEW STUDY WITH SPECT AND LEFT VENTRICULAR EJECTION FRACTION  Radionuclide Data: One-day rest/stress protocol performed with 10/30 mCi of Tc-63m Myoview.  Stress Data: Treadmill exercise performed to a workload of 4.6 mets and a heart rate of 102, 71% of age predicted maximum.  Exercise discontinued due to marked fatigue and mild dyspnea. Since exercise produced an inadequate heart rate response, Regadenoson was administered during low level exertion. Resting blood pressure of 150/70 initially fell to 140/70 but subsequently increased to 155/70.  EKG: Normal sinus rhythm; left atrial abnormality; delayed R-wave progression; nondiagnostic inferior Q-waves; nonspecific T-wave abnormality. Stress EKG:  Interpretation is somewhat impaired by artifact; no significant changes identified.  Rare PVCs occurred in recovery, but there were no significant arrhythmias.  Scintigraphic Data: Acquisition notable for mild breast and diaphragmatic attenuation.  Left ventricular size was normal.  On tomographic images reconstructed in standard planes, there was uniform and normal uptake of tracer in all myocardial segments. The rest images were unchanged.  The gated reconstruction demonstrated normal to hyperdynamic regional and global LV systolic function as well as normal systolic accentuation of activity throughout.  Estimated ejection fraction exceeded 65%.  IMPRESSION: Negative combined exercise and pharmacologic stress nuclear myocardial study.  Original Report Authenticated By: Raelyn Mora Calculation: Score not calculated

## 2011-09-16 NOTE — Progress Notes (Signed)
Patient ID: Sheila Myers, female   DOB: 1928/06/22, 78 y.o.   MRN: [id number]  HPI: Patient returns for continued assessment of coronary artery disease and cardiovascular risk factors.  She continues to have class II dyspnea on exertion, which is chronic.  She has not had any significant recurrent chest discomfort.  A stress nuclear study was negative for ischemia.  She developed dry mouth when taking low-dose clonidine and has discontinued that medication.  Systolic hypertension is typically present at home with pressures of 140-180.  Prior to Admission medications   Medication Sig Start Date End Date Taking? Authorizing Provider  albuterol (PROVENTIL) (2.5 MG/3ML) 0.083% nebulizer solution Take 2.5 mg by nebulization 4 (four) times daily.   Yes Historical Provider, MD  ALPRAZolam Prudy Feeler) 0.5 MG tablet Take 0.5 mg by mouth 3 (three) times daily as needed.   Yes Historical Provider, MD  amLODipine-benazepril (LOTREL) 10-20 MG per capsule Take 1 capsule by mouth daily.   Yes Historical Provider, MD  bimatoprost (LUMIGAN) 0.03 % ophthalmic drops Place 1 drop into both eyes at bedtime.     Yes Historical Provider, MD  cloNIDine (CATAPRES) 0.1 MG tablet Take 1 tablet (0.1 mg total) by mouth 2 (two) times daily. 08/22/11 08/21/12 Yes Kathlen Brunswick, MD  esomeprazole (NEXIUM) 40 MG capsule Take 40 mg by mouth 2 (two) times daily with a meal. 02/26/11  Yes Tiffany Kocher, PA  HYDROcodone-acetaminophen (NORCO) 5-325 MG per tablet Take 1 tablet by mouth 4 (four) times daily.    Yes Historical Provider, MD  pravastatin (PRAVACHOL) 40 MG tablet Take 40 mg by mouth daily.     Yes Historical Provider, MD  torsemide (DEMADEX) 20 MG tablet Take 20 mg by mouth daily.   Yes Historical Provider, MD   Allergies  Allergen Reactions  . Peanut-Containing Drug Products Anaphylaxis  . Codeine Other (See Comments)    "I just freak out"  . Morphine Other (See Comments)    "I just freak out"     Past medical  history, social history, and family history reviewed and updated.  ROS: Denies orthopnea, PND, pedal edema, claudication, focal neurologic signs, history of TIA or stroke.  All other systems reviewed and are negative.  PHYSICAL EXAM: BP 150/72  Pulse 80  Ht 5\' 3"  (1.6 m)  Wt 65.658 kg (144 lb 12 oz)  BMI 25.64 kg/m2  General-Well developed; no acute distress Body habitus-proportionate weight and height Neck-No JVD; Right carotid bruit; ectatic right carotid Lungs-clear lung fields; resonant to percussion Cardiovascular-normal PMI; normal S1 and S2; grade 1-2/6 systolic ejection murmur at the cardiac base; regular rhythm Abdomen-normal bowel sounds; soft and non-tender without masses or organomegaly Musculoskeletal-No deformities, no cyanosis or clubbing Neurologic-Normal cranial nerves; symmetric strength and tone Skin-Warm, no significant lesions Extremities-distal pulses intact; Minimal edema  ASSESSMENT AND PLAN:  [place] Bing, MD 09/16/2011 11:10 AM

## 2011-09-16 NOTE — Assessment & Plan Note (Addendum)
Atypical chest pain that likely does not reflect myocardial ischemia.  Normal left ventricular systolic function.  Dyspnea probably related to physical deconditioning and chronic lung disease.  We will continue to pursue optimal control of cardiovascular risk factors.

## 2011-09-16 NOTE — Patient Instructions (Addendum)
Your physician recommends that you schedule a follow-up appointment in: 4 months  Your physician has requested that you have a carotid duplex. This test is an ultrasound of the carotid arteries in your neck. It looks at blood flow through these arteries that supply the brain with blood. Allow one hour for this exam. There are no restrictions or special instructions.  Blood pressure check in 1 month  Your physician has requested that you regularly monitor and record your blood pressure readings at home. Please use the same machine at the same time of day to check your readings and record them to bring to your follow-up visit.  Your physician has recommended you make the following change in your medication:  1 - START Toprol 25 mg daily 2 - INCREASE Pravastatin to 80 mg daily

## 2011-09-16 NOTE — Assessment & Plan Note (Signed)
Inadequate control of hypertension.  Although she has experienced symptomatic bradycardia in the past, low dose beta blocker will be added to her regime.  She will probably require an additional antihypertensive medication.  She will return for a blood pressure check in one month and to see me in 4 months.

## 2011-09-23 ENCOUNTER — Ambulatory Visit (HOSPITAL_COMMUNITY)
Admission: RE | Admit: 2011-09-23 | Discharge: 2011-09-23 | Disposition: A | Discharge status: Home / Self Care | Payer: PRIVATE HEALTH INSURANCE | Source: Ambulatory Visit | Attending: Cardiology | Admitting: Cardiology

## 2011-09-23 DIAGNOSIS — R0989 Other specified symptoms and signs involving the circulatory and respiratory systems: Secondary | ICD-10-CM | POA: Insufficient documentation

## 2011-09-23 DIAGNOSIS — I10 Essential (primary) hypertension: Secondary | ICD-10-CM

## 2011-09-23 DIAGNOSIS — I2510 Atherosclerotic heart disease of native coronary artery without angina pectoris: Secondary | ICD-10-CM

## 2011-09-23 DIAGNOSIS — F17200 Nicotine dependence, unspecified, uncomplicated: Secondary | ICD-10-CM | POA: Insufficient documentation

## 2011-09-23 DIAGNOSIS — E119 Type 2 diabetes mellitus without complications: Secondary | ICD-10-CM | POA: Insufficient documentation

## 2011-09-28 ENCOUNTER — Encounter: Payer: Self-pay | Admitting: Cardiology

## 2011-09-28 DIAGNOSIS — E041 Nontoxic single thyroid nodule: Secondary | ICD-10-CM | POA: Insufficient documentation

## 2011-09-28 DIAGNOSIS — I679 Cerebrovascular disease, unspecified: Secondary | ICD-10-CM | POA: Insufficient documentation

## 2011-10-11 ENCOUNTER — Other Ambulatory Visit (HOSPITAL_COMMUNITY): Payer: Self-pay | Admitting: Pulmonary Disease

## 2011-10-11 DIAGNOSIS — E041 Nontoxic single thyroid nodule: Secondary | ICD-10-CM

## 2011-10-14 ENCOUNTER — Ambulatory Visit (HOSPITAL_COMMUNITY): Payer: PRIVATE HEALTH INSURANCE

## 2011-10-15 ENCOUNTER — Other Ambulatory Visit (HOSPITAL_COMMUNITY): Payer: PRIVATE HEALTH INSURANCE

## 2011-10-17 ENCOUNTER — Ambulatory Visit (INDEPENDENT_AMBULATORY_CARE_PROVIDER_SITE_OTHER): Payer: PRIVATE HEALTH INSURANCE | Admitting: *Deleted

## 2011-10-17 VITALS — BP 148/80 | HR 71 | Ht 63.0 in | Wt 140.0 lb

## 2011-10-17 DIAGNOSIS — I10 Essential (primary) hypertension: Secondary | ICD-10-CM

## 2011-10-17 NOTE — Progress Notes (Signed)
Patient ID: Sheila Myers, female   DOB: 04-24-1928, 78 y.o.   MRN: [id number]  Increase metoprolol to 50 mg per day. Home BPs-bring list to next appointment. RN visit in one month.

## 2011-10-17 NOTE — Progress Notes (Signed)
Presents for blood pressure check.  States pressures have been good at home, however did not bring a list.  States, however that she has had a few elevated pressures since having thyroid studies, as she is worried.  Advised her to contact Dr [name] office regarding follow up with that.  No complaints noted at this time and states taking medications, as prescribed.

## 2011-10-18 ENCOUNTER — Telehealth: Payer: Self-pay | Admitting: *Deleted

## 2011-10-18 NOTE — Telephone Encounter (Signed)
Attempted to contact patient regarding recommendations.  No answer at this time.

## 2011-10-18 NOTE — Progress Notes (Signed)
Attempted to contact patient regarding recommendations.  No answer at this time. 

## 2011-10-21 MED ORDER — METOPROLOL SUCCINATE ER 50 MG PO TB24: 50.0000 mg | ORAL_TABLET | Freq: Every day | ORAL | Status: DC

## 2011-10-21 NOTE — Progress Notes (Signed)
Patient made aware of recommendations and verbalized understanding. 

## 2011-10-21 NOTE — Addendum Note (Signed)
Addended by: Reather Laurence A on: 10/21/2011 09:29 AM   Modules accepted: Orders

## 2011-11-20 ENCOUNTER — Ambulatory Visit (INDEPENDENT_AMBULATORY_CARE_PROVIDER_SITE_OTHER): Payer: PRIVATE HEALTH INSURANCE | Admitting: *Deleted

## 2011-11-20 VITALS — BP 138/60 | HR 72 | Ht 63.0 in | Wt 142.0 lb

## 2011-11-20 DIAGNOSIS — I10 Essential (primary) hypertension: Secondary | ICD-10-CM

## 2011-11-20 NOTE — Progress Notes (Signed)
Presents for a follow up blood pressure check from 9/19 nurse visit, where BP was 148/80, HR 71.  Toprol was increased to 50 mg, at that visit.  Medications reviewed and patient states taking, as prescribed.  4 blood pressures obtained, which were all over the board from 91/50 to 170/96.  Pt has been taking these at night, not in am after medication has been taken.  Advised her to continue current regimen and obtain additional blood pressures.  Verbalized understanding.

## 2011-12-02 NOTE — Progress Notes (Signed)
Patient ID: Sheila Myers, female   DOB: 02-01-1928, 78 y.o.   MRN: [id number]  Continue current Rx.

## 2012-01-17 ENCOUNTER — Encounter: Payer: Self-pay | Admitting: Cardiology

## 2012-01-17 ENCOUNTER — Ambulatory Visit (INDEPENDENT_AMBULATORY_CARE_PROVIDER_SITE_OTHER): Payer: PRIVATE HEALTH INSURANCE | Admitting: Cardiology

## 2012-01-17 VITALS — BP 146/84 | HR 75 | Ht 63.0 in | Wt 137.0 lb

## 2012-01-17 DIAGNOSIS — I2510 Atherosclerotic heart disease of native coronary artery without angina pectoris: Secondary | ICD-10-CM

## 2012-01-17 DIAGNOSIS — J438 Other emphysema: Secondary | ICD-10-CM

## 2012-01-17 DIAGNOSIS — I7090 Unspecified atherosclerosis: Secondary | ICD-10-CM

## 2012-01-17 DIAGNOSIS — E785 Hyperlipidemia, unspecified: Secondary | ICD-10-CM

## 2012-01-17 DIAGNOSIS — I10 Essential (primary) hypertension: Secondary | ICD-10-CM

## 2012-01-17 DIAGNOSIS — J439 Emphysema, unspecified: Secondary | ICD-10-CM

## 2012-01-17 MED ORDER — LOSARTAN POTASSIUM 50 MG PO TABS: 50.0000 mg | ORAL_TABLET | Freq: Every day | ORAL | Status: DC

## 2012-01-17 MED ORDER — ATORVASTATIN CALCIUM 80 MG PO TABS: 80.0000 mg | ORAL_TABLET | Freq: Every day | ORAL | Status: DC

## 2012-01-17 NOTE — Patient Instructions (Addendum)
Your physician recommends that you schedule a follow-up appointment in: 6 months  Blood Pressure Check in 1 month  Your physician has requested that you regularly monitor and record your blood pressure readings at home. Please use the same machine at the same time of day to check your readings and record them to bring to your follow-up visit.   Your physician has recommended you make the following change in your medication:  1 - START Losartan 50 mg daily 2 - STOP Pravastatin 3 - START Atorvastatin 40 mg daily

## 2012-01-17 NOTE — Progress Notes (Signed)
Patient ID: Sheila Myers, female   DOB: August 22, 1928, 78 y.o.   MRN: [id number]  HPI: Scheduled return visit for this very nice woman with coronary artery disease and multiple cardiovascular risk factors.  Since intervention nearly 13 years ago, she has had no significant problems with coronary disease and good control of cardiovascular risk factors.  She remains active with decent exercise tolerance.  She recently developed a severe URI, but has responded to outpatient treatment with Dr. [name] and is now recovering.  She describes nocturnal leg cramps in the size-her last metabolic profile 4 months ago was normal, and she is scheduled for repeat blood tests within the next few weeks.  Prior to Admission medications   Medication Sig Start Date End Date Taking? Authorizing Provider  albuterol (PROVENTIL) (2.5 MG/3ML) 0.083% nebulizer solution Take 2.5 mg by nebulization 4 (four) times daily.   Yes Historical Provider, MD  ALPRAZolam Prudy Feeler) 0.5 MG tablet Take 0.5 mg by mouth 3 (three) times daily as needed.   Yes Historical Provider, MD  amLODipine-benazepril (LOTREL) 10-20 MG per capsule Take 1 capsule by mouth daily.   Yes Historical Provider, MD  bimatoprost (LUMIGAN) 0.03 % ophthalmic drops Place 1 drop into both eyes at bedtime.     Yes Historical Provider, MD  esomeprazole (NEXIUM) 40 MG capsule Take 40 mg by mouth 2 (two) times daily with a meal. 02/26/11  Yes Tiffany Kocher, PA  HYDROcodone-acetaminophen (NORCO) 5-325 MG per tablet Take 1 tablet by mouth 4 (four) times daily.    Yes Historical Provider, MD  metFORMIN (GLUCOPHAGE) 500 MG tablet Take 500 mg by mouth daily with breakfast.   Yes Historical Provider, MD  metoprolol succinate (TOPROL-XL) 50 MG 24 hr tablet Take 1 tablet (50 mg total) by mouth daily. 10/21/11 10/20/12 Yes Kathlen Brunswick, MD  torsemide (DEMADEX) 20 MG tablet Take 20 mg by mouth daily.   Yes Historical Provider, MD  atorvastatin (LIPITOR) 80 MG tablet Take 1 tablet  (80 mg total) by mouth daily. 01/17/12   Kathlen Brunswick, MD  losartan (COZAAR) 50 MG tablet Take 1 tablet (50 mg total) by mouth daily. 01/17/12   Kathlen Brunswick, MD    Allergies  Allergen Reactions  . Peanut-Containing Drug Products Anaphylaxis  . Catapres (Clonidine Hcl) Other (See Comments)    Dry mouth  . Codeine Other (See Comments)    "I just freak out"  . Morphine Other (See Comments)    "I just freak out"      Past medical history, social history, and family history reviewed and updated.  ROS: Denies dyspnea, orthopnea, chest pain, PND, palpitations, lightheadedness or syncope.  She has some mild intermittent pedal edema.  She has no chronic bronchitic or asthmatic symptoms.  All other systems reviewed and are negative.  PHYSICAL EXAM: BP 146/84  Pulse 75  Ht 5\' 3"  (1.6 m)  Wt 62.143 kg (137 lb)  BMI 24.27 kg/m2  SpO2 95%   General-Well developed; no acute distress Body habitus-proportionate weight and height Neck-No JVD; no carotid bruits Lungs-clear lung fields; resonant to percussion Cardiovascular-normal PMI; normal S1 and S2; basilar systolic ejection murmur Abdomen-normal bowel sounds; soft and non-tender without masses or organomegaly Musculoskeletal-No deformities, no cyanosis or clubbing Neurologic-Normal cranial nerves; symmetric strength and tone Skin-Warm, no significant lesions Extremities-1-2+ distal pulses; Trace edema  ASSESSMENT AND PLAN:   Bing, MD 01/17/2012 11:39 AM

## 2012-01-17 NOTE — Assessment & Plan Note (Signed)
Lipid profile is suboptimal on high-dose pravastatin.  We will determine if atorvastatin can be obtained at a reasonable cost and order a dose of 40 mg per day.

## 2012-01-17 NOTE — Progress Notes (Deleted)
Name: Sheila Myers    DOB: 02-Dec-1928  Age: 78 y.o.  MR#: [id number]       PCP:  Fredirick Maudlin, MD      Insurance: @PAYORNAME @   CC:   No chief complaint on file.  MEDICATION LIST BLOOD PRESSURE LOG  VS BP 146/84  Pulse 75  Ht 5\' 3"  (1.6 m)  Wt 137 lb (62.143 kg)  BMI 24.27 kg/m2  SpO2 95%  Weights Current Weight  01/17/12 137 lb (62.143 kg)  11/20/11 142 lb (64.411 kg)  10/17/11 140 lb (63.504 kg)    Blood Pressure  BP Readings from Last 3 Encounters:  01/17/12 146/84  11/20/11 138/60  10/17/11 148/80     Admit date:  (Not on file) Last encounter with RMR:  09/28/2011   Allergy Allergies  Allergen Reactions  . Peanut-Containing Drug Products Anaphylaxis  . Catapres (Clonidine Hcl) Other (See Comments)    Dry mouth  . Codeine Other (See Comments)    "I just freak out"  . Morphine Other (See Comments)    "I just freak out"    Current Outpatient Prescriptions  Medication Sig Dispense Refill  . albuterol (PROVENTIL) (2.5 MG/3ML) 0.083% nebulizer solution Take 2.5 mg by nebulization 4 (four) times daily.      Marland Kitchen ALPRAZolam (XANAX) 0.5 MG tablet Take 0.5 mg by mouth 3 (three) times daily as needed.      Marland Kitchen amLODipine-benazepril (LOTREL) 10-20 MG per capsule Take 1 capsule by mouth daily.      . bimatoprost (LUMIGAN) 0.03 % ophthalmic drops Place 1 drop into both eyes at bedtime.        Marland Kitchen esomeprazole (NEXIUM) 40 MG capsule Take 40 mg by mouth 2 (two) times daily with a meal.      . HYDROcodone-acetaminophen (NORCO) 5-325 MG per tablet Take 1 tablet by mouth 4 (four) times daily.       . metFORMIN (GLUCOPHAGE) 500 MG tablet Take 500 mg by mouth daily with breakfast.      . metoprolol succinate (TOPROL-XL) 50 MG 24 hr tablet Take 1 tablet (50 mg total) by mouth daily.  90 tablet  3  . pravastatin (PRAVACHOL) 80 MG tablet Take 1 tablet (80 mg total) by mouth daily.  30 tablet  11  . torsemide (DEMADEX) 20 MG tablet Take 20 mg by mouth daily.        Discontinued  Meds:   There are no discontinued medications.  Patient Active Problem List  Diagnosis  . ANXIETY  . EMPHYSEMA  . BACK PAIN, CHRONIC  . Irritable bowel syndrome  . Gastroesophageal reflux disease  . Arteriosclerotic cardiovascular disease (ASCVD)  . Diabetes mellitus type II  . Hypertension  . Hyperlipidemia  . Cerebrovascular disease  . Thyroid nodule    LABS No visits with results within 3 Month(s) from this visit. Latest known visit with results is:  Orders Only on 09/09/2011  Component Date Value  . WBC 09/09/2011 8.3   . RBC 09/09/2011 5.95*  . Hemoglobin 09/09/2011 13.2   . HCT 09/09/2011 41.9   . MCV 09/09/2011 70.4*  . MCH 09/09/2011 22.2*  . MCHC 09/09/2011 31.5   . RDW 09/09/2011 17.1*  . Platelets 09/09/2011 297   . Sodium 09/09/2011 142   . Potassium 09/09/2011 3.8   . Chloride 09/09/2011 103   . CO2 09/09/2011 31   . Glucose, Bld 09/09/2011 127*  . BUN 09/09/2011 16   . Creat 09/09/2011 0.85   . Total Bilirubin  09/09/2011 0.4   . Alkaline Phosphatase 09/09/2011 60   . AST 09/09/2011 13   . ALT 09/09/2011 11   . Total Protein 09/09/2011 6.9   . Albumin 09/09/2011 4.0   . Calcium 09/09/2011 9.8   . Cholesterol 09/09/2011 180   . Triglycerides 09/09/2011 71   . HDL 09/09/2011 55   . Total CHOL/HDL Ratio 09/09/2011 3.3   . VLDL 09/09/2011 14   . LDL Cholesterol 09/09/2011 111*  . TSH 09/09/2011 0.627      Results for this Opt Visit:     Results for orders placed in visit on 09/09/11  CBC      Component Value Range   WBC 8.3  4.0 - 10.5 K/uL   RBC 5.95 (*) 3.87 - 5.11 MIL/uL   Hemoglobin 13.2  12.0 - 15.0 g/dL   HCT 14.7  82.9 - 56.2 %   MCV 70.4 (*) 78.0 - 100.0 fL   MCH 22.2 (*) 26.0 - 34.0 pg   MCHC 31.5  30.0 - 36.0 g/dL   RDW 13.0 (*) 86.5 - 78.4 %   Platelets 297  150 - 400 K/uL  COMPREHENSIVE METABOLIC PANEL      Component Value Range   Sodium 142  135 - 145 mEq/L   Potassium 3.8  3.5 - 5.3 mEq/L   Chloride 103  96 - 112 mEq/L    CO2 31  19 - 32 mEq/L   Glucose, Bld 127 (*) 70 - 99 mg/dL   BUN 16  6 - 23 mg/dL   Creat 6.96  2.95 - 2.84 mg/dL   Total Bilirubin 0.4  0.3 - 1.2 mg/dL   Alkaline Phosphatase 60  39 - 117 U/L   AST 13  0 - 37 U/L   ALT 11  0 - 35 U/L   Total Protein 6.9  6.0 - 8.3 g/dL   Albumin 4.0  3.5 - 5.2 g/dL   Calcium 9.8  8.4 - 13.2 mg/dL  LIPID PANEL      Component Value Range   Cholesterol 180  0 - 200 mg/dL   Triglycerides 71  <440 mg/dL   HDL 55  >10 mg/dL   Total CHOL/HDL Ratio 3.3     VLDL 14  0 - 40 mg/dL   LDL Cholesterol 272 (*) 0 - 99 mg/dL  TSH      Component Value Range   TSH 0.627  0.350 - 4.500 uIU/mL    EKG Orders placed in visit on 08/21/11  . EKG 12-LEAD     Prior Assessment and Plan Problem List as of 01/17/2012            Cardiology Problems   Arteriosclerotic cardiovascular disease (ASCVD)   Last Assessment & Plan Note   09/16/2011 Office Visit Addendum 09/18/2011 10:50 PM by Kathlen Brunswick, MD    Atypical chest pain that likely does not reflect myocardial ischemia.  Normal left ventricular systolic function.  Dyspnea probably related to physical deconditioning and chronic lung disease.  We will continue to pursue optimal control of cardiovascular risk factors.    Hypertension   Last Assessment & Plan Note   09/16/2011 Office Visit Signed 09/16/2011 11:16 AM by Kathlen Brunswick, MD    Inadequate control of hypertension.  Although she has experienced symptomatic bradycardia in the past, low dose beta blocker will be added to her regime.  She will probably require an additional antihypertensive medication.  She will return for a blood pressure check in  one month and to see me in 4 months.    Hyperlipidemia   Last Assessment & Plan Note   08/21/2011 Office Visit Signed 08/21/2011  4:41 PM by Kathlen Brunswick, MD    No lipid profile results are available.  Testing will be performed.    Cerebrovascular disease     Other   ANXIETY   EMPHYSEMA   BACK PAIN,  CHRONIC   Irritable bowel syndrome   Gastroesophageal reflux disease   Diabetes mellitus type II   Last Assessment & Plan Note   08/21/2011 Office Visit Signed 08/21/2011  7:45 PM by Kathlen Brunswick, MD    Near optimal hemoglobin A1c level suggests excellent control of diabetes.    Thyroid nodule       Imaging: No results found.   FRS Calculation: Score not calculated

## 2012-01-17 NOTE — Assessment & Plan Note (Addendum)
Patient remains stable with respect to coronary disease.  Stress nuclear study 4 months ago was negative.  We will continue to focus on optimal control of cardiovascular risk factors, which has been a winning strategy to date.

## 2012-01-17 NOTE — Assessment & Plan Note (Signed)
Blood pressure control is somewhat suboptimal.  Losartan 50 mg per day will be added to her medical regime.  She will continue to monitor blood pressures at home and return for a nursing visit in one month.

## 2012-02-17 ENCOUNTER — Ambulatory Visit (INDEPENDENT_AMBULATORY_CARE_PROVIDER_SITE_OTHER): Payer: PRIVATE HEALTH INSURANCE | Admitting: *Deleted

## 2012-02-17 VITALS — BP 138/70 | HR 72 | Ht 63.0 in | Wt 138.5 lb

## 2012-02-17 DIAGNOSIS — I10 Essential (primary) hypertension: Secondary | ICD-10-CM

## 2012-02-17 NOTE — Progress Notes (Signed)
PT PRESENTED TO OFFICE TODAY FOR A NV TO RE-CHECK BP: PT DID NOT BRING BLOOD PRESSURE RECORDS TO NV   VITAL SIGNS AND D/C INSTRUCTIONS AT LAST OV:  BP 146/84  Pulse 75  Ht 5\' 3"  (1.6 m)  Wt 62.143 kg (137 lb)  BMI 24.27 kg/m2  SpO2 95%   Your physician recommends that you schedule a follow-up appointment in: 6 months  Blood Pressure Check in 1 month  Your physician has requested that you regularly monitor and record your blood pressure readings at home. Please use the same machine at the same time of day to check your readings and record them to bring to your follow-up visit.  Your physician has recommended you make the following change in your medication:  1 - START Losartan 50 mg daily  2 - STOP Pravastatin  3 - START Atorvastatin 40 mg daily

## 2012-02-17 NOTE — Patient Instructions (Addendum)
We will call you with the next steps after the doctor reviews your blood pressure results from today

## 2012-02-18 NOTE — Progress Notes (Signed)
Patient ID: Sheila Myers, female   DOB: 09/03/1928, 79 y.o.   MRN: [id number]  Adequate control blood pressure. Continue current treatment.

## 2012-02-19 NOTE — Progress Notes (Signed)
.  left message to have patient return my call with pt family member to have her call back to our office

## 2012-02-21 NOTE — Progress Notes (Signed)
Spoke to pt to advise results/instructions. Pt understood.  

## 2012-04-13 ENCOUNTER — Other Ambulatory Visit (HOSPITAL_COMMUNITY): Payer: Self-pay | Admitting: Pulmonary Disease

## 2012-04-13 ENCOUNTER — Ambulatory Visit (HOSPITAL_COMMUNITY)
Admission: RE | Admit: 2012-04-13 | Discharge: 2012-04-13 | Disposition: A | Discharge status: Home / Self Care | Payer: PRIVATE HEALTH INSURANCE | Source: Ambulatory Visit | Attending: Pulmonary Disease | Admitting: Pulmonary Disease

## 2012-04-13 DIAGNOSIS — M79609 Pain in unspecified limb: Secondary | ICD-10-CM | POA: Insufficient documentation

## 2012-04-13 DIAGNOSIS — R52 Pain, unspecified: Secondary | ICD-10-CM

## 2012-04-13 DIAGNOSIS — M7989 Other specified soft tissue disorders: Secondary | ICD-10-CM | POA: Insufficient documentation

## 2012-05-24 ENCOUNTER — Other Ambulatory Visit: Payer: Self-pay | Admitting: Cardiology

## 2012-07-16 ENCOUNTER — Encounter: Payer: Self-pay | Admitting: Cardiology

## 2012-07-16 ENCOUNTER — Ambulatory Visit (INDEPENDENT_AMBULATORY_CARE_PROVIDER_SITE_OTHER): Payer: PRIVATE HEALTH INSURANCE | Admitting: Cardiology

## 2012-07-16 VITALS — BP 203/98 | HR 79 | Ht 64.0 in | Wt 129.5 lb

## 2012-07-16 DIAGNOSIS — E785 Hyperlipidemia, unspecified: Secondary | ICD-10-CM

## 2012-07-16 DIAGNOSIS — I2510 Atherosclerotic heart disease of native coronary artery without angina pectoris: Secondary | ICD-10-CM

## 2012-07-16 DIAGNOSIS — I10 Essential (primary) hypertension: Secondary | ICD-10-CM

## 2012-07-16 DIAGNOSIS — I7090 Unspecified atherosclerosis: Secondary | ICD-10-CM

## 2012-07-16 MED ORDER — AMLODIPINE BESY-BENAZEPRIL HCL 10-20 MG PO CAPS: 1.0000 | ORAL_CAPSULE | Freq: Every day | ORAL | Status: DC

## 2012-07-16 MED ORDER — TORSEMIDE 20 MG PO TABS: 20.0000 mg | ORAL_TABLET | Freq: Every day | ORAL | Status: DC

## 2012-07-16 MED ORDER — METOPROLOL SUCCINATE ER 50 MG PO TB24: 50.0000 mg | ORAL_TABLET | Freq: Every day | ORAL | Status: AC

## 2012-07-16 NOTE — Patient Instructions (Addendum)
Your physician recommends that you schedule a follow-up appointment in: WITH KL IN 3 WEEKS, YOU WILL NEED TO HAVE LABS DRAWN AFTER THIS VISIT, SLIPS WILL BE GIVEN ON THAT DAY, YOU WILL NOT NEED TO BE FASTING FOR THE CMET,CBC  Your physician has recommended you make the following change in your medication:   1) RESUME TAKING METOPROLOL XL 50MG  ONCE DAILY 2) RESUME TAKING LOTREL 10-20MG  ONCE DAILY

## 2012-07-16 NOTE — Assessment & Plan Note (Signed)
Lipid profile 10 months ago was somewhat suboptimal. Patient now treated with maximal dose of atorvastatin, which should serve as adequate therapy.

## 2012-07-16 NOTE — Assessment & Plan Note (Signed)
Blood pressure control has been lost with disruption of usual medical regime. Treatment with Lotrel will be restarted as well most recent dose of metoprolol. She will likely require adjustment of this therapy. We have asked her to monitor home blood pressure and return for reassessment in 3 weeks.

## 2012-07-16 NOTE — Progress Notes (Deleted)
Name: Sheila Myers    DOB: 03-Aug-1928  Age: 79 y.o.  MR#: [id number]       PCP:  Fredirick Maudlin, MD      Insurance: Payor: Advertising copywriter MEDICARE / Plan: Ollen Gross / Product Type: *No Product type* /   CC:   No chief complaint on file.  Bottles, first BP reading was 191/94 re-checked times 2 and increased both times VS Filed Vitals:   07/16/12 1313  BP: 203/98  Pulse: 79  Height: 5\' 4"  (1.626 m)  Weight: 129 lb 8 oz (58.741 kg)    Weights Current Weight  07/16/12 129 lb 8 oz (58.741 kg)  02/17/12 138 lb 8 oz (62.823 kg)  01/17/12 137 lb (62.143 kg)    Blood Pressure  BP Readings from Last 3 Encounters:  07/16/12 203/98  02/17/12 138/70  01/17/12 146/84     Admit date:  (Not on file) Last encounter with RMR:  05/24/2012   Allergy Peanut-containing drug products; Catapres; Codeine; and Morphine  Current Outpatient Prescriptions  Medication Sig Dispense Refill  . albuterol (PROVENTIL) (2.5 MG/3ML) 0.083% nebulizer solution Take 2.5 mg by nebulization 4 (four) times daily.      Marland Kitchen ALPRAZolam (XANAX) 0.5 MG tablet Take 0.5 mg by mouth 3 (three) times daily as needed.      Marland Kitchen amitriptyline (ELAVIL) 10 MG tablet Take 10 mg by mouth at bedtime.      Marland Kitchen atorvastatin (LIPITOR) 80 MG tablet Take 1 tablet (80 mg total) by mouth daily.  30 tablet  6  . bimatoprost (LUMIGAN) 0.03 % ophthalmic drops Place 1 drop into both eyes at bedtime.        . cloNIDine (CATAPRES) 0.2 MG tablet       . esomeprazole (NEXIUM) 40 MG capsule Take 40 mg by mouth 2 (two) times daily with a meal.      . hydrALAZINE (APRESOLINE) 25 MG tablet Take 25 mg by mouth daily.       Marland Kitchen HYDROcodone-acetaminophen (NORCO) 5-325 MG per tablet Take 1 tablet by mouth 4 (four) times daily.       . metFORMIN (GLUCOPHAGE) 500 MG tablet Take 500 mg by mouth daily with breakfast.       No current facility-administered medications for this visit.    Discontinued Meds:    Medications Discontinued During This Encounter   Medication Reason  . amLODipine-benazepril (LOTREL) 10-20 MG per capsule Error  . metoprolol succinate (TOPROL-XL) 50 MG 24 hr tablet Error  . torsemide (DEMADEX) 20 MG tablet Error  . losartan (COZAAR) 50 MG tablet Error    Patient Active Problem List   Diagnosis Date Noted  . Cerebrovascular disease 09/28/2011  . Thyroid nodule 09/28/2011  . Irritable bowel syndrome   . Gastroesophageal reflux disease   . Arteriosclerotic cardiovascular disease (ASCVD)   . Diabetes mellitus type II   . Hypertension   . Hyperlipidemia   . Emphysema 05/10/2008  . BACK PAIN, CHRONIC 05/10/2008    LABS    Component Value Date/Time   NA 142 09/09/2011 1110   NA 138 11/30/2009 1155   NA 138 03/31/2008 1648   K 3.8 09/09/2011 1110   K 3.3* 11/30/2009 1155   K 3.5 03/31/2008 1648   CL 103 09/09/2011 1110   CL 101 11/30/2009 1155   CL 102 03/31/2008 1648   CO2 31 09/09/2011 1110   CO2 30 11/30/2009 1155   CO2 31 03/31/2008 1648   GLUCOSE 127* 09/09/2011 1110   GLUCOSE  120* 11/30/2009 1155   GLUCOSE 110* 03/31/2008 1648   BUN 16 09/09/2011 1110   BUN 10 11/30/2009 1155   BUN 9 03/31/2008 1648   CREATININE 0.85 09/09/2011 1110   CREATININE 0.72 11/30/2009 1155   CREATININE 0.80 03/31/2008 1648   CREATININE 0.77 08/10/2007 0510   CALCIUM 9.8 09/09/2011 1110   CALCIUM 9.6 11/30/2009 1155   CALCIUM 9.4 03/31/2008 1648   GFRNONAA >60 11/30/2009 1155   GFRNONAA >60 08/10/2007 0510   GFRNONAA >60 08/07/2007 1600   GFRAA  Value: >60        The eGFR has been calculated using the MDRD equation. This calculation has not been validated in all clinical situations. eGFR's persistently <60 mL/min signify possible Chronic Kidney Disease. 11/30/2009 1155   GFRAA  Value: >60        The eGFR has been calculated using the MDRD equation. This calculation has not been validated in all clinical 08/10/2007 0510   GFRAA  Value: >60        The eGFR has been calculated using the MDRD equation. This calculation has not been validated in all clinical  08/07/2007 1600   CMP     Component Value Date/Time   NA 142 09/09/2011 1110   K 3.8 09/09/2011 1110   CL 103 09/09/2011 1110   CO2 31 09/09/2011 1110   GLUCOSE 127* 09/09/2011 1110   BUN 16 09/09/2011 1110   CREATININE 0.85 09/09/2011 1110   CREATININE 0.72 11/30/2009 1155   CALCIUM 9.8 09/09/2011 1110   PROT 6.9 09/09/2011 1110   ALBUMIN 4.0 09/09/2011 1110   AST 13 09/09/2011 1110   ALT 11 09/09/2011 1110   ALKPHOS 60 09/09/2011 1110   BILITOT 0.4 09/09/2011 1110   GFRNONAA >60 11/30/2009 1155   GFRAA  Value: >60        The eGFR has been calculated using the MDRD equation. This calculation has not been validated in all clinical situations. eGFR's persistently <60 mL/min signify possible Chronic Kidney Disease. 11/30/2009 1155       Component Value Date/Time   WBC 8.3 09/09/2011 1110   WBC 15.6* 08/09/2007 0615   WBC 15.1* 08/07/2007 1600   HGB 13.2 09/09/2011 1110   HGB 14.3 11/30/2009 1155   HGB 12.4 08/09/2007 0615   HCT 41.9 09/09/2011 1110   HCT 44.7 11/30/2009 1155   HCT 38.7 08/09/2007 0615   MCV 70.4* 09/09/2011 1110   MCV 70.9* 08/09/2007 0615   MCV 70.8* 08/07/2007 1600    Lipid Panel     Component Value Date/Time   CHOL 180 09/09/2011 1110   TRIG 71 09/09/2011 1110   HDL 55 09/09/2011 1110   CHOLHDL 3.3 09/09/2011 1110   VLDL 14 09/09/2011 1110   LDLCALC 111* 09/09/2011 1110    ABG    Component Value Date/Time   PHART 7.470* 08/07/2007 1610   PCO2ART 37.4 08/07/2007 1610   PO2ART 87.1 08/07/2007 1610   HCO3 26.9* 08/07/2007 1610   TCO2 23.4 08/07/2007 1610   O2SAT 96.9 08/07/2007 1610     Lab Results  Component Value Date   TSH 0.627 09/09/2011   BNP (last 3 results) No results found for this basename: PROBNP,  in the last 8760 hours Cardiac Panel (last 3 results) No results found for this basename: CKTOTAL, CKMB, TROPONINI, RELINDX,  in the last 72 hours  Iron/TIBC/Ferritin No results found for this basename: iron, tibc, ferritin     EKG Orders placed in visit on 07/16/12   .  EKG 12-LEAD     Prior Assessment and Plan Problem List as of 07/16/2012   Emphysema   BACK PAIN, CHRONIC   Irritable bowel syndrome   Gastroesophageal reflux disease   Arteriosclerotic cardiovascular disease (ASCVD)   Last Assessment & Plan   01/17/2012 Office Visit Edited 01/17/2012 11:43 AM by Kathlen Brunswick, MD     Patient remains stable with respect to coronary disease.  Stress nuclear study 4 months ago was negative.  We will continue to focus on optimal control of cardiovascular risk factors, which has been a winning strategy to date.    Diabetes mellitus type II   Last Assessment & Plan   08/21/2011 Office Visit Written 08/21/2011  7:45 PM by Kathlen Brunswick, MD     Near optimal hemoglobin A1c level suggests excellent control of diabetes.    Hypertension   Last Assessment & Plan   01/17/2012 Office Visit Written 01/17/2012 11:46 AM by Kathlen Brunswick, MD     Blood pressure control is somewhat suboptimal.  Losartan 50 mg per day will be added to her medical regime.  She will continue to monitor blood pressures at home and return for a nursing visit in one month.    Hyperlipidemia   Last Assessment & Plan   01/17/2012 Office Visit Written 01/17/2012 11:45 AM by Kathlen Brunswick, MD     Lipid profile is suboptimal on high-dose pravastatin.  We will determine if atorvastatin can be obtained at a reasonable cost and order a dose of 40 mg per day.    Cerebrovascular disease   Thyroid nodule       Imaging: No results found.

## 2012-07-16 NOTE — Progress Notes (Signed)
Patient ID: Sheila Myers, female   DOB: 01/18/1929, 79 y.o.   MRN: [id number]  HPI: Scheduled return visit for management of cardiovascular risk factors in the setting of known coronary and cerebrovascular disease. Patient is not entirely clear with respect what medication she is taking. All of her antihypertensive medications have disappeared. Notes that these were discontinued by Dr. [name] who substituted hydralazine. Case discussed with Dr. [name].  He requested that she discontinue clonidine to which she was experiencing adverse effects (dry mouth) and start hydralazine. Patient still has Lotrel at home, but does not believe that she has metoprolol. Dr. [name] did discontinue her ARB on the basis of concomitant treatment with an ACE inhibitor.  Current Outpatient Prescriptions  Medication Sig Dispense Refill  . albuterol (PROVENTIL) (2.5 MG/3ML) 0.083% nebulizer solution Take 2.5 mg by nebulization 4 (four) times daily.      Marland Kitchen ALPRAZolam (XANAX) 0.5 MG tablet Take 0.5 mg by mouth 3 (three) times daily as needed.      Marland Kitchen amitriptyline (ELAVIL) 10 MG tablet Take 10 mg by mouth at bedtime.      Marland Kitchen amLODipine-benazepril (LOTREL) 10-20 MG per capsule Take 1 capsule by mouth daily.  90 capsule  1  . atorvastatin (LIPITOR) 80 MG tablet Take 1 tablet (80 mg total) by mouth daily.  30 tablet  6  . bimatoprost (LUMIGAN) 0.03 % ophthalmic drops Place 1 drop into both eyes at bedtime.        . cloNIDine (CATAPRES) 0.2 MG tablet       . esomeprazole (NEXIUM) 40 MG capsule Take 40 mg by mouth 2 (two) times daily with a meal.      . hydrALAZINE (APRESOLINE) 25 MG tablet Take 25 mg by mouth daily.       Marland Kitchen HYDROcodone-acetaminophen (NORCO) 5-325 MG per tablet Take 1 tablet by mouth 4 (four) times daily.       . metFORMIN (GLUCOPHAGE) 500 MG tablet Take 500 mg by mouth daily with breakfast.      . metoprolol succinate (TOPROL-XL) 50 MG 24 hr tablet Take 1 tablet (50 mg total) by mouth daily.  90 tablet  1   . torsemide (DEMADEX) 20 MG tablet Take 1 tablet (20 mg total) by mouth daily.  90 tablet  1   No current facility-administered medications for this visit.   Allergies  Allergen Reactions  . Peanut-Containing Drug Products Anaphylaxis  . Catapres (Clonidine Hcl) Other (See Comments)    Dry mouth  . Codeine Other (See Comments)    "I just freak out"  . Morphine Other (See Comments)    "I just freak out"  Past medical history, social history, and family history reviewed and updated.  ROS: Denies peripheral edema (resolved since medications were changed), palpitations, lightheadedness, syncope, chest pain or dyspnea. All other systems reviewed and are negative.  PHYSICAL EXAM: BP 203/98  Pulse 79  Ht 5\' 4"  (1.626 m)  Wt 58.741 kg (129 lb 8 oz)  BMI 22.22 kg/m2;  Body mass index is 22.22 kg/(m^2). General-Well developed; no acute distress Body habitus-proportionate weight and height Neck-No JVD; no carotid bruits Lungs-clear lung fields; resonant to percussion Cardiovascular-normal PMI; normal S1 and S2; modest systolic ejection murmur Abdomen-normal bowel sounds; soft and non-tender without masses or organomegaly Musculoskeletal-No deformities, no cyanosis or clubbing Neurologic-Normal cranial nerves; symmetric strength and tone Skin-Warm, no significant lesions Extremities-distal pulses intact; no edema  Bulger Bing, MD 07/16/2012  2:23 PM  ASSESSMENT AND PLAN

## 2012-07-16 NOTE — Assessment & Plan Note (Signed)
No recent symptoms to suggest progression of coronary disease or myocardial ischemia. She basically requires improved management of risk factors, most notably hypertension.

## 2012-08-07 ENCOUNTER — Ambulatory Visit (INDEPENDENT_AMBULATORY_CARE_PROVIDER_SITE_OTHER): Payer: PRIVATE HEALTH INSURANCE | Admitting: Adult Health

## 2012-08-07 ENCOUNTER — Encounter: Payer: Self-pay | Admitting: Adult Health

## 2012-08-07 VITALS — BP 127/70 | HR 62 | Ht 63.0 in | Wt 131.0 lb

## 2012-08-07 DIAGNOSIS — I7090 Unspecified atherosclerosis: Secondary | ICD-10-CM

## 2012-08-07 DIAGNOSIS — I10 Essential (primary) hypertension: Secondary | ICD-10-CM

## 2012-08-07 DIAGNOSIS — I2510 Atherosclerotic heart disease of native coronary artery without angina pectoris: Secondary | ICD-10-CM

## 2012-08-07 DIAGNOSIS — E785 Hyperlipidemia, unspecified: Secondary | ICD-10-CM

## 2012-08-07 NOTE — Assessment & Plan Note (Signed)
She is without cardiac complaint. Remains active. Will continue risk management and current regimen. She will be seen in 6 months unless symptomatic.

## 2012-08-07 NOTE — Progress Notes (Signed)
HPI: Sheila Myers is a 79 year old patient of Dr. Dietrich Pates we are following for ongoing management and assessment of CAD, and cerebrovascular disease. She also has a history of hypertension. The patient on last visit had a blood pressure of 203/98 and had had difficulty with medical compliance due to multiple changes. Lotrel was restarted, along with metoprolol. She is here for ongoing followup of blood pressure control.    She comes today without complaint. She is medically complaint and is in good spirits.   Allergies  Allergen Reactions  . Peanut-Containing Drug Products Anaphylaxis  . Catapres (Clonidine Hcl) Other (See Comments)    Dry mouth  . Codeine Other (See Comments)    "I just freak out"  . Morphine Other (See Comments)    "I just freak out"    Current Outpatient Prescriptions  Medication Sig Dispense Refill  . albuterol (PROVENTIL) (2.5 MG/3ML) 0.083% nebulizer solution Take 2.5 mg by nebulization 4 (four) times daily.      Marland Kitchen ALPRAZolam (XANAX) 0.5 MG tablet Take 0.5 mg by mouth 3 (three) times daily as needed.      Marland Kitchen amitriptyline (ELAVIL) 10 MG tablet Take 10 mg by mouth at bedtime.      Marland Kitchen amLODipine-benazepril (LOTREL) 10-20 MG per capsule Take 1 capsule by mouth daily.  90 capsule  1  . atorvastatin (LIPITOR) 80 MG tablet Take 1 tablet (80 mg total) by mouth daily.  30 tablet  6  . bimatoprost (LUMIGAN) 0.03 % ophthalmic drops Place 1 drop into both eyes at bedtime.        . cloNIDine (CATAPRES) 0.2 MG tablet       . esomeprazole (NEXIUM) 40 MG capsule Take 40 mg by mouth 2 (two) times daily with a meal.      . hydrALAZINE (APRESOLINE) 25 MG tablet Take 25 mg by mouth daily.       Marland Kitchen HYDROcodone-acetaminophen (NORCO) 5-325 MG per tablet Take 1 tablet by mouth 4 (four) times daily.       . metFORMIN (GLUCOPHAGE) 500 MG tablet Take 500 mg by mouth daily with breakfast.      . metoprolol succinate (TOPROL-XL) 50 MG 24 hr tablet Take 1 tablet (50 mg total) by mouth daily.   90 tablet  1  . torsemide (DEMADEX) 20 MG tablet Take 1 tablet (20 mg total) by mouth daily.  90 tablet  1   No current facility-administered medications for this visit.    Past Medical History  Diagnosis Date  . Irritable bowel syndrome     constipation alternating with diarrhea  . Gastroesophageal reflux disease     Diverticulosis, hemorrhoids with rectal bleeding,  . Hyperlipidemia   . Glaucoma   . ASCVD (arteriosclerotic cardiovascular disease)     H/o PCI  . Diabetes mellitus type II   . Hypertension   . Degenerative joint disease     Right shoulder pain-mechanism unclear  . Depression   . Chronic obstructive pulmonary disease     Past Surgical History  Procedure Laterality Date  . Abdominal hysterectomy    . Appendectomy    . Cholecystectomy    . Colonoscopy  2007    Negative screening study except for diverticulosis    ROS: Review of systems complete and found to be negative unless listed above  PHYSICAL EXAM BP 127/70  Pulse 62  Ht 5\' 3"  (1.6 m)  Wt 131 lb (59.421 kg)  BMI 23.21 kg/m2  General: Well developed, well nourished, thin in  no acute distress Head: Eyes PERRLA, No xanthomas.   Normal cephalic and atramatic  Lungs: Clear bilaterally to auscultation and percussion. Heart: HRRR S1 S2, without MRG.  Pulses are 2+ & equal.            No carotid bruit. No JVD.  No abdominal bruits. No femoral bruits. Abdomen: Bowel sounds are positive, abdomen soft and non-tender without masses or                  Hernia's noted. Msk:  Back normal, normal gait. Normal strength and tone for age. Extremities: No clubbing, cyanosis or edema.  DP +1 Neuro: Alert and oriented X 3. Psych:  Good affect, responds appropriately   ASSESSMENT AND PLAN

## 2012-08-07 NOTE — Assessment & Plan Note (Signed)
Fasting lipids and LFT's today. She has not eaten. She will continue statin treatment. Labs to go to Dr. [name].

## 2012-08-07 NOTE — Progress Notes (Deleted)
Name: Sheila Myers    DOB: 1928-04-05  Age: 79 y.o.  MR#: [id number]       PCP:  Fredirick Maudlin, MD      Insurance: Payor: Advertising copywriter MEDICARE / Plan: Ollen Gross / Product Type: *No Product type* /   CC:    Chief Complaint  Patient presents with  . Coronary Artery Disease  . Hypertension    VS Filed Vitals:   08/07/12 1332  BP: 127/70  Pulse: 62  Height: 5\' 3"  (1.6 m)  Weight: 131 lb (59.421 kg)    Weights Current Weight  08/07/12 131 lb (59.421 kg)  07/16/12 129 lb 8 oz (58.741 kg)  02/17/12 138 lb 8 oz (62.823 kg)    Blood Pressure  BP Readings from Last 3 Encounters:  08/07/12 127/70  07/16/12 203/98  02/17/12 138/70     Admit date:  (Not on file) Last encounter with RMR:  Visit date not found   Allergy Peanut-containing drug products; Catapres; Codeine; and Morphine  Current Outpatient Prescriptions  Medication Sig Dispense Refill  . albuterol (PROVENTIL) (2.5 MG/3ML) 0.083% nebulizer solution Take 2.5 mg by nebulization 4 (four) times daily.      Marland Kitchen ALPRAZolam (XANAX) 0.5 MG tablet Take 0.5 mg by mouth 3 (three) times daily as needed.      Marland Kitchen amitriptyline (ELAVIL) 10 MG tablet Take 10 mg by mouth at bedtime.      Marland Kitchen amLODipine-benazepril (LOTREL) 10-20 MG per capsule Take 1 capsule by mouth daily.  90 capsule  1  . atorvastatin (LIPITOR) 80 MG tablet Take 1 tablet (80 mg total) by mouth daily.  30 tablet  6  . bimatoprost (LUMIGAN) 0.03 % ophthalmic drops Place 1 drop into both eyes at bedtime.        . cloNIDine (CATAPRES) 0.2 MG tablet       . esomeprazole (NEXIUM) 40 MG capsule Take 40 mg by mouth 2 (two) times daily with a meal.      . hydrALAZINE (APRESOLINE) 25 MG tablet Take 25 mg by mouth daily.       Marland Kitchen HYDROcodone-acetaminophen (NORCO) 5-325 MG per tablet Take 1 tablet by mouth 4 (four) times daily.       . metFORMIN (GLUCOPHAGE) 500 MG tablet Take 500 mg by mouth daily with breakfast.      . metoprolol succinate (TOPROL-XL) 50 MG 24 hr tablet  Take 1 tablet (50 mg total) by mouth daily.  90 tablet  1  . torsemide (DEMADEX) 20 MG tablet Take 1 tablet (20 mg total) by mouth daily.  90 tablet  1   No current facility-administered medications for this visit.    Discontinued Meds:   There are no discontinued medications.  Patient Active Problem List   Diagnosis Date Noted  . Cerebrovascular disease 09/28/2011  . Thyroid nodule 09/28/2011  . Irritable bowel syndrome   . Gastroesophageal reflux disease   . Arteriosclerotic cardiovascular disease (ASCVD)   . Diabetes mellitus type II   . Hypertension   . Hyperlipidemia   . Emphysema 05/10/2008  . BACK PAIN, CHRONIC 05/10/2008    LABS    Component Value Date/Time   NA 142 09/09/2011 1110   NA 138 11/30/2009 1155   NA 138 03/31/2008 1648   K 3.8 09/09/2011 1110   K 3.3* 11/30/2009 1155   K 3.5 03/31/2008 1648   CL 103 09/09/2011 1110   CL 101 11/30/2009 1155   CL 102 03/31/2008 1648   CO2 31 09/09/2011  1110   CO2 30 11/30/2009 1155   CO2 31 03/31/2008 1648   GLUCOSE 127* 09/09/2011 1110   GLUCOSE 120* 11/30/2009 1155   GLUCOSE 110* 03/31/2008 1648   BUN 16 09/09/2011 1110   BUN 10 11/30/2009 1155   BUN 9 03/31/2008 1648   CREATININE 0.85 09/09/2011 1110   CREATININE 0.72 11/30/2009 1155   CREATININE 0.80 03/31/2008 1648   CREATININE 0.77 08/10/2007 0510   CALCIUM 9.8 09/09/2011 1110   CALCIUM 9.6 11/30/2009 1155   CALCIUM 9.4 03/31/2008 1648   GFRNONAA >60 11/30/2009 1155   GFRNONAA >60 08/10/2007 0510   GFRNONAA >60 08/07/2007 1600   GFRAA  Value: >60        The eGFR has been calculated using the MDRD equation. This calculation has not been validated in all clinical situations. eGFR's persistently <60 mL/min signify possible Chronic Kidney Disease. 11/30/2009 1155   GFRAA  Value: >60        The eGFR has been calculated using the MDRD equation. This calculation has not been validated in all clinical 08/10/2007 0510   GFRAA  Value: >60        The eGFR has been calculated using the MDRD equation.  This calculation has not been validated in all clinical 08/07/2007 1600   CMP     Component Value Date/Time   NA 142 09/09/2011 1110   K 3.8 09/09/2011 1110   CL 103 09/09/2011 1110   CO2 31 09/09/2011 1110   GLUCOSE 127* 09/09/2011 1110   BUN 16 09/09/2011 1110   CREATININE 0.85 09/09/2011 1110   CREATININE 0.72 11/30/2009 1155   CALCIUM 9.8 09/09/2011 1110   PROT 6.9 09/09/2011 1110   ALBUMIN 4.0 09/09/2011 1110   AST 13 09/09/2011 1110   ALT 11 09/09/2011 1110   ALKPHOS 60 09/09/2011 1110   BILITOT 0.4 09/09/2011 1110   GFRNONAA >60 11/30/2009 1155   GFRAA  Value: >60        The eGFR has been calculated using the MDRD equation. This calculation has not been validated in all clinical situations. eGFR's persistently <60 mL/min signify possible Chronic Kidney Disease. 11/30/2009 1155       Component Value Date/Time   WBC 8.3 09/09/2011 1110   WBC 15.6* 08/09/2007 0615   WBC 15.1* 08/07/2007 1600   HGB 13.2 09/09/2011 1110   HGB 14.3 11/30/2009 1155   HGB 12.4 08/09/2007 0615   HCT 41.9 09/09/2011 1110   HCT 44.7 11/30/2009 1155   HCT 38.7 08/09/2007 0615   MCV 70.4* 09/09/2011 1110   MCV 70.9* 08/09/2007 0615   MCV 70.8* 08/07/2007 1600    Lipid Panel     Component Value Date/Time   CHOL 180 09/09/2011 1110   TRIG 71 09/09/2011 1110   HDL 55 09/09/2011 1110   CHOLHDL 3.3 09/09/2011 1110   VLDL 14 09/09/2011 1110   LDLCALC 111* 09/09/2011 1110    ABG    Component Value Date/Time   PHART 7.470* 08/07/2007 1610   PCO2ART 37.4 08/07/2007 1610   PO2ART 87.1 08/07/2007 1610   HCO3 26.9* 08/07/2007 1610   TCO2 23.4 08/07/2007 1610   O2SAT 96.9 08/07/2007 1610     Lab Results  Component Value Date   TSH 0.627 09/09/2011   BNP (last 3 results) No results found for this basename: PROBNP,  in the last 8760 hours Cardiac Panel (last 3 results) No results found for this basename: CKTOTAL, CKMB, TROPONINI, RELINDX,  in the last 72 hours  Iron/TIBC/Ferritin  No results found for this basename: iron, tibc,  ferritin     EKG Orders placed in visit on 07/16/12  . EKG 12-LEAD     Prior Assessment and Plan Problem List as of 08/07/2012     Cardiovascular and Mediastinum   Arteriosclerotic cardiovascular disease (ASCVD)   Last Assessment & Plan   07/16/2012 Office Visit Written 07/16/2012  8:44 PM by Kathlen Brunswick, MD     No recent symptoms to suggest progression of coronary disease or myocardial ischemia. She basically requires improved management of risk factors, most notably hypertension.    Hypertension   Last Assessment & Plan   07/16/2012 Office Visit Written 07/16/2012  8:46 PM by Kathlen Brunswick, MD     Blood pressure control has been lost with disruption of usual medical regime. Treatment with Lotrel will be restarted as well most recent dose of metoprolol. She will likely require adjustment of this therapy. We have asked her to monitor home blood pressure and return for reassessment in 3 weeks.    Cerebrovascular disease     Respiratory   Emphysema     Digestive   Irritable bowel syndrome   Gastroesophageal reflux disease     Endocrine   Diabetes mellitus type II   Last Assessment & Plan   08/21/2011 Office Visit Written 08/21/2011  7:45 PM by Kathlen Brunswick, MD     Near optimal hemoglobin A1c level suggests excellent control of diabetes.    Thyroid nodule     Other   BACK PAIN, CHRONIC   Hyperlipidemia   Last Assessment & Plan   07/16/2012 Office Visit Written 07/16/2012  8:45 PM by Kathlen Brunswick, MD     Lipid profile 10 months ago was somewhat suboptimal. Patient now treated with maximal dose of atorvastatin, which should serve as adequate therapy.        Imaging: No results found.

## 2012-08-07 NOTE — Patient Instructions (Addendum)
Your physician recommends that you schedule a follow-up appointment in: 6 MONTHS. You will receive a reminder letter in the mail in about 4 months reminding you to call and schedule your appointment. If you don't receive this letter, please contact our office.  Your physician recommends that you return for lab work in: TODAY

## 2012-08-07 NOTE — Assessment & Plan Note (Addendum)
Excellent control of BP with medication adjustments. She will continue lotrel and hydralazine as directed, along with clonidine. BMET will be drawn today

## 2012-08-08 LAB — HEPATIC FUNCTION PANEL
AST: 12 U/L (ref 0–37)
Albumin: 3.7 g/dL (ref 3.5–5.2)
Bilirubin, Direct: 0.2 mg/dL (ref 0.0–0.3)
Total Bilirubin: 0.9 mg/dL (ref 0.3–1.2)

## 2012-08-08 LAB — LIPID PANEL
HDL: 38 mg/dL — ABNORMAL LOW (ref >39)
Total CHOL/HDL Ratio: 3.9 Ratio
VLDL: 21 mg/dL (ref 0–40)

## 2012-08-08 LAB — BASIC METABOLIC PANEL
CO2: 28 mEq/L (ref 19–32)
Chloride: 97 mEq/L (ref 96–112)
Glucose, Bld: 133 mg/dL — ABNORMAL HIGH (ref 70–99)
Potassium: 3.2 mEq/L — ABNORMAL LOW (ref 3.5–5.3)
Sodium: 143 mEq/L (ref 135–145)

## 2012-08-10 ENCOUNTER — Encounter: Payer: Self-pay | Admitting: *Deleted

## 2012-08-10 MED ORDER — POTASSIUM CHLORIDE CRYS ER 20 MEQ PO TBCR: 20.0000 meq | EXTENDED_RELEASE_TABLET | Freq: Every day | ORAL | Status: DC

## 2012-08-10 NOTE — Addendum Note (Signed)
Addended by: Thompson Grayer on: 08/10/2012 08:55 AM   Modules accepted: Orders

## 2012-08-18 LAB — BASIC METABOLIC PANEL
CO2: 31 mEq/L (ref 19–32)
Calcium: 9.3 mg/dL (ref 8.4–10.5)
Glucose, Bld: 174 mg/dL — ABNORMAL HIGH (ref 70–99)
Potassium: 3.8 mEq/L (ref 3.5–5.3)
Sodium: 143 mEq/L (ref 135–145)

## 2012-08-20 ENCOUNTER — Encounter: Payer: Self-pay | Admitting: *Deleted

## 2012-08-20 ENCOUNTER — Other Ambulatory Visit: Payer: Self-pay | Admitting: *Deleted

## 2012-08-20 DIAGNOSIS — I10 Essential (primary) hypertension: Secondary | ICD-10-CM

## 2013-01-12 ENCOUNTER — Other Ambulatory Visit: Payer: Self-pay | Admitting: Cardiology

## 2013-02-07 ENCOUNTER — Other Ambulatory Visit: Payer: Self-pay | Admitting: Adult Health

## 2013-05-27 ENCOUNTER — Ambulatory Visit (INDEPENDENT_AMBULATORY_CARE_PROVIDER_SITE_OTHER): Payer: PRIVATE HEALTH INSURANCE | Admitting: Otolaryngology

## 2013-05-27 DIAGNOSIS — H903 Sensorineural hearing loss, bilateral: Secondary | ICD-10-CM

## 2013-05-27 DIAGNOSIS — H608X9 Other otitis externa, unspecified ear: Secondary | ICD-10-CM

## 2013-05-27 DIAGNOSIS — H6060 Unspecified chronic otitis externa, unspecified ear: Secondary | ICD-10-CM

## 2013-08-12 ENCOUNTER — Encounter (HOSPITAL_COMMUNITY): Payer: Self-pay | Admitting: Emergency Medicine

## 2013-08-12 ENCOUNTER — Emergency Department (HOSPITAL_COMMUNITY)
Admission: EM | Admit: 2013-08-12 | Discharge: 2013-08-12 | Disposition: A | Discharge status: Home / Self Care | Payer: PRIVATE HEALTH INSURANCE | Attending: Emergency Medicine | Admitting: Emergency Medicine

## 2013-08-12 ENCOUNTER — Emergency Department (HOSPITAL_COMMUNITY): Payer: PRIVATE HEALTH INSURANCE

## 2013-08-12 DIAGNOSIS — K219 Gastro-esophageal reflux disease without esophagitis: Secondary | ICD-10-CM | POA: Diagnosis not present

## 2013-08-12 DIAGNOSIS — L989 Disorder of the skin and subcutaneous tissue, unspecified: Secondary | ICD-10-CM

## 2013-08-12 DIAGNOSIS — J441 Chronic obstructive pulmonary disease with (acute) exacerbation: Secondary | ICD-10-CM | POA: Insufficient documentation

## 2013-08-12 DIAGNOSIS — Z79899 Other long term (current) drug therapy: Secondary | ICD-10-CM | POA: Diagnosis not present

## 2013-08-12 DIAGNOSIS — Z792 Long term (current) use of antibiotics: Secondary | ICD-10-CM | POA: Diagnosis not present

## 2013-08-12 DIAGNOSIS — E119 Type 2 diabetes mellitus without complications: Secondary | ICD-10-CM | POA: Diagnosis not present

## 2013-08-12 DIAGNOSIS — H4089 Other specified glaucoma: Secondary | ICD-10-CM | POA: Diagnosis not present

## 2013-08-12 DIAGNOSIS — I2510 Atherosclerotic heart disease of native coronary artery without angina pectoris: Secondary | ICD-10-CM | POA: Insufficient documentation

## 2013-08-12 DIAGNOSIS — I10 Essential (primary) hypertension: Secondary | ICD-10-CM | POA: Insufficient documentation

## 2013-08-12 DIAGNOSIS — M19019 Primary osteoarthritis, unspecified shoulder: Secondary | ICD-10-CM | POA: Diagnosis not present

## 2013-08-12 DIAGNOSIS — F3289 Other specified depressive episodes: Secondary | ICD-10-CM | POA: Diagnosis not present

## 2013-08-12 DIAGNOSIS — R079 Chest pain, unspecified: Secondary | ICD-10-CM | POA: Diagnosis present

## 2013-08-12 DIAGNOSIS — F329 Major depressive disorder, single episode, unspecified: Secondary | ICD-10-CM | POA: Insufficient documentation

## 2013-08-12 LAB — CBC WITH DIFFERENTIAL/PLATELET
BASOS PCT: 0 % (ref 0–1)
Basophils Absolute: 0 10*3/uL (ref 0.0–0.1)
Eosinophils Absolute: 0.1 10*3/uL (ref 0.0–0.7)
Eosinophils Relative: 2 % (ref 0–5)
HCT: 38.4 % (ref 36.0–46.0)
Hemoglobin: 12.6 g/dL (ref 12.0–15.0)
Lymphocytes Relative: 29 % (ref 12–46)
Lymphs Abs: 1.8 10*3/uL (ref 0.7–4.0)
MCH: 22.8 pg — ABNORMAL LOW (ref 26.0–34.0)
MCHC: 32.8 g/dL (ref 30.0–36.0)
MCV: 69.6 fL — ABNORMAL LOW (ref 78.0–100.0)
MONO ABS: 0.3 10*3/uL (ref 0.1–1.0)
Monocytes Relative: 5 % (ref 3–12)
NEUTROS ABS: 4.1 10*3/uL (ref 1.7–7.7)
Neutrophils Relative %: 65 % (ref 43–77)
PLATELETS: 209 10*3/uL (ref 150–400)
RBC: 5.52 MIL/uL — ABNORMAL HIGH (ref 3.87–5.11)
RDW: 15.4 % (ref 11.5–15.5)
WBC: 6.4 10*3/uL (ref 4.0–10.5)

## 2013-08-12 LAB — BASIC METABOLIC PANEL
ANION GAP: 11 (ref 5–15)
BUN: 9 mg/dL (ref 6–23)
CALCIUM: 9.1 mg/dL (ref 8.4–10.5)
CO2: 30 mEq/L (ref 19–32)
CREATININE: 0.75 mg/dL (ref 0.50–1.10)
Chloride: 101 mEq/L (ref 96–112)
GFR calc Af Amer: 89 mL/min — ABNORMAL LOW (ref >90)
GFR calc non Af Amer: 77 mL/min — ABNORMAL LOW (ref >90)
Glucose, Bld: 136 mg/dL — ABNORMAL HIGH (ref 70–99)
Potassium: 2.9 mEq/L — CL (ref 3.7–5.3)
SODIUM: 142 meq/L (ref 137–147)

## 2013-08-12 LAB — TROPONIN I

## 2013-08-12 MED ORDER — DOXYCYCLINE HYCLATE 100 MG PO CAPS: 100.0000 mg | ORAL_CAPSULE | Freq: Two times a day (BID) | ORAL | Status: DC

## 2013-08-12 MED ORDER — PREDNISONE 20 MG PO TABS: ORAL_TABLET | ORAL | Status: DC

## 2013-08-12 MED ORDER — POTASSIUM CHLORIDE CRYS ER 20 MEQ PO TBCR
40.0000 meq | EXTENDED_RELEASE_TABLET | Freq: Once | ORAL | Status: AC
  Administered 2013-08-12: 40 meq via ORAL
  Filled 2013-08-12: qty 2

## 2013-08-12 MED ORDER — PREDNISONE 50 MG PO TABS
60.0000 mg | ORAL_TABLET | Freq: Once | ORAL | Status: AC
  Administered 2013-08-12: 60 mg via ORAL
  Filled 2013-08-12 (×2): qty 1

## 2013-08-12 NOTE — ED Notes (Signed)
Pt c/o chest tightness, nonproductive cough, and low grade fever for the past few days.

## 2013-08-12 NOTE — ED Notes (Signed)
Pt states she has had chest discomfort all week. Indicates pain in upper left and right chest. States pai is worse when coughing

## 2013-08-12 NOTE — ED Notes (Signed)
Pt told sitter he was going home. States he can here on his own and he is going to leave. EDP aware and in to talk with pt

## 2013-08-12 NOTE — ED Provider Notes (Signed)
CSN: [id number]     Arrival date & time 08/12/13  1128 History   First MD Initiated Contact with Patient 08/12/13 1207     Chief Complaint  Patient presents with  . Chest Pain     (Consider location/radiation/quality/duration/timing/severity/associated sxs/prior Treatment) HPI 80 year old female with history of COPD and coronary artery disease presents with a few days of a cough, intermittent wheezing and shortness of breath but is now wheezing or shortness today, using her albuterol inhaler more than usual with improvement of her wheezing and shortness of breath, as well as having a constant first chest wall pain 24 hours a day for the last few days worse with cough palpation and torso movement but nonexertional. There's been no fever no confusion no shortness breath today and no other concerns or treatment prior to arrival consisting of albuterol inhaler with improvement of her wheezing and shortness of breath. Past Medical History  Diagnosis Date  . Irritable bowel syndrome     constipation alternating with diarrhea  . Gastroesophageal reflux disease     Diverticulosis, hemorrhoids with rectal bleeding,  . Hyperlipidemia   . Glaucoma   . ASCVD (arteriosclerotic cardiovascular disease)     H/o PCI  . Diabetes mellitus type II   . Hypertension   . Degenerative joint disease     Right shoulder pain-mechanism unclear  . Depression   . Chronic obstructive pulmonary disease    Past Surgical History  Procedure Laterality Date  . Abdominal hysterectomy    . Appendectomy    . Cholecystectomy    . Colonoscopy  2007    Negative screening study except for diverticulosis   Family History  Problem Relation Age of Onset  . Aneurysm Mother     Cerebral  . Hypertension Mother    History  Substance Use Topics  . Smoking status: Never Smoker   . Smokeless tobacco: Never Used  . Alcohol Use: No   OB History   Grav Para Term Preterm Abortions TAB SAB Ect Mult Living                  Review of Systems  10 Systems reviewed and are negative for acute change except as noted in the HPI.  Allergies  Peanut-containing drug products; Catapres; Codeine; and Morphine  Home Medications   Prior to Admission medications   Medication Sig Start Date End Date Taking? Authorizing Provider  albuterol (PROVENTIL) (2.5 MG/3ML) 0.083% nebulizer solution Take 2.5 mg by nebulization 4 (four) times daily.   Yes Historical Provider, MD  ALPRAZolam Prudy Feeler(XANAX) 0.5 MG tablet Take 0.5 mg by mouth 3 (three) times daily as needed.   Yes Historical Provider, MD  amitriptyline (ELAVIL) 10 MG tablet Take 10 mg by mouth at bedtime as needed for sleep.    Yes Historical Provider, MD  amLODipine-benazepril (LOTREL) 10-20 MG per capsule Take 1 capsule by mouth daily. 07/16/12  Yes Kathlen Brunswickobert M Rothbart, MD  bimatoprost (LUMIGAN) 0.03 % ophthalmic drops Place 1 drop into both eyes at bedtime.     Yes Historical Provider, MD  esomeprazole (NEXIUM) 40 MG capsule Take 40 mg by mouth 2 (two) times daily with a meal. 02/26/11  Yes Tiffany KocherLeslie S Lewis, PA-C  HYDROcodone-acetaminophen (NORCO) 5-325 MG per tablet Take 1 tablet by mouth 4 (four) times daily.    Yes Historical Provider, MD  potassium chloride SA (K-DUR,KLOR-CON) 20 MEQ tablet TAKE 1 TABLET BY MOUTH EVERY DAY 02/07/13  Yes Jodelle GrossKathryn M Lawrence, NP  torsemide (DEMADEX) 20 MG  tablet TAKE 1 TABLET BY MOUTH DAILY 01/12/13  Yes Jodelle Gross, NP  doxycycline (VIBRAMYCIN) 100 MG capsule Take 1 capsule (100 mg total) by mouth 2 (two) times daily. One po bid x 7 days 08/12/13   Hurman Horn, MD  predniSONE (DELTASONE) 20 MG tablet 2 tabs po daily x 4 days 08/12/13   Hurman Horn, MD   BP 221/101  Pulse 78  Temp(Src) 98 F (36.7 C) (Oral)  Resp 20  Ht 5\' 3"  (1.6 m)  Wt 135 lb (61.236 kg)  BMI 23.92 kg/m2  SpO2 100% Physical Exam  Nursing note and vitals reviewed. Constitutional:  Awake, alert, nontoxic appearance.  HENT:  Head: Atraumatic.  Eyes: Right eye  exhibits no discharge. Left eye exhibits no discharge.  Neck: Neck supple.  Cardiovascular: Normal rate and regular rhythm.   No murmur heard. Pulmonary/Chest: Effort normal and breath sounds normal. No respiratory distress. She has no wheezes. She has no rales. She exhibits tenderness.  Reproducible anterior chest wall tenderness  Abdominal: Soft. Bowel sounds are normal. She exhibits no distension. There is no tenderness. There is no rebound and no guarding.  Musculoskeletal: She exhibits no edema and no tenderness.  Baseline ROM, no obvious new focal weakness.  Neurological: She is alert.  Mental status and motor strength appears baseline for patient and situation.  Skin: No rash noted.  Abnormal-appearing ~1cm dark mole left upper back which patient states has been there for years and she will followup with her doctor since apparently it has been biopsied previously but has been enlarging over the last several months  Psychiatric: She has a normal mood and affect.    ED Course  Procedures (including critical care time) Patient informed of clinical course, understand medical decision-making process, and agree with plan. Labs Review Labs Reviewed  CBC WITH DIFFERENTIAL - Abnormal; Notable for the following:    RBC 5.52 (*)    MCV 69.6 (*)    MCH 22.8 (*)    All other components within normal limits  BASIC METABOLIC PANEL - Abnormal; Notable for the following:    Potassium 2.9 (*)    Glucose, Bld 136 (*)    GFR calc non Af Amer 77 (*)    GFR calc Af Amer 89 (*)    All other components within normal limits  TROPONIN I    Imaging Review Dg Chest Portable 1 View  08/12/2013   CLINICAL DATA:  Chest pain.  EXAM: PORTABLE CHEST - 1 VIEW  COMPARISON:  Jun 19, 2008.  FINDINGS: The heart size and mediastinal contours are within normal limits. Both lungs are clear. No pneumothorax or pleural effusion is noted. The visualized skeletal structures are unremarkable.  IMPRESSION: No acute  cardiopulmonary abnormality seen.   Electronically Signed   By: Roque Lias M.D.   On: 08/12/2013 12:32     EKG Interpretation None      MDM   Final diagnoses:  Skin lesion of back  COPD with acute exacerbation    I doubt any other EMC precluding discharge at this time including, but not necessarily limited to the following:ACS, sepsis.    Hurman Horn, MD 08/12/13 [phone]

## 2013-08-12 NOTE — ED Notes (Signed)
MD Bednar made aware of pt's BP. Instructed to discharge pt and have her take her BP meds at home. Pt made aware.

## 2013-08-12 NOTE — ED Notes (Signed)
CRITICAL VALUE ALERT  Critical value received: potassium  2.9  Date of notification:  08/12/13  Time of notification:  1319  Critical value read back:yes  Nurse who received alert:  Garald BraverJ. Micalah Cabezas rn  MD notified (1st page):  1321  Time of first page:  1321  MD notified (2nd page):  Time of second page:  Responding MD: bednar  Time MD responded:  bednar

## 2013-08-12 NOTE — Discharge Instructions (Signed)
RETURN IMMEDIATELY IF you develop new shortness of breath, confusion or altered mental status, a new rash, become dizzy, faint, or poorly responsive, or are unable to be cared for at home.  Your caregiver has diagnosed you as having chest pain that is not specific for one problem, but does not require admission.  You are at low risk for an acute heart condition or other serious illness. Chest pain comes from many different causes.  SEEK IMMEDIATE MEDICAL ATTENTION IF: You have severe chest pain, especially if the pain is crushing or pressure-like and spreads to the arms, back, neck, or jaw, or if you have sweating, nausea (feeling sick to your stomach), or shortness of breath. THIS IS AN EMERGENCY. Don't wait to see if the pain will go away. Get medical help at once. Call 911 or 0 (operator). DO NOT drive yourself to the hospital.  Your chest pain gets worse and does not go away with rest.  You have an attack of chest pain lasting longer than usual, despite rest and treatment with the medications your caregiver has prescribed.  You wake from sleep with chest pain or shortness of breath.  You feel dizzy or faint.  You have chest pain not typical of your usual pain for which you originally saw your caregiver.

## 2013-09-08 ENCOUNTER — Encounter (HOSPITAL_COMMUNITY): Payer: Self-pay | Admitting: Emergency Medicine

## 2013-09-08 ENCOUNTER — Emergency Department (HOSPITAL_COMMUNITY): Payer: PRIVATE HEALTH INSURANCE

## 2013-09-08 ENCOUNTER — Emergency Department (HOSPITAL_COMMUNITY)
Admission: EM | Admit: 2013-09-08 | Discharge: 2013-09-08 | Disposition: A | Discharge status: Home / Self Care | Payer: PRIVATE HEALTH INSURANCE | Attending: Emergency Medicine | Admitting: Emergency Medicine

## 2013-09-08 DIAGNOSIS — Y929 Unspecified place or not applicable: Secondary | ICD-10-CM | POA: Diagnosis not present

## 2013-09-08 DIAGNOSIS — Y9389 Activity, other specified: Secondary | ICD-10-CM | POA: Insufficient documentation

## 2013-09-08 DIAGNOSIS — S40012A Contusion of left shoulder, initial encounter: Secondary | ICD-10-CM

## 2013-09-08 DIAGNOSIS — S298XXA Other specified injuries of thorax, initial encounter: Secondary | ICD-10-CM | POA: Insufficient documentation

## 2013-09-08 DIAGNOSIS — S0993XA Unspecified injury of face, initial encounter: Secondary | ICD-10-CM | POA: Insufficient documentation

## 2013-09-08 DIAGNOSIS — E119 Type 2 diabetes mellitus without complications: Secondary | ICD-10-CM | POA: Diagnosis not present

## 2013-09-08 DIAGNOSIS — W1809XA Striking against other object with subsequent fall, initial encounter: Secondary | ICD-10-CM | POA: Diagnosis not present

## 2013-09-08 DIAGNOSIS — S0990XA Unspecified injury of head, initial encounter: Secondary | ICD-10-CM | POA: Diagnosis present

## 2013-09-08 DIAGNOSIS — F329 Major depressive disorder, single episode, unspecified: Secondary | ICD-10-CM | POA: Diagnosis not present

## 2013-09-08 DIAGNOSIS — H409 Unspecified glaucoma: Secondary | ICD-10-CM | POA: Insufficient documentation

## 2013-09-08 DIAGNOSIS — Z79899 Other long term (current) drug therapy: Secondary | ICD-10-CM | POA: Insufficient documentation

## 2013-09-08 DIAGNOSIS — S0083XA Contusion of other part of head, initial encounter: Secondary | ICD-10-CM | POA: Diagnosis not present

## 2013-09-08 DIAGNOSIS — S8000XA Contusion of unspecified knee, initial encounter: Secondary | ICD-10-CM | POA: Diagnosis not present

## 2013-09-08 DIAGNOSIS — M542 Cervicalgia: Secondary | ICD-10-CM

## 2013-09-08 DIAGNOSIS — F3289 Other specified depressive episodes: Secondary | ICD-10-CM | POA: Insufficient documentation

## 2013-09-08 DIAGNOSIS — S40019A Contusion of unspecified shoulder, initial encounter: Secondary | ICD-10-CM | POA: Diagnosis not present

## 2013-09-08 DIAGNOSIS — S0003XA Contusion of scalp, initial encounter: Secondary | ICD-10-CM | POA: Diagnosis not present

## 2013-09-08 DIAGNOSIS — Z8739 Personal history of other diseases of the musculoskeletal system and connective tissue: Secondary | ICD-10-CM | POA: Diagnosis not present

## 2013-09-08 DIAGNOSIS — I10 Essential (primary) hypertension: Secondary | ICD-10-CM | POA: Insufficient documentation

## 2013-09-08 DIAGNOSIS — W19XXXA Unspecified fall, initial encounter: Secondary | ICD-10-CM

## 2013-09-08 DIAGNOSIS — S1093XA Contusion of unspecified part of neck, initial encounter: Principal | ICD-10-CM

## 2013-09-08 DIAGNOSIS — J4489 Other specified chronic obstructive pulmonary disease: Secondary | ICD-10-CM | POA: Insufficient documentation

## 2013-09-08 DIAGNOSIS — S8001XA Contusion of right knee, initial encounter: Secondary | ICD-10-CM

## 2013-09-08 DIAGNOSIS — E785 Hyperlipidemia, unspecified: Secondary | ICD-10-CM | POA: Insufficient documentation

## 2013-09-08 DIAGNOSIS — K219 Gastro-esophageal reflux disease without esophagitis: Secondary | ICD-10-CM | POA: Diagnosis not present

## 2013-09-08 DIAGNOSIS — S199XXA Unspecified injury of neck, initial encounter: Secondary | ICD-10-CM | POA: Diagnosis not present

## 2013-09-08 DIAGNOSIS — J449 Chronic obstructive pulmonary disease, unspecified: Secondary | ICD-10-CM | POA: Diagnosis not present

## 2013-09-08 DIAGNOSIS — S40022A Contusion of left upper arm, initial encounter: Secondary | ICD-10-CM

## 2013-09-08 MED ORDER — TRAMADOL HCL 50 MG PO TABS: 100.0000 mg | ORAL_TABLET | Freq: Four times a day (QID) | ORAL | Status: DC | PRN

## 2013-09-08 MED ORDER — METHOCARBAMOL 500 MG PO TABS: 500.0000 mg | ORAL_TABLET | Freq: Three times a day (TID) | ORAL | Status: DC | PRN

## 2013-09-08 MED ORDER — NAPROXEN 250 MG PO TABS
250.0000 mg | ORAL_TABLET | Freq: Once | ORAL | Status: AC
  Administered 2013-09-08: 250 mg via ORAL
  Filled 2013-09-08: qty 1

## 2013-09-08 MED ORDER — CYCLOBENZAPRINE HCL 10 MG PO TABS
5.0000 mg | ORAL_TABLET | Freq: Once | ORAL | Status: AC
  Administered 2013-09-08: 5 mg via ORAL
  Filled 2013-09-08 (×2): qty 1

## 2013-09-08 MED ORDER — NAPROXEN 250 MG PO TABS: 250.0000 mg | ORAL_TABLET | Freq: Two times a day (BID) | ORAL | Status: DC

## 2013-09-08 NOTE — ED Provider Notes (Signed)
CSN: [id number]     Arrival date & time 09/08/13  1430 History  This chart was scribed for Ward Givens, MD by Elon Spanner, ED Scribe. This patient was seen in room APA03/[facility]03 and the patient's care was started at 3:08 PM.    Chief Complaint  Patient presents with  . Fall   The history is provided by the patient. No language interpreter was used.    HPI Comments: Sheila Myers is a 80 y.o. female brought in by EMS who lives at home with her son and has an at-home aid for 3 hours a day presents to the Emergency Department with her aid complaining of a fall that occurred earlier today while trying to go into the bank.  Patient states she was stepping up on to a curb and did not step high enough.  She states she fell on her left side and hit the left side of her head and shoulder, and also has pain in her right knee.  She denies LOC and did not try to walk  at the scene after the fall.  She reports pain currently in her left neck, left shoulder, right knee, left upper chest.  Patient states she has a history of back pain and her current level of pain is normal to base line.  When she inhales deeply, she experiences chest pain. She denies a prior history of falls.  Patient denies blurred/double vision, nausea, dizziness, SOB.   Patient denies use of anticoagulants.   PCP: [name].  Orthopedics Dr Hilda Lias  Past Medical History  Diagnosis Date  . Irritable bowel syndrome     constipation alternating with diarrhea  . Gastroesophageal reflux disease     Diverticulosis, hemorrhoids with rectal bleeding,  . Hyperlipidemia   . Glaucoma   . ASCVD (arteriosclerotic cardiovascular disease)     H/o PCI  . Diabetes mellitus type II   . Hypertension   . Degenerative joint disease     Right shoulder pain-mechanism unclear  . Depression   . Chronic obstructive pulmonary disease    Past Surgical History  Procedure Laterality Date  . Abdominal hysterectomy    . Appendectomy    . Cholecystectomy     . Colonoscopy  2007    Negative screening study except for diverticulosis   Family History  Problem Relation Age of Onset  . Aneurysm Mother     Cerebral  . Hypertension Mother    History  Substance Use Topics  . Smoking status: Never Smoker   . Smokeless tobacco: Never Used  . Alcohol Use: No   Lives at home Lives with son Uses a cane sometimes  OB History   Grav Para Term Preterm Abortions TAB SAB Ect Mult Living                 Review of Systems  Eyes: Negative for visual disturbance.  Respiratory: Negative for shortness of breath.   Cardiovascular: Positive for chest pain.  Gastrointestinal: Negative for nausea.  Musculoskeletal: Positive for arthralgias.  Neurological: Negative for dizziness.  All other systems reviewed and are negative.     Allergies  Peanut-containing drug products; Catapres; Codeine; and Morphine  Home Medications   Prior to Admission medications   Medication Sig Start Date End Date Taking? Authorizing Provider  albuterol (PROVENTIL) (2.5 MG/3ML) 0.083% nebulizer solution Take 2.5 mg by nebulization 4 (four) times daily.    Historical Provider, MD  ALPRAZolam Prudy Feeler) 0.5 MG tablet Take 0.5 mg by mouth 3 (  three) times daily as needed.    Historical Provider, MD  amitriptyline (ELAVIL) 10 MG tablet Take 10 mg by mouth at bedtime as needed for sleep.     Historical Provider, MD  amLODipine-benazepril (LOTREL) 10-20 MG per capsule Take 1 capsule by mouth daily. 07/16/12   Kathlen Brunswickobert M Rothbart, MD  bimatoprost (LUMIGAN) 0.03 % ophthalmic drops Place 1 drop into both eyes at bedtime.      Historical Provider, MD  esomeprazole (NEXIUM) 40 MG capsule Take 40 mg by mouth 2 (two) times daily with a meal. 02/26/11   Tiffany KocherLeslie S Lewis, PA-C  hydrALAZINE (APRESOLINE) 25 MG tablet Take 25 mg by mouth 3 (three) times daily as needed. 08/29/13   Historical Provider, MD  HYDROcodone-acetaminophen (NORCO) 5-325 MG per tablet Take 1 tablet by mouth 4 (four) times  daily.     Historical Provider, MD  potassium chloride SA (K-DUR,KLOR-CON) 20 MEQ tablet TAKE 1 TABLET BY MOUTH EVERY DAY 02/07/13   Jodelle GrossKathryn M Lawrence, NP  pravastatin (PRAVACHOL) 40 MG tablet Take 40 mg by mouth at bedtime. 08/31/13   Historical Provider, MD  torsemide (DEMADEX) 20 MG tablet TAKE 1 TABLET BY MOUTH DAILY 01/12/13   Jodelle GrossKathryn M Lawrence, NP   BP 179/66  Pulse 75  Temp(Src) 97.9 F (36.6 C) (Oral)  Resp 16  Ht 5\' 3"  (1.6 m)  Wt 135 lb (61.236 kg)  BMI 23.92 kg/m2  SpO2 96%   Vital signs normal   Physical Exam  Nursing note and vitals reviewed. Constitutional: She is oriented to person, place, and time. She appears well-developed and well-nourished.  Non-toxic appearance. She does not appear ill. No distress.  Pt removed from backboard during my exam and C collar left in place.    HENT:  Head: Normocephalic and atraumatic.  Right Ear: External ear normal.  Left Ear: External ear normal.  Nose: Nose normal. No mucosal edema or rhinorrhea.  Mouth/Throat: Oropharynx is clear and moist and mucous membranes are normal. No dental abscesses or uvula swelling.  Tenderness in left zygoma  Eyes: Conjunctivae and EOM are normal. Pupils are equal, round, and reactive to light.  Neck: Full passive range of motion without pain.  C collar in place, indicates pain in her left neck  Cardiovascular: Normal rate, regular rhythm and normal heart sounds.  Exam reveals no gallop and no friction rub.   No murmur heard. Pulmonary/Chest: Effort normal and breath sounds normal. No respiratory distress. She has no wheezes. She has no rhonchi. She has no rales. She exhibits tenderness. She exhibits no crepitus.     Clavicles are minimally tender on the left.  Tenderness to left superior chest wall.  Abdominal: Soft. Normal appearance and bowel sounds are normal. She exhibits no distension. There is no tenderness. There is no rebound and no guarding.  Musculoskeletal: Normal range of motion.  She exhibits no edema and no tenderness.       Back:  Tender to superior thoracic and lumbosacral area which is old pain. Clavicles are minimally tender on the left.   Tenderness to palpation anteriorly on right knee with no abrasions or bruising.  Questionable small joint fusion on right knee.  ROM of right knee there is no pain in the hip.  Tender diffusely in her left shoulder with pain on ROM.    Neurological: She is alert and oriented to person, place, and time. She has normal strength. No cranial nerve deficit.  Skin: Skin is warm, dry and intact. No rash  noted. No erythema. No pallor.  Psychiatric: She has a normal mood and affect. Her speech is normal and behavior is normal. Her mood appears not anxious.    ED Course  Procedures (including critical care time)  Medications  naproxen (NAPROSYN) tablet 250 mg (250 mg Oral Given 09/08/13 1524)  cyclobenzaprine (FLEXERIL) tablet 5 mg (5 mg Oral Given 09/08/13 1524)     DIAGNOSTIC STUDIES: Oxygen Saturation is 96% on RA, normal by my interpretation.    COORDINATION OF CARE:  3:18 PM Will order labs and imaging.  Patient acknowledges and agrees with plan.    Pt and son given results of her radiology studies. C collar was removed. Pt's BUN/Creat on 7/16 were 9/0.75 which is normal, considered b/o starting NSAID medications.    Dg Ribs Unilateral W/chest Left  09/08/2013   CLINICAL DATA:  Left lower anterior rib pain status post trauma ; history of hypertension, COPD, and diabetes  EXAM: LEFT RIBS AND CHEST - 3+ VIEW  COMPARISON:  Portable chest x-ray of August 12, 2013  FINDINGS: The lungs are adequately inflated and clear. There is no pneumothorax or pleural effusion or pulmonary contusion. The cardiac silhouette is top-normal in size. There is tortuosity of the descending thoracic aorta. The symptomatic left lower anterior rib region was marked with a BB. Deep to this no acute fracture is demonstrated. Elsewhere the bony structures exhibit  no acute abnormalities.  IMPRESSION: There is no acute rib fracture demonstrated on this study. There is no acute cardiopulmonary abnormality. There is mild stable enlargement of the cardiac silhouette.   Electronically Signed   By: David  [place]   On: 09/08/2013 16:22   Ct Head Wo Contrast Ct Maxillofacial Wo Cm Ct Cervical Spine Wo Contrast  09/08/2013   CLINICAL DATA:  Left-sided facial and shoulder pain status post fall.  EXAM: CT HEAD WITHOUT CONTRAST  CT MAXILLOFACIAL WITHOUT CONTRAST  CT CERVICAL SPINE WITHOUT CONTRAST  TECHNIQUE: Multidetector CT imaging of the head, cervical spine, and maxillofacial structures were performed using the standard protocol without intravenous contrast. Multiplanar CT image reconstructions of the cervical spine and maxillofacial structures were also generated.  COMPARISON:  CT scan of the brain dated February 28, 2006.  FINDINGS: CT HEAD FINDINGS  There is mild age appropriate diffuse cerebral atrophy. The ventricles are normal in size and position. There is no intracranial hemorrhage nor intracranial mass effect nor acute ischemic change. There is subcentimeter hypodensity in the right cerebellar hemisphere new since 2008 consistent with an old lacunar infarction.  There is a retention cyst or polyp in the left maxillary sinus. No air-fluid levels are demonstrated in the visualized portions of the paranasal sinuses or mastoid air cells. There is no acute skull fracture.  CT MAXILLOFACIAL FINDINGS  The orbital structures are normal. There is a retention cyst or polyp in the left maxillary sinus with a smaller similar finding on the right. There are no air-fluid levels. The nasal passages are patent. The observed portions of the salivary glands are normal. There is no soft tissue hematoma over the face.  The bony orbits and paranasal sinus walls are intact. The nasal bones are intact. The zygomatic arches are normal. The pterygoid plates are intact. The temporomandibular  joints as well as the mandible itself exhibit no acute abnormalities.  CT CERVICAL SPINE FINDINGS  There is mild reversal of the normal cervical lordosis. The vertebral bodies are preserved in height. There is degenerative disc space narrowing at C3-4 through C6-7 with small  anterior and posterior endplate osteophytes. There is calcification within the soft tissues of the prevertebral region at the C2-3 level. No abnormal soft tissue mass is demonstrated.  There is no perched facet or spinous process fracture. The odontoid is intact. The observed portions of the first and second ribs are normal. There is apical pleural scarring bilaterally. There is a multinodular appearance of the mildly enlarged thyroid lobes.  IMPRESSION: 1. There is no acute intracranial hemorrhage nor an acute ischemic event. There is a small lacunar infarction in the right cerebellar hemisphere that has appeared since 2008. 2. There no acute skull fracture. 3. There is no acute facial bone fracture. The orbital structures are normal. 4. There is degenerative disc change at multiple mid cervical levels. There is mild reversal of the normal cervical lordosis likely reflecting muscle spasm. There is no acute fracture. 5. The thyroid lobes are mildly enlarged and exhibit a multinodular appearance. Elective thyroid ultrasound is recommended when the patient has recovered from her acute illness.   Electronically Signed   By: David  [place]   On: 09/08/2013 16:52   Dg Shoulder Left  09/08/2013   CLINICAL DATA:  Trauma and pain.  EXAM: LEFT SHOULDER - 2+ VIEW  COMPARISON:  10/01/2007  FINDINGS: Visualized portion of the left hemithorax is normal. No acute fracture or dislocation. Aortic atherosclerosis.  IMPRESSION: No acute osseous abnormality.   Electronically Signed   By: Jeronimo Greaves M.D.   On: 09/08/2013 16:21   Dg Knee Complete 4 Views Right  09/08/2013   CLINICAL DATA:  Lateral LEFT knee pain.  Fall.  EXAM: RIGHT KNEE - COMPLETE 4+ VIEW   COMPARISON:  None.  FINDINGS: The alignment of the RIGHT knee is anatomic. There is no knee effusion. Mild tricompartmental osteoarthritis is present with small marginal osteophytes. Severe atherosclerosis is present. No fracture is identified. Osteopenia. Approximately 3 cm sclerotic lesion is present in the distal femoral metaphysis, likely representing either bone infarct or densely calcified enchondroma. No osteolytic lesion or cortical osteolysis. Chondrocalcinosis of the lateral meniscus is noted.  IMPRESSION: No acute osseous abnormality.  Mild osteoarthritis of the knee.   Electronically Signed   By: Andreas Newport M.D.   On: 09/08/2013 16:22     EKG Interpretation None      MDM  Pt fell when she missed the curb. She has contusions.    Final diagnoses:  Fall, initial encounter  Contusion of face, initial encounter  Neck pain on left side  Contusion shoulder/arm, left, initial encounter  Contusion, knee, right, initial encounter   Discharge Medication List as of 09/08/2013  5:05 PM    START taking these medications   Details  methocarbamol (ROBAXIN) 500 MG tablet Take 1 tablet (500 mg total) by mouth every 8 (eight) hours as needed (muscle pain)., Starting 09/08/2013, Until Discontinued, Print    naproxen (NAPROSYN) 250 MG tablet Take 1 tablet (250 mg total) by mouth 2 (two) times daily with a meal., Starting 09/08/2013, Until Discontinued, Print    traMADol (ULTRAM) 50 MG tablet Take 2 tablets (100 mg total) by mouth every 6 (six) hours as needed., Starting 09/08/2013, Until Discontinued, Print        Plan discharge  Devoria Albe, MD, FACEP     I personally performed the services described in this documentation, which was scribed in my presence. The recorded information has been reviewed and considered.  Devoria Albe, MD, Armando Gang     Ward Givens, MD 09/08/13 [phone]

## 2013-09-08 NOTE — ED Notes (Signed)
Larey SeatFell today while stepping up on curb.  Landed on left side, hitting head on pavement.  Denies LOC.  C/o pain to left shoulder, left side of chest, left side of neck, left arm, and left leg.  No obvious bruising/abrasions/deformities noted.  Pt alert and oriented x 4.

## 2013-09-08 NOTE — Discharge Instructions (Signed)
Ice packs to the injured areas the next couple of days. Heat will help the muscles relax. Take the medications as prescribed. Return to the ED for any problems listed on the head injury sheet. Have Dr Hilda LiasKeeling recheck you if you are still having pain in a week.

## 2014-03-01 ENCOUNTER — Ambulatory Visit (HOSPITAL_COMMUNITY)
Admission: RE | Admit: 2014-03-01 | Discharge: 2014-03-01 | Disposition: A | Discharge status: Home / Self Care | Payer: Medicare Other | Source: Ambulatory Visit | Attending: Pulmonary Disease | Admitting: Pulmonary Disease

## 2014-03-01 ENCOUNTER — Other Ambulatory Visit (HOSPITAL_COMMUNITY): Payer: Self-pay | Admitting: Pulmonary Disease

## 2014-03-01 DIAGNOSIS — M25511 Pain in right shoulder: Secondary | ICD-10-CM | POA: Diagnosis not present

## 2014-03-01 DIAGNOSIS — M549 Dorsalgia, unspecified: Secondary | ICD-10-CM

## 2014-03-01 DIAGNOSIS — M25561 Pain in right knee: Secondary | ICD-10-CM

## 2014-03-01 DIAGNOSIS — M545 Low back pain: Secondary | ICD-10-CM | POA: Diagnosis not present

## 2014-03-01 DIAGNOSIS — M5032 Other cervical disc degeneration, mid-cervical region: Secondary | ICD-10-CM | POA: Diagnosis not present

## 2014-03-01 DIAGNOSIS — M542 Cervicalgia: Secondary | ICD-10-CM | POA: Diagnosis not present

## 2014-03-01 DIAGNOSIS — M25551 Pain in right hip: Secondary | ICD-10-CM

## 2014-03-01 DIAGNOSIS — M5441 Lumbago with sciatica, right side: Secondary | ICD-10-CM | POA: Diagnosis not present

## 2014-03-01 DIAGNOSIS — J449 Chronic obstructive pulmonary disease, unspecified: Secondary | ICD-10-CM | POA: Diagnosis not present

## 2014-03-01 DIAGNOSIS — R937 Abnormal findings on diagnostic imaging of other parts of musculoskeletal system: Secondary | ICD-10-CM | POA: Diagnosis not present

## 2014-03-01 DIAGNOSIS — E119 Type 2 diabetes mellitus without complications: Secondary | ICD-10-CM | POA: Diagnosis not present

## 2014-03-01 DIAGNOSIS — I2510 Atherosclerotic heart disease of native coronary artery without angina pectoris: Secondary | ICD-10-CM | POA: Diagnosis not present

## 2014-04-05 DIAGNOSIS — I10 Essential (primary) hypertension: Secondary | ICD-10-CM | POA: Diagnosis not present

## 2014-04-05 DIAGNOSIS — J449 Chronic obstructive pulmonary disease, unspecified: Secondary | ICD-10-CM | POA: Diagnosis not present

## 2014-04-05 DIAGNOSIS — E119 Type 2 diabetes mellitus without complications: Secondary | ICD-10-CM | POA: Diagnosis not present

## 2014-04-05 DIAGNOSIS — F251 Schizoaffective disorder, depressive type: Secondary | ICD-10-CM | POA: Diagnosis not present

## 2014-04-21 DIAGNOSIS — I10 Essential (primary) hypertension: Secondary | ICD-10-CM | POA: Diagnosis not present

## 2014-04-22 DIAGNOSIS — I10 Essential (primary) hypertension: Secondary | ICD-10-CM | POA: Diagnosis not present

## 2014-04-25 DIAGNOSIS — I10 Essential (primary) hypertension: Secondary | ICD-10-CM | POA: Diagnosis not present

## 2014-04-26 DIAGNOSIS — I10 Essential (primary) hypertension: Secondary | ICD-10-CM | POA: Diagnosis not present

## 2014-04-27 DIAGNOSIS — I10 Essential (primary) hypertension: Secondary | ICD-10-CM | POA: Diagnosis not present

## 2014-04-28 DIAGNOSIS — I10 Essential (primary) hypertension: Secondary | ICD-10-CM | POA: Diagnosis not present

## 2014-04-29 DIAGNOSIS — I10 Essential (primary) hypertension: Secondary | ICD-10-CM | POA: Diagnosis not present

## 2014-05-02 DIAGNOSIS — I10 Essential (primary) hypertension: Secondary | ICD-10-CM | POA: Diagnosis not present

## 2014-05-03 DIAGNOSIS — I10 Essential (primary) hypertension: Secondary | ICD-10-CM | POA: Diagnosis not present

## 2014-05-04 DIAGNOSIS — I10 Essential (primary) hypertension: Secondary | ICD-10-CM | POA: Diagnosis not present

## 2014-05-05 DIAGNOSIS — I10 Essential (primary) hypertension: Secondary | ICD-10-CM | POA: Diagnosis not present

## 2014-05-06 DIAGNOSIS — I10 Essential (primary) hypertension: Secondary | ICD-10-CM | POA: Diagnosis not present

## 2014-05-09 DIAGNOSIS — I10 Essential (primary) hypertension: Secondary | ICD-10-CM | POA: Diagnosis not present

## 2014-05-10 DIAGNOSIS — I10 Essential (primary) hypertension: Secondary | ICD-10-CM | POA: Diagnosis not present

## 2014-05-11 DIAGNOSIS — I10 Essential (primary) hypertension: Secondary | ICD-10-CM | POA: Diagnosis not present

## 2014-05-12 DIAGNOSIS — I10 Essential (primary) hypertension: Secondary | ICD-10-CM | POA: Diagnosis not present

## 2014-05-13 DIAGNOSIS — I10 Essential (primary) hypertension: Secondary | ICD-10-CM | POA: Diagnosis not present

## 2014-05-16 DIAGNOSIS — I10 Essential (primary) hypertension: Secondary | ICD-10-CM | POA: Diagnosis not present

## 2014-05-17 DIAGNOSIS — I10 Essential (primary) hypertension: Secondary | ICD-10-CM | POA: Diagnosis not present

## 2014-05-18 DIAGNOSIS — J449 Chronic obstructive pulmonary disease, unspecified: Secondary | ICD-10-CM | POA: Diagnosis not present

## 2014-05-18 DIAGNOSIS — I10 Essential (primary) hypertension: Secondary | ICD-10-CM | POA: Diagnosis not present

## 2014-05-19 DIAGNOSIS — I10 Essential (primary) hypertension: Secondary | ICD-10-CM | POA: Diagnosis not present

## 2014-05-20 DIAGNOSIS — I10 Essential (primary) hypertension: Secondary | ICD-10-CM | POA: Diagnosis not present

## 2014-05-23 DIAGNOSIS — I10 Essential (primary) hypertension: Secondary | ICD-10-CM | POA: Diagnosis not present

## 2014-05-24 DIAGNOSIS — E785 Hyperlipidemia, unspecified: Secondary | ICD-10-CM | POA: Diagnosis not present

## 2014-05-24 DIAGNOSIS — E1165 Type 2 diabetes mellitus with hyperglycemia: Secondary | ICD-10-CM | POA: Diagnosis not present

## 2014-05-24 DIAGNOSIS — I10 Essential (primary) hypertension: Secondary | ICD-10-CM | POA: Diagnosis not present

## 2014-05-24 DIAGNOSIS — J449 Chronic obstructive pulmonary disease, unspecified: Secondary | ICD-10-CM | POA: Diagnosis not present

## 2014-05-24 DIAGNOSIS — I2510 Atherosclerotic heart disease of native coronary artery without angina pectoris: Secondary | ICD-10-CM | POA: Diagnosis not present

## 2014-05-25 DIAGNOSIS — I10 Essential (primary) hypertension: Secondary | ICD-10-CM | POA: Diagnosis not present

## 2014-05-26 DIAGNOSIS — I10 Essential (primary) hypertension: Secondary | ICD-10-CM | POA: Diagnosis not present

## 2014-06-13 DIAGNOSIS — I10 Essential (primary) hypertension: Secondary | ICD-10-CM | POA: Diagnosis not present

## 2014-06-14 DIAGNOSIS — I10 Essential (primary) hypertension: Secondary | ICD-10-CM | POA: Diagnosis not present

## 2014-06-15 DIAGNOSIS — I10 Essential (primary) hypertension: Secondary | ICD-10-CM | POA: Diagnosis not present

## 2014-06-16 DIAGNOSIS — I10 Essential (primary) hypertension: Secondary | ICD-10-CM | POA: Diagnosis not present

## 2014-06-17 DIAGNOSIS — I10 Essential (primary) hypertension: Secondary | ICD-10-CM | POA: Diagnosis not present

## 2014-07-11 DIAGNOSIS — I10 Essential (primary) hypertension: Secondary | ICD-10-CM | POA: Diagnosis not present

## 2014-07-12 DIAGNOSIS — I10 Essential (primary) hypertension: Secondary | ICD-10-CM | POA: Diagnosis not present

## 2014-07-13 DIAGNOSIS — I10 Essential (primary) hypertension: Secondary | ICD-10-CM | POA: Diagnosis not present

## 2014-07-14 DIAGNOSIS — I10 Essential (primary) hypertension: Secondary | ICD-10-CM | POA: Diagnosis not present

## 2014-07-15 DIAGNOSIS — I10 Essential (primary) hypertension: Secondary | ICD-10-CM | POA: Diagnosis not present

## 2014-08-25 DIAGNOSIS — E119 Type 2 diabetes mellitus without complications: Secondary | ICD-10-CM | POA: Diagnosis not present

## 2014-08-25 DIAGNOSIS — J449 Chronic obstructive pulmonary disease, unspecified: Secondary | ICD-10-CM | POA: Diagnosis not present

## 2014-08-25 DIAGNOSIS — I10 Essential (primary) hypertension: Secondary | ICD-10-CM | POA: Diagnosis not present

## 2014-08-25 DIAGNOSIS — I2510 Atherosclerotic heart disease of native coronary artery without angina pectoris: Secondary | ICD-10-CM | POA: Diagnosis not present

## 2014-11-17 DIAGNOSIS — I10 Essential (primary) hypertension: Secondary | ICD-10-CM | POA: Diagnosis not present

## 2014-11-17 DIAGNOSIS — E119 Type 2 diabetes mellitus without complications: Secondary | ICD-10-CM | POA: Diagnosis not present

## 2014-11-17 DIAGNOSIS — J449 Chronic obstructive pulmonary disease, unspecified: Secondary | ICD-10-CM | POA: Diagnosis not present

## 2014-11-17 DIAGNOSIS — I2510 Atherosclerotic heart disease of native coronary artery without angina pectoris: Secondary | ICD-10-CM | POA: Diagnosis not present

## 2014-11-17 DIAGNOSIS — I25119 Atherosclerotic heart disease of native coronary artery with unspecified angina pectoris: Secondary | ICD-10-CM | POA: Diagnosis not present

## 2014-12-29 DIAGNOSIS — I25119 Atherosclerotic heart disease of native coronary artery with unspecified angina pectoris: Secondary | ICD-10-CM | POA: Diagnosis not present

## 2014-12-29 DIAGNOSIS — J449 Chronic obstructive pulmonary disease, unspecified: Secondary | ICD-10-CM | POA: Diagnosis not present

## 2014-12-29 DIAGNOSIS — E119 Type 2 diabetes mellitus without complications: Secondary | ICD-10-CM | POA: Diagnosis not present

## 2014-12-29 DIAGNOSIS — I10 Essential (primary) hypertension: Secondary | ICD-10-CM | POA: Diagnosis not present

## 2015-01-29 DIAGNOSIS — I639 Cerebral infarction, unspecified: Secondary | ICD-10-CM

## 2015-01-29 HISTORY — DX: Cerebral infarction, unspecified: I63.9

## 2015-02-15 ENCOUNTER — Encounter (HOSPITAL_COMMUNITY): Payer: Self-pay

## 2015-02-15 ENCOUNTER — Emergency Department (HOSPITAL_COMMUNITY)
Admission: EM | Admit: 2015-02-15 | Discharge: 2015-02-15 | Disposition: A | Discharge status: Home / Self Care | Payer: Medicare Other | Attending: Emergency Medicine | Admitting: Emergency Medicine

## 2015-02-15 ENCOUNTER — Emergency Department (HOSPITAL_COMMUNITY): Payer: Medicare Other

## 2015-02-15 DIAGNOSIS — H409 Unspecified glaucoma: Secondary | ICD-10-CM | POA: Diagnosis not present

## 2015-02-15 DIAGNOSIS — K219 Gastro-esophageal reflux disease without esophagitis: Secondary | ICD-10-CM | POA: Insufficient documentation

## 2015-02-15 DIAGNOSIS — E119 Type 2 diabetes mellitus without complications: Secondary | ICD-10-CM | POA: Diagnosis not present

## 2015-02-15 DIAGNOSIS — R0602 Shortness of breath: Secondary | ICD-10-CM | POA: Diagnosis not present

## 2015-02-15 DIAGNOSIS — J441 Chronic obstructive pulmonary disease with (acute) exacerbation: Secondary | ICD-10-CM | POA: Insufficient documentation

## 2015-02-15 DIAGNOSIS — Z8739 Personal history of other diseases of the musculoskeletal system and connective tissue: Secondary | ICD-10-CM | POA: Diagnosis not present

## 2015-02-15 DIAGNOSIS — Z8659 Personal history of other mental and behavioral disorders: Secondary | ICD-10-CM | POA: Insufficient documentation

## 2015-02-15 DIAGNOSIS — Z79899 Other long term (current) drug therapy: Secondary | ICD-10-CM | POA: Insufficient documentation

## 2015-02-15 DIAGNOSIS — R0600 Dyspnea, unspecified: Secondary | ICD-10-CM

## 2015-02-15 DIAGNOSIS — I10 Essential (primary) hypertension: Secondary | ICD-10-CM | POA: Diagnosis not present

## 2015-02-15 DIAGNOSIS — R05 Cough: Secondary | ICD-10-CM | POA: Diagnosis not present

## 2015-02-15 DIAGNOSIS — I2510 Atherosclerotic heart disease of native coronary artery without angina pectoris: Secondary | ICD-10-CM | POA: Diagnosis not present

## 2015-02-15 LAB — BASIC METABOLIC PANEL
Anion gap: 6 (ref 5–15)
BUN: 14 mg/dL (ref 6–20)
CHLORIDE: 107 mmol/L (ref 101–111)
CO2: 27 mmol/L (ref 22–32)
Calcium: 9.3 mg/dL (ref 8.9–10.3)
Creatinine, Ser: 0.81 mg/dL (ref 0.44–1.00)
Glucose, Bld: 134 mg/dL — ABNORMAL HIGH (ref 65–99)
POTASSIUM: 3.9 mmol/L (ref 3.5–5.1)
SODIUM: 140 mmol/L (ref 135–145)

## 2015-02-15 LAB — CBC WITH DIFFERENTIAL/PLATELET
BASOS ABS: 0 10*3/uL (ref 0.0–0.1)
Basophils Relative: 0 %
EOS ABS: 0.1 10*3/uL (ref 0.0–0.7)
Eosinophils Relative: 2 %
HCT: 40 % (ref 36.0–46.0)
HEMOGLOBIN: 12.2 g/dL (ref 12.0–15.0)
LYMPHS PCT: 27 %
Lymphs Abs: 1.9 10*3/uL (ref 0.7–4.0)
MCH: 21.7 pg — ABNORMAL LOW (ref 26.0–34.0)
MCHC: 30.5 g/dL (ref 30.0–36.0)
MCV: 71.2 fL — ABNORMAL LOW (ref 78.0–100.0)
MONOS PCT: 5 %
Monocytes Absolute: 0.4 10*3/uL (ref 0.1–1.0)
Neutro Abs: 4.6 10*3/uL (ref 1.7–7.7)
Neutrophils Relative %: 66 %
PLATELETS: 206 10*3/uL (ref 150–400)
RBC: 5.62 MIL/uL — AB (ref 3.87–5.11)
RDW: 15.7 % — ABNORMAL HIGH (ref 11.5–15.5)
WBC: 7 10*3/uL (ref 4.0–10.5)

## 2015-02-15 LAB — TROPONIN I

## 2015-02-15 NOTE — ED Notes (Signed)
MD Adriana Simas at beside.

## 2015-02-15 NOTE — Discharge Instructions (Signed)
Tests showed no life-threatening problems. Use your inhaler as needed. Follow-up your primary care doctor.

## 2015-02-15 NOTE — ED Notes (Signed)
Patient reports of productive cough since Sunday. States early this morning she began to feel short of breath and generalized weakness. Reports of pain in right side of neck that radiates into right shoulder. States she has seen PCP x3 for shoulder but pain is worse with shortness of breath. Denies fever. NAD noted.

## 2015-02-15 NOTE — ED Provider Notes (Signed)
CSN: [id number]     Arrival date & time 02/15/15  1300 History   First MD Initiated Contact with Patient 02/15/15 1308     Chief Complaint  Patient presents with  . Shortness of Breath     (Consider location/radiation/quality/duration/timing/severity/associated sxs/prior Treatment) HPI.... Cough and dyspnea since Saturday. Patient has a history of asthma and bronchitis. She used her inhaler with minimal relief. No anterior chest pain, diaphoresis, nausea. Severity of symptoms is mild to moderate. Nothing makes symptoms better or worse.  Past Medical History  Diagnosis Date  . Irritable bowel syndrome     constipation alternating with diarrhea  . Gastroesophageal reflux disease     Diverticulosis, hemorrhoids with rectal bleeding,  . Hyperlipidemia   . Glaucoma   . ASCVD (arteriosclerotic cardiovascular disease)     H/o PCI  . Diabetes mellitus type II   . Hypertension   . Degenerative joint disease     Right shoulder pain-mechanism unclear  . Depression   . Chronic obstructive pulmonary disease [facility])    Past Surgical History  Procedure Laterality Date  . Abdominal hysterectomy    . Appendectomy    . Cholecystectomy    . Colonoscopy  2007    Negative screening study except for diverticulosis   Family History  Problem Relation Age of Onset  . Aneurysm Mother     Cerebral  . Hypertension Mother    Social History  Substance Use Topics  . Smoking status: Never Smoker   . Smokeless tobacco: Never Used  . Alcohol Use: No   OB History    No data available     Review of Systems  All other systems reviewed and are negative.     Allergies  Peanut-containing drug products; Catapres; Codeine; and Morphine  Home Medications   Prior to Admission medications   Medication Sig Start Date End Date Taking? Authorizing Provider  albuterol (PROVENTIL HFA;VENTOLIN HFA) 108 (90 Base) MCG/ACT inhaler Inhale 2 puffs into the lungs 4 (four) times daily.   Yes Historical  Provider, MD  ALPRAZolam Prudy Feeler) 0.5 MG tablet Take 0.5 mg by mouth 3 (three) times daily as needed for anxiety.    Yes Historical Provider, MD  amLODipine-benazepril (LOTREL) 10-20 MG per capsule Take 1 capsule by mouth daily. 07/16/12  Yes Kathlen Brunswick, MD  bimatoprost (LUMIGAN) 0.03 % ophthalmic drops Place 1 drop into both eyes at bedtime.     Yes Historical Provider, MD  dextromethorphan-guaiFENesin (MUCINEX DM) 30-600 MG 12hr tablet Take 1 tablet by mouth 2 (two) times daily.   Yes Historical Provider, MD  esomeprazole (NEXIUM) 40 MG capsule Take 40 mg by mouth 2 (two) times daily with a meal. 02/26/11  Yes Tiffany Kocher, PA-C  hydrALAZINE (APRESOLINE) 25 MG tablet Take 25 mg by mouth 3 (three) times daily.  08/29/13  Yes Historical Provider, MD  HYDROcodone-acetaminophen (NORCO) 10-325 MG tablet Take 1 tablet by mouth 4 (four) times daily. 12/29/14  Yes Historical Provider, MD   BP 155/68 mmHg  Pulse 76  Temp(Src) 97.9 F (36.6 C) (Oral)  Resp 21  Ht  (1.6 m)  Wt 135 lb (61.236 kg)  BMI 23.92 kg/m2  SpO2 98% Physical Exam  Constitutional: She is oriented to person, place, and time. She appears well-developed and well-nourished.  No obvious dyspnea  HENT:  Head: Normocephalic and atraumatic.  Eyes: Conjunctivae and EOM are normal. Pupils are equal, round, and reactive to light.  Neck: Normal range of motion. Neck supple.  Cardiovascular: Normal rate and regular rhythm.   Pulmonary/Chest: Effort normal and breath sounds normal.  Abdominal: Soft. Bowel sounds are normal.  Musculoskeletal: Normal range of motion.  Neurological: She is alert and oriented to person, place, and time.  Skin: Skin is warm and dry.  Psychiatric: She has a normal mood and affect. Her behavior is normal.  Nursing note and vitals reviewed.   ED Course  Procedures (including critical care time) Labs Review Labs Reviewed  CBC WITH DIFFERENTIAL/PLATELET - Abnormal; Notable for the following:     RBC 5.62 (*)    MCV 71.2 (*)    MCH 21.7 (*)    RDW 15.7 (*)    All other components within normal limits  BASIC METABOLIC PANEL - Abnormal; Notable for the following:    Glucose, Bld 134 (*)    All other components within normal limits  TROPONIN I    Imaging Review Dg Chest 2 View  02/15/2015  CLINICAL DATA:  Shortness of breath.  Cough. EXAM: CHEST  2 VIEW COMPARISON:  09/08/2013 and 08/12/2013 FINDINGS: There is borderline cardiomegaly with tortuosity and calcification of the thoracic aorta. The lungs are clear. No effusions. No acute osseous abnormality. IMPRESSION: No acute abnormality. Chronic slight cardiomegaly. Aortic atherosclerosis. Coronary artery calcifications. Electronically Signed   By: Francene Boyers M.D.   On: 02/15/2015 14:50   I have personally reviewed and evaluated these images and lab results as part of my medical decision-making.   EKG Interpretation   Date/Time:  Wednesday February 15 2015 13:09:13 EST Ventricular Rate:  85 PR Interval:  168 QRS Duration: 95 QT Interval:  372 QTC Calculation: 442 R Axis:   92 Text Interpretation:  Sinus rhythm Anterior infarct, old Confirmed by Alycia Cooperwood   MD, Jesalyn Finazzo ([id number]) on 02/15/2015 2:07:30 PM      MDM   Final diagnoses:  Dyspnea   Screening tests including labs, EKG, troponin, chest x-ray all negative for acute findings. Follow-up with primary care doctor.    Donnetta Hutching, MD 02/15/15 [phone]

## 2015-02-20 DIAGNOSIS — I25119 Atherosclerotic heart disease of native coronary artery with unspecified angina pectoris: Secondary | ICD-10-CM | POA: Diagnosis not present

## 2015-02-20 DIAGNOSIS — J441 Chronic obstructive pulmonary disease with (acute) exacerbation: Secondary | ICD-10-CM | POA: Diagnosis not present

## 2015-02-20 DIAGNOSIS — I10 Essential (primary) hypertension: Secondary | ICD-10-CM | POA: Diagnosis not present

## 2015-04-12 DIAGNOSIS — M545 Low back pain: Secondary | ICD-10-CM | POA: Diagnosis not present

## 2015-04-12 DIAGNOSIS — R1110 Vomiting, unspecified: Secondary | ICD-10-CM | POA: Diagnosis not present

## 2015-05-02 DIAGNOSIS — E119 Type 2 diabetes mellitus without complications: Secondary | ICD-10-CM | POA: Diagnosis not present

## 2015-05-02 DIAGNOSIS — J449 Chronic obstructive pulmonary disease, unspecified: Secondary | ICD-10-CM | POA: Diagnosis not present

## 2015-05-02 DIAGNOSIS — R531 Weakness: Secondary | ICD-10-CM | POA: Diagnosis not present

## 2015-05-03 DIAGNOSIS — R531 Weakness: Secondary | ICD-10-CM | POA: Diagnosis not present

## 2015-05-03 DIAGNOSIS — E119 Type 2 diabetes mellitus without complications: Secondary | ICD-10-CM | POA: Diagnosis not present

## 2015-05-03 DIAGNOSIS — J449 Chronic obstructive pulmonary disease, unspecified: Secondary | ICD-10-CM | POA: Diagnosis not present

## 2015-06-01 DIAGNOSIS — I10 Essential (primary) hypertension: Secondary | ICD-10-CM | POA: Diagnosis not present

## 2015-06-01 DIAGNOSIS — J441 Chronic obstructive pulmonary disease with (acute) exacerbation: Secondary | ICD-10-CM | POA: Diagnosis not present

## 2015-06-01 DIAGNOSIS — E1165 Type 2 diabetes mellitus with hyperglycemia: Secondary | ICD-10-CM | POA: Diagnosis not present

## 2015-07-02 DIAGNOSIS — J441 Chronic obstructive pulmonary disease with (acute) exacerbation: Secondary | ICD-10-CM | POA: Diagnosis not present

## 2015-08-01 DIAGNOSIS — J441 Chronic obstructive pulmonary disease with (acute) exacerbation: Secondary | ICD-10-CM | POA: Diagnosis not present

## 2015-08-04 ENCOUNTER — Observation Stay (HOSPITAL_COMMUNITY): Payer: Medicare Other

## 2015-08-04 ENCOUNTER — Emergency Department (HOSPITAL_COMMUNITY): Payer: Medicare Other

## 2015-08-04 ENCOUNTER — Observation Stay (HOSPITAL_COMMUNITY)
Admission: EM | Admit: 2015-08-04 | Discharge: 2015-08-06 | Disposition: A | Discharge status: Home / Self Care | Payer: Medicare Other | Attending: Pulmonary Disease | Admitting: Pulmonary Disease

## 2015-08-04 ENCOUNTER — Encounter (HOSPITAL_COMMUNITY): Payer: Self-pay | Admitting: Emergency Medicine

## 2015-08-04 DIAGNOSIS — Z79899 Other long term (current) drug therapy: Secondary | ICD-10-CM | POA: Insufficient documentation

## 2015-08-04 DIAGNOSIS — M79601 Pain in right arm: Secondary | ICD-10-CM

## 2015-08-04 DIAGNOSIS — G459 Transient cerebral ischemic attack, unspecified: Principal | ICD-10-CM | POA: Diagnosis present

## 2015-08-04 DIAGNOSIS — I2510 Atherosclerotic heart disease of native coronary artery without angina pectoris: Secondary | ICD-10-CM | POA: Diagnosis not present

## 2015-08-04 DIAGNOSIS — R4781 Slurred speech: Secondary | ICD-10-CM | POA: Diagnosis not present

## 2015-08-04 DIAGNOSIS — F329 Major depressive disorder, single episode, unspecified: Secondary | ICD-10-CM | POA: Diagnosis not present

## 2015-08-04 DIAGNOSIS — I6523 Occlusion and stenosis of bilateral carotid arteries: Secondary | ICD-10-CM | POA: Diagnosis not present

## 2015-08-04 DIAGNOSIS — E785 Hyperlipidemia, unspecified: Secondary | ICD-10-CM | POA: Insufficient documentation

## 2015-08-04 DIAGNOSIS — Z95828 Presence of other vascular implants and grafts: Secondary | ICD-10-CM

## 2015-08-04 DIAGNOSIS — R4701 Aphasia: Secondary | ICD-10-CM | POA: Diagnosis present

## 2015-08-04 DIAGNOSIS — E119 Type 2 diabetes mellitus without complications: Secondary | ICD-10-CM | POA: Diagnosis not present

## 2015-08-04 DIAGNOSIS — I10 Essential (primary) hypertension: Secondary | ICD-10-CM

## 2015-08-04 LAB — COMPREHENSIVE METABOLIC PANEL
ALBUMIN: 3.8 g/dL (ref 3.5–5.0)
ALT: 9 U/L — ABNORMAL LOW (ref 14–54)
AST: 15 U/L (ref 15–41)
Alkaline Phosphatase: 52 U/L (ref 38–126)
Anion gap: 9 (ref 5–15)
BILIRUBIN TOTAL: 1.1 mg/dL (ref 0.3–1.2)
BUN: 16 mg/dL (ref 6–20)
CHLORIDE: 106 mmol/L (ref 101–111)
CO2: 24 mmol/L (ref 22–32)
Calcium: 9.2 mg/dL (ref 8.9–10.3)
Creatinine, Ser: 0.95 mg/dL (ref 0.44–1.00)
GFR calc Af Amer: >60 mL/min (ref >60)
GFR calc non Af Amer: 53 mL/min — ABNORMAL LOW (ref >60)
GLUCOSE: 126 mg/dL — AB (ref 65–99)
POTASSIUM: 3.4 mmol/L — AB (ref 3.5–5.1)
Sodium: 139 mmol/L (ref 135–145)
TOTAL PROTEIN: 7.2 g/dL (ref 6.5–8.1)

## 2015-08-04 LAB — CBC WITH DIFFERENTIAL/PLATELET
BASOS ABS: 0 10*3/uL (ref 0.0–0.1)
BASOS PCT: 0 %
EOS ABS: 0.3 10*3/uL (ref 0.0–0.7)
EOS PCT: 3 %
HCT: 41.4 % (ref 36.0–46.0)
Hemoglobin: 13.4 g/dL (ref 12.0–15.0)
Lymphocytes Relative: 42 %
Lymphs Abs: 3.5 10*3/uL (ref 0.7–4.0)
MCH: 22.9 pg — ABNORMAL LOW (ref 26.0–34.0)
MCHC: 32.4 g/dL (ref 30.0–36.0)
MCV: 70.9 fL — ABNORMAL LOW (ref 78.0–100.0)
MONO ABS: 0.8 10*3/uL (ref 0.1–1.0)
MONOS PCT: 9 %
Neutro Abs: 3.8 10*3/uL (ref 1.7–7.7)
Neutrophils Relative %: 46 %
PLATELETS: 200 10*3/uL (ref 150–400)
RBC: 5.84 MIL/uL — ABNORMAL HIGH (ref 3.87–5.11)
RDW: 15.6 % — AB (ref 11.5–15.5)
WBC: 8.4 10*3/uL (ref 4.0–10.5)

## 2015-08-04 LAB — URINE MICROSCOPIC-ADD ON

## 2015-08-04 LAB — URINALYSIS, ROUTINE W REFLEX MICROSCOPIC
BILIRUBIN URINE: NEGATIVE
Glucose, UA: NEGATIVE mg/dL
HGB URINE DIPSTICK: NEGATIVE
Ketones, ur: NEGATIVE mg/dL
Nitrite: NEGATIVE
PROTEIN: NEGATIVE mg/dL
Specific Gravity, Urine: 1.01 (ref 1.005–1.030)
pH: 6.5 (ref 5.0–8.0)

## 2015-08-04 LAB — GLUCOSE, CAPILLARY
Glucose-Capillary: 117 mg/dL — ABNORMAL HIGH (ref 65–99)
Glucose-Capillary: 107 mg/dL — ABNORMAL HIGH (ref 65–99)

## 2015-08-04 LAB — PROTIME-INR
INR: 1 (ref 0.00–1.49)
Prothrombin Time: 13.4 seconds (ref 11.6–15.2)

## 2015-08-04 LAB — CBG MONITORING, ED: Glucose-Capillary: 130 mg/dL — ABNORMAL HIGH (ref 65–99)

## 2015-08-04 LAB — APTT: APTT: 30 s (ref 24–37)

## 2015-08-04 LAB — I-STAT TROPONIN, ED: Troponin i, poc: 0.01 ng/mL (ref 0.00–0.08)

## 2015-08-04 MED ORDER — ENOXAPARIN SODIUM 40 MG/0.4ML ~~LOC~~ SOLN
40.0000 mg | SUBCUTANEOUS | Status: DC
  Administered 2015-08-04 – 2015-08-05 (×2): 40 mg via SUBCUTANEOUS
  Filled 2015-08-04 (×2): qty 0.4

## 2015-08-04 MED ORDER — ACETAMINOPHEN 650 MG RE SUPP: 650.0000 mg | Freq: Four times a day (QID) | RECTAL | Status: DC | PRN

## 2015-08-04 MED ORDER — ASPIRIN 300 MG RE SUPP: 300.0000 mg | Freq: Every day | RECTAL | Status: DC

## 2015-08-04 MED ORDER — PRAVASTATIN SODIUM 40 MG PO TABS
40.0000 mg | ORAL_TABLET | Freq: Every day | ORAL | Status: DC
  Administered 2015-08-05 – 2015-08-06 (×2): 40 mg via ORAL
  Filled 2015-08-04 (×2): qty 1

## 2015-08-04 MED ORDER — SODIUM CHLORIDE 0.9% FLUSH
3.0000 mL | Freq: Two times a day (BID) | INTRAVENOUS | Status: DC
  Administered 2015-08-05 – 2015-08-06 (×2): 3 mL via INTRAVENOUS

## 2015-08-04 MED ORDER — ASPIRIN 325 MG PO TABS
325.0000 mg | ORAL_TABLET | Freq: Once | ORAL | Status: AC
  Administered 2015-08-04: 325 mg via ORAL
  Filled 2015-08-04: qty 1

## 2015-08-04 MED ORDER — HYDRALAZINE HCL 25 MG PO TABS
25.0000 mg | ORAL_TABLET | Freq: Once | ORAL | Status: AC
  Administered 2015-08-04: 25 mg via ORAL
  Filled 2015-08-04 (×2): qty 1

## 2015-08-04 MED ORDER — SODIUM CHLORIDE 0.9 % IV SOLN: 250.0000 mL | INTRAVENOUS | Status: DC | PRN

## 2015-08-04 MED ORDER — PANTOPRAZOLE SODIUM 40 MG PO TBEC
40.0000 mg | DELAYED_RELEASE_TABLET | Freq: Two times a day (BID) | ORAL | Status: DC
  Administered 2015-08-04 – 2015-08-06 (×4): 40 mg via ORAL
  Filled 2015-08-04 (×4): qty 1

## 2015-08-04 MED ORDER — AMLODIPINE BESYLATE 5 MG PO TABS
10.0000 mg | ORAL_TABLET | Freq: Every day | ORAL | Status: DC
  Administered 2015-08-04 – 2015-08-06 (×3): 10 mg via ORAL
  Filled 2015-08-04 (×3): qty 2

## 2015-08-04 MED ORDER — ACETAMINOPHEN 325 MG PO TABS: 650.0000 mg | ORAL_TABLET | Freq: Four times a day (QID) | ORAL | Status: DC | PRN

## 2015-08-04 MED ORDER — IPRATROPIUM-ALBUTEROL 0.5-2.5 (3) MG/3ML IN SOLN: 3.0000 mL | Freq: Four times a day (QID) | RESPIRATORY_TRACT | Status: DC | PRN

## 2015-08-04 MED ORDER — ALPRAZOLAM 0.5 MG PO TABS: 0.5000 mg | ORAL_TABLET | Freq: Three times a day (TID) | ORAL | Status: DC | PRN

## 2015-08-04 MED ORDER — POLYETHYLENE GLYCOL 3350 17 G PO PACK: 17.0000 g | PACK | Freq: Every day | ORAL | Status: DC | PRN

## 2015-08-04 MED ORDER — SODIUM CHLORIDE 0.9% FLUSH
3.0000 mL | Freq: Two times a day (BID) | INTRAVENOUS | Status: DC
  Administered 2015-08-04 – 2015-08-06 (×4): 3 mL via INTRAVENOUS

## 2015-08-04 MED ORDER — LATANOPROST 0.005 % OP SOLN
1.0000 [drp] | Freq: Every day | OPHTHALMIC | Status: DC
  Administered 2015-08-04 – 2015-08-05 (×2): 1 [drp] via OPHTHALMIC
  Filled 2015-08-04: qty 2.5

## 2015-08-04 MED ORDER — HYDROCODONE-ACETAMINOPHEN 10-325 MG PO TABS
1.0000 | ORAL_TABLET | Freq: Four times a day (QID) | ORAL | Status: DC | PRN
  Administered 2015-08-05: 1 via ORAL
  Filled 2015-08-04: qty 1

## 2015-08-04 MED ORDER — INSULIN ASPART 100 UNIT/ML ~~LOC~~ SOLN
0.0000 [IU] | Freq: Three times a day (TID) | SUBCUTANEOUS | Status: DC
  Administered 2015-08-05: 2 [IU] via SUBCUTANEOUS

## 2015-08-04 MED ORDER — ASPIRIN 325 MG PO TABS
325.0000 mg | ORAL_TABLET | Freq: Every day | ORAL | Status: DC
  Administered 2015-08-05 – 2015-08-06 (×2): 325 mg via ORAL
  Filled 2015-08-04 (×2): qty 1

## 2015-08-04 MED ORDER — STROKE: EARLY STAGES OF RECOVERY BOOK
Freq: Once | Status: DC
  Filled 2015-08-04: qty 1

## 2015-08-04 MED ORDER — SODIUM CHLORIDE 0.9% FLUSH: 3.0000 mL | INTRAVENOUS | Status: DC | PRN

## 2015-08-04 MED ORDER — AMLODIPINE BESY-BENAZEPRIL HCL 10-20 MG PO CAPS: 1.0000 | ORAL_CAPSULE | Freq: Every day | ORAL | Status: DC

## 2015-08-04 MED ORDER — HYDRALAZINE HCL 25 MG PO TABS
25.0000 mg | ORAL_TABLET | Freq: Three times a day (TID) | ORAL | Status: DC
  Administered 2015-08-04 – 2015-08-06 (×5): 25 mg via ORAL
  Filled 2015-08-04 (×5): qty 1

## 2015-08-04 MED ORDER — BENAZEPRIL HCL 10 MG PO TABS
20.0000 mg | ORAL_TABLET | Freq: Every day | ORAL | Status: DC
  Administered 2015-08-04 – 2015-08-06 (×3): 20 mg via ORAL
  Filled 2015-08-04 (×3): qty 2

## 2015-08-04 NOTE — ED Notes (Signed)
Sheila Milletvelyn Myers [phone], pt friend, states she will provide d/c transportation if needed.

## 2015-08-04 NOTE — ED Notes (Signed)
Pt states she had right arm weakness x 2 days and was on the way to doctors office today around 9Am and noticed slurred speech, pt talking clear at present, pt sent here by Dr Juanetta GoslingHawkins

## 2015-08-04 NOTE — H&P (Signed)
History and Physical  Sheila Myers ZOX:[id number] DOB: 1928/07/16 DOA: 08/04/2015  PCP: Fredirick MaudlinHAWKINS,EDWARD L, MD  Patient coming from: home  Chief Complaint: aphasia  HPI:  82 year old woman with history of diet-controlled diabetes, hypertension, coronary artery disease, presented to PCP office with history of transient slurred speech. Because of this she was referred to the emergency department. Neurologic exam was unremarkable in the emergency department she was referred for TIA evaluation.  Patient reports she went to see her primary care physician today for right arm pain. Arm pain began 7/4 after using a vacuum cleaner. Is located in the upper arm and aggravated by any movement. She is unable to comb her hair. No numbness. Difficult to say whether there is but weakness or not given significant pain with movement. Pain seems to be worse during the day compared to the night.  While on the way to her primary care physician's office to have this evaluated she had an episode of slurred speech which lasted less than 5 minutes. No dysphagia. No numbness or tingling of the extremities. No weakness in the arms or legs noted. She does not normally take aspirin.  In the emergency department: Hypotensive, vitals otherwise stable. No hypoxia. She was nonacute for TPA secondary to no objective neurologic findings.  Pertinent labs: CMP, CBC, troponin, INR unremarkable EKG: Independently reviewed. SR, no acute changes Imaging: CT head no acute disease.   Review of Systems:  Negative for fever, new visual changes, sore throat, rash, chest pain, SOB, dysuria, bleeding, n/v/abdominal pain.  Past Medical History  Diagnosis Date  . Irritable bowel syndrome     constipation alternating with diarrhea  . Gastroesophageal reflux disease     Diverticulosis, hemorrhoids with rectal bleeding,  . Hyperlipidemia   . Glaucoma   . ASCVD (arteriosclerotic cardiovascular disease)     H/o PCI  . Diabetes  mellitus type II   . Hypertension   . Degenerative joint disease     Right shoulder pain-mechanism unclear  . Depression   . Chronic obstructive pulmonary disease [facility](HCC)     Past Surgical History  Procedure Laterality Date  . Abdominal hysterectomy    . Appendectomy    . Cholecystectomy    . Colonoscopy  2007    Negative screening study except for diverticulosis     reports that she has never smoked. She has never used smokeless tobacco. She reports that she does not drink alcohol or use illicit drugs. Ambulatory status: yes  Allergies  Allergen Reactions  . Peanut-Containing Drug Products Anaphylaxis, Itching and Rash    Allergy not confirmed-patient also mentions a rash and itching when eating peanut butter  . Catapres [Clonidine Hcl] Other (See Comments)    Dry mouth  . Codeine Other (See Comments)    "I just freak out"  . Morphine Other (See Comments)    "I just freak out"    Family History  Problem Relation Age of Onset  . Aneurysm Mother     Cerebral  . Hypertension Mother      Prior to Admission medications   Medication Sig Start Date End Date Taking? Authorizing Provider  albuterol (PROVENTIL HFA;VENTOLIN HFA) 108 (90 Base) MCG/ACT inhaler Inhale 2 puffs into the lungs 4 (four) times daily.   Yes Historical Provider, MD  amLODipine-benazepril (LOTREL) 10-20 MG per capsule Take 1 capsule by mouth daily. 07/16/12  Yes Kathlen Brunswickobert M Rothbart, MD  bimatoprost (LUMIGAN) 0.03 % ophthalmic drops Place 1 drop into both eyes at bedtime.  Yes Historical Provider, MD  esomeprazole (NEXIUM) 40 MG capsule Take 40 mg by mouth 2 (two) times daily with a meal. 02/26/11  Yes Tiffany Kocher, PA-C  hydrALAZINE (APRESOLINE) 25 MG tablet Take 25 mg by mouth 3 (three) times daily.  08/29/13  Yes Historical Provider, MD  HYDROcodone-acetaminophen (NORCO) 10-325 MG tablet Take 1 tablet by mouth 4 (four) times daily. 12/29/14  Yes Historical Provider, MD  lidocaine (XYLOCAINE) 5 % ointment  Apply 1 application topically daily. To legs and feet 07/24/15  Yes Historical Provider, MD  pravastatin (PRAVACHOL) 40 MG tablet Take 40 mg by mouth daily. 06/30/15  Yes Historical Provider, MD  ALPRAZolam Prudy Feeler) 0.5 MG tablet Take 0.5 mg by mouth 3 (three) times daily as needed for anxiety.     Historical Provider, MD  dextromethorphan-guaiFENesin (MUCINEX DM) 30-600 MG 12hr tablet Take 1 tablet by mouth 2 (two) times daily as needed for cough.     Historical Provider, MD  ipratropium-albuterol (DUONEB) 0.5-2.5 (3) MG/3ML SOLN Inhale 3 mLs into the lungs every 6 (six) hours as needed. Shortness of breath/wheezing 07/31/15   Historical Provider, MD    Physical Exam: Filed Vitals:   08/04/15 1422 08/04/15 1512 08/04/15 1530 08/04/15 1555  BP: 228/89 228/87 223/90 237/88  Pulse: 81  68 70  Temp:    98.2 F (36.8 C)  TempSrc:    Oral  Resp: Height:     (1.6 m)  Weight:    57.834 kg (127 lb 8 oz)  SpO2: 97%  98% 98%   Constitutional:  . Appears calm and comfortable Eyes:  . PERRL and irises appear normal . Normal conjunctivae and lids ENMT:  . external ears, nose appear normal . grossly normal hearing . Lips appear normal . Oropharynx: mucosa, tongue appear normal Neck:  . neck appears normal, no masses, normal ROM, supple . no thyromegaly Respiratory:  . CTA bilaterally, no w/r/r.  . Respiratory effort normal. No retractions or accessory muscle use Cardiovascular:  . RRR, no m/r/g . No LE extremity edema   Abdomen:  . Abdomen appears normal; no tenderness or masses Musculoskeletal:  . LUE, RLE, LLE   o strength and tone normal, no atrophy, no abnormal movements o No tenderness, masses . RUE o Tenderness with palpation over the deltoid. Difficulty abducting shoulder, shrugging shoulder, range of movement limited by pain. No swelling. No gross deformity noted. Skin appears unremarkable Skin:  . No rashes, lesions, ulcers . palpation of skin: no induration or  nodules Neurologic:  . CN 2-12 intact . No dysdiadochokinesis upper extremities . No pronator drift . No ataxia, ambulates well without assistance, stands easily without assistance . Negative Romberg Psychiatric:  . judgement and insight appear normal . Mental status o Mood, affect appropriate  Wt Readings from Last 3 Encounters:  08/04/15 57.834 kg (127 lb 8 oz)  02/15/15 61.236 kg (135 lb)  09/08/13 61.236 kg (135 lb)    I have personally reviewed following labs and imaging studies  Labs on Admission:  CBC:  Recent Labs Lab 08/04/15 1000  WBC 8.4  NEUTROABS 3.8  HGB 13.4  HCT 41.4  MCV 70.9*  PLT 200   Basic Metabolic Panel:  Recent Labs Lab 08/04/15 1000  NA 139  K 3.4*  CL 106  CO2 24  GLUCOSE 126*  BUN 16  CREATININE 0.95  CALCIUM 9.2   Liver Function Tests:  Recent Labs Lab 08/04/15 1000  AST 15  ALT  9*  ALKPHOS 52  BILITOT 1.1  PROT 7.2  ALBUMIN 3.8   Coagulation Profile:  Recent Labs Lab 08/04/15 1000  INR 1.00   CBG:  Recent Labs Lab 08/04/15 0954  GLUCAP 130*   Urine analysis:    Component Value Date/Time   COLORURINE YELLOW 08/04/2015 1223   APPEARANCEUR CLEAR 08/04/2015 1223   LABSPEC 1.010 08/04/2015 1223   PHURINE 6.5 08/04/2015 1223   GLUCOSEU NEGATIVE 08/04/2015 1223   HGBUR NEGATIVE 08/04/2015 1223   BILIRUBINUR NEGATIVE 08/04/2015 1223   KETONESUR NEGATIVE 08/04/2015 1223   PROTEINUR NEGATIVE 08/04/2015 1223   UROBILINOGEN 1.0 02/16/2007 0846   NITRITE NEGATIVE 08/04/2015 1223   LEUKOCYTESUR LARGE* 08/04/2015 1223   Radiological Exams on Admission: Ct Head Wo Contrast  08/04/2015  CLINICAL DATA:  Right arm weakness for 2 days, slurred speech today. EXAM: CT HEAD WITHOUT CONTRAST TECHNIQUE: Contiguous axial images were obtained from the base of the skull through the vertex without intravenous contrast. COMPARISON:  None. FINDINGS: Brain: There is mild generalized age related atrophy with commensurate dilatation  of the sulci. Ventricles remain normal in size and configuration. Small old infarct noted within the right cerebellum with associated encephalomalacia. There is no mass, hemorrhage, edema or other evidence of acute parenchymal abnormality. Vascular: No hyperdense vessel or unexpected calcification. There are chronic calcified atherosclerotic changes of the large vessels at the skull base. Skull: Negative for fracture or focal lesion. Sinuses/Orbits: No acute findings. Near complete opacification of the upper left maxillary sinus, likely chronic. Remainder the upper paranasal sinuses appear clear. Other: None. IMPRESSION: 1. No acute intracranial findings. No intracranial mass, hemorrhage or edema. 2. Left maxillary sinus disease, of uncertain age, probably chronic. Electronically Signed   By: Bary RichardStan  Maynard M.D.   On: 08/04/2015 11:30    EKG: Independently reviewed. SR, no significant changes    Principal Problem:   Slurred speech Active Problems:   Arteriosclerotic cardiovascular disease (ASCVD)   Diabetes mellitus, type II (HCC)   TIA (transient ischemic attack)   Accelerated hypertension   Right arm pain   Assessment/Plan 1. Transient slurred speech suspicious for TIA. No focal neurologic deficits currently. Not on aspirin.  2. Accelerated HTN isolated systolic SBP >200 3. Right arm pain and weakness, shoulder pain likely rotator cuff pathology. 4. Equivocal urinalysis, no dysuria, hesitancy. No further evaluation suggested. 5. DM type 2, random blood sugar 126, normal anion gap. 6. Hyperlipidemia 7. ASCVD s/p PCI 8. COPD. Stable. No evidence of wheeze.   Obs to telemetry bed. Appears stable. No current focal neurologic deficits.  Stroke/TIA evaluation including MRI brain, MRA head, carotids, echo  Therapy evaluations  ASA daily  Follow BP, restart hydralazine, antihypertensives  SSI  DVT prophylaxis: Lovenox Code Status: Full Family Communication: none Disposition Plan:  obs to telemetry  Time spent: 55 minutes  Brendia Sacksaniel Goodrich, MD  [facility] Direct contact: [phone] --Via amion app OR  --[email or web address]; password [email or web address]  7PM-7AM contact night coverage as above  08/04/2015, 4:54 PM  By signing my name below, I, Sheila Myers, attest that this documentation has been prepared under the direction and in the presence of Daniel P. Irene LimboGoodrich, MD. Electronically Signed: Adron BeneGreylon Myers, Scribe.  08/04/2015 2:00pm  I personally performed the services described in this documentation. All medical record entries made by the scribe were at my direction. I have reviewed the chart and agree that the record reflects my personal performance and is accurate and complete. Brendia Sacksaniel Goodrich, MD

## 2015-08-04 NOTE — ED Provider Notes (Signed)
CSN: [id number]     Arrival date & time 08/04/15  [id number] History  By signing my name below, I, Jasmyn B. Alexander, attest that this documentation has been prepared under the direction and in the presence of Donnetta HutchingBrian Aminta Sakurai, MD.  Electronically Signed: Gillis EndsJasmyn B. Lyn HollingsheadAlexander, ED Scribe. 08/04/2015. 10:20 AM.     Chief Complaint  Patient presents with  . Aphasia   The history is provided by the patient. No language interpreter was used.    HPI Comments: Sheila Myers is a 82 y.o. female with PMHx of DM, HTN, and HLD who presents to the Emergency Department complaining of gradual onset, constant, right arm pain x 2 days. Pt has associated weakness of the right arm and myalgia present in right shoulder joint. Pt was sent here by Dr. Juanetta GoslingHawkins, her PCP, after she reports having slurred speech on her way to her doctor's appointment around 0915. She reports that slurred speech only lasted about 5 minutes, and it has now resolved. Right arm pain is exacerbated with movement of the right arm. Pt states she broke both arms in 2001. Pt reports living at home with her son and she was brought to [facility] by her neighbor. She admits to having placement of three cardiac stents in 1991 and 1992 by [facility] [place].  Past Medical History  Diagnosis Date  . Irritable bowel syndrome     constipation alternating with diarrhea  . Gastroesophageal reflux disease     Diverticulosis, hemorrhoids with rectal bleeding,  . Hyperlipidemia   . Glaucoma   . ASCVD (arteriosclerotic cardiovascular disease)     H/o PCI  . Diabetes mellitus type II   . Hypertension   . Degenerative joint disease     Right shoulder pain-mechanism unclear  . Depression   . Chronic obstructive pulmonary disease [facility](HCC)    Past Surgical History  Procedure Laterality Date  . Abdominal hysterectomy    . Appendectomy    . Cholecystectomy    . Colonoscopy  2007    Negative screening study except for diverticulosis   Family History   Problem Relation Age of Onset  . Aneurysm Mother     Cerebral  . Hypertension Mother    Social History  Substance Use Topics  . Smoking status: Never Smoker   . Smokeless tobacco: Never Used  . Alcohol Use: No   OB History    No data available     Review of Systems  Constitutional: Negative for fever.  Musculoskeletal: Positive for myalgias and arthralgias.  Neurological: Positive for speech difficulty.  All other systems reviewed and are negative.  Allergies  Peanut-containing drug products; Catapres; Codeine; and Morphine  Home Medications   Prior to Admission medications   Medication Sig Start Date End Date Taking? Authorizing Provider  albuterol (PROVENTIL HFA;VENTOLIN HFA) 108 (90 Base) MCG/ACT inhaler Inhale 2 puffs into the lungs 4 (four) times daily.   Yes Historical Provider, MD  amLODipine-benazepril (LOTREL) 10-20 MG per capsule Take 1 capsule by mouth daily. 07/16/12  Yes Kathlen Brunswickobert M Rothbart, MD  bimatoprost (LUMIGAN) 0.03 % ophthalmic drops Place 1 drop into both eyes at bedtime.     Yes Historical Provider, MD  esomeprazole (NEXIUM) 40 MG capsule Take 40 mg by mouth 2 (two) times daily with a meal. 02/26/11  Yes Tiffany KocherLeslie S Lewis, PA-C  hydrALAZINE (APRESOLINE) 25 MG tablet Take 25 mg by mouth 3 (three) times daily.  08/29/13  Yes Historical Provider, MD  HYDROcodone-acetaminophen (NORCO) 10-325 MG  tablet Take 1 tablet by mouth 4 (four) times daily. 12/29/14  Yes Historical Provider, MD  lidocaine (XYLOCAINE) 5 % ointment Apply 1 application topically daily. To legs and feet 07/24/15  Yes Historical Provider, MD  pravastatin (PRAVACHOL) 40 MG tablet Take 40 mg by mouth daily. 06/30/15  Yes Historical Provider, MD  ALPRAZolam Prudy Feeler) 0.5 MG tablet Take 0.5 mg by mouth 3 (three) times daily as needed for anxiety.     Historical Provider, MD  dextromethorphan-guaiFENesin (MUCINEX DM) 30-600 MG 12hr tablet Take 1 tablet by mouth 2 (two) times daily as needed for cough.      Historical Provider, MD  ipratropium-albuterol (DUONEB) 0.5-2.5 (3) MG/3ML SOLN Inhale 3 mLs into the lungs every 6 (six) hours as needed. Shortness of breath/wheezing 07/31/15   Historical Provider, MD   BP 221/78 mmHg  Pulse 67  Temp(Src) 97.7 F (36.5 C) (Oral)  Resp 18  SpO2 93% Physical Exam  Constitutional: She is oriented to person, place, and time. She appears well-developed and well-nourished.  HENT:  Head: Normocephalic and atraumatic.  Eyes: Conjunctivae are normal.  Neck: Neck supple.  Cardiovascular: Normal rate and regular rhythm.   Pulmonary/Chest: Effort normal and breath sounds normal.  Abdominal: Soft. Bowel sounds are normal.  Musculoskeletal:  Pain in the right shoulder with ROM.  Neurological: She is alert and oriented to person, place, and time.  RUE is moderately weak.  Skin: Skin is warm and dry.  Psychiatric: She has a normal mood and affect. Her behavior is normal.  Nursing note and vitals reviewed.   ED Course  Procedures (including critical care time) DIAGNOSTIC STUDIES: Oxygen Saturation is 95% on RA, adequate by my interpretation.    COORDINATION OF CARE: 10:03 AM-Discussed treatment plan which includes CBC, CMP, UA, CBG, EKG, Head CT, and Neuro checks with pt at bedside and pt agreed to plan. Possibility of TIA.  Labs Review Labs Reviewed  URINALYSIS, ROUTINE W REFLEX MICROSCOPIC (NOT AT [facility]) - Abnormal; Notable for the following:    Leukocytes, UA LARGE (*)    All other components within normal limits  CBC WITH DIFFERENTIAL/PLATELET - Abnormal; Notable for the following:    RBC 5.84 (*)    MCV 70.9 (*)    MCH 22.9 (*)    RDW 15.6 (*)    All other components within normal limits  COMPREHENSIVE METABOLIC PANEL - Abnormal; Notable for the following:    Potassium 3.4 (*)    Glucose, Bld 126 (*)    ALT 9 (*)    GFR calc non Af Amer 53 (*)    All other components within normal limits  URINE MICROSCOPIC-ADD ON - Abnormal; Notable for the  following:    Squamous Epithelial / LPF 6-30 (*)    Bacteria, UA FEW (*)    All other components within normal limits  CBG MONITORING, ED - Abnormal; Notable for the following:    Glucose-Capillary 130 (*)    All other components within normal limits  PROTIME-INR  APTT  I-STAT TROPOININ, ED    Imaging Review Ct Head Wo Contrast  08/04/2015  CLINICAL DATA:  Right arm weakness for 2 days, slurred speech today. EXAM: CT HEAD WITHOUT CONTRAST TECHNIQUE: Contiguous axial images were obtained from the base of the skull through the vertex without intravenous contrast. COMPARISON:  None. FINDINGS: Brain: There is mild generalized age related atrophy with commensurate dilatation of the sulci. Ventricles remain normal in size and configuration. Small old infarct noted within the right cerebellum  with associated encephalomalacia. There is no mass, hemorrhage, edema or other evidence of acute parenchymal abnormality. Vascular: No hyperdense vessel or unexpected calcification. There are chronic calcified atherosclerotic changes of the large vessels at the skull base. Skull: Negative for fracture or focal lesion. Sinuses/Orbits: No acute findings. Near complete opacification of the upper left maxillary sinus, likely chronic. Remainder the upper paranasal sinuses appear clear. Other: None. IMPRESSION: 1. No acute intracranial findings. No intracranial mass, hemorrhage or edema. 2. Left maxillary sinus disease, of uncertain age, probably chronic. Electronically Signed   By: Bary RichardStan  Maynard M.D.   On: 08/04/2015 11:30   I have personally reviewed and evaluated these images and lab results as part of my medical decision-making.   EKG Interpretation   Date/Time:  Friday August 04 2015 09:56:07 EDT Ventricular Rate:  87 PR Interval:    QRS Duration: 94 QT Interval:  362 QTC Calculation: 436 R Axis:   27 Text Interpretation:  Sinus rhythm Borderline T abnormalities, lateral  leads Minimal ST elevation, anterior  leads No STEMI. No significant  changes from prior.  Confirmed by LONG MD, JOSHUA [phone] on 08/04/2015  9:59:14 AM      MDM   Final diagnoses:  Transient cerebral ischemia, unspecified transient cerebral ischemia type    History suggestive of TIA. Speech is now back to normal. right arm involvement is ? secondary to musculoskeletal pain.  Will discuss with hospitalist.  I personally performed the services described in this documentation, which was scribed in my presence. The recorded information has been reviewed and is accurate.     Donnetta HutchingBrian Max Nuno, MD 08/04/15 1328

## 2015-08-05 ENCOUNTER — Observation Stay (HOSPITAL_BASED_OUTPATIENT_CLINIC_OR_DEPARTMENT_OTHER): Payer: Medicare Other

## 2015-08-05 ENCOUNTER — Observation Stay (HOSPITAL_COMMUNITY): Payer: Medicare Other

## 2015-08-05 DIAGNOSIS — G459 Transient cerebral ischemic attack, unspecified: Secondary | ICD-10-CM | POA: Diagnosis not present

## 2015-08-05 DIAGNOSIS — R4781 Slurred speech: Secondary | ICD-10-CM | POA: Diagnosis not present

## 2015-08-05 DIAGNOSIS — I6621 Occlusion and stenosis of right posterior cerebral artery: Secondary | ICD-10-CM | POA: Diagnosis not present

## 2015-08-05 LAB — GLUCOSE, CAPILLARY
GLUCOSE-CAPILLARY: 97 mg/dL (ref 65–99)
GLUCOSE-CAPILLARY: 105 mg/dL — AB (ref 65–99)
GLUCOSE-CAPILLARY: 102 mg/dL — AB (ref 65–99)
Glucose-Capillary: 152 mg/dL — ABNORMAL HIGH (ref 65–99)

## 2015-08-05 LAB — ECHOCARDIOGRAM COMPLETE
Height: 63 in
Weight: 2040 oz

## 2015-08-05 LAB — LIPID PANEL
Cholesterol: 154 mg/dL (ref 0–200)
HDL: 34 mg/dL — AB (ref >40)
LDL CALC: 104 mg/dL — AB (ref 0–99)
Total CHOL/HDL Ratio: 4.5 RATIO
Triglycerides: 80 mg/dL (ref <150)
VLDL: 16 mg/dL (ref 0–40)

## 2015-08-05 NOTE — Progress Notes (Signed)
Subjective: She was admitted yesterday with about a 3 day history of weakness and slurred speech. Although the history that she gave to Dr. Sarajane Jews yesterday was that she came in for arm pain after using a vacuum cleaner her family who came with her to my office said that she had right-sided weakness and clumsiness plus the slurred speech. At that point she was sent to the emergency department.  Objective: Vital signs in last 24 hours: Temp:  [97.4 F (36.3 C)-98.5 F (36.9 C)] 97.4 F (36.3 C) (07/08 0555) Pulse Rate:  [59-85] 59 (07/08 0555) Resp:  [16-23] 18 (07/08 0555) BP: (133-237)/(50-90) 152/55 mmHg (07/08 0555) SpO2:  [93 %-100 %] 100 % (07/08 0555) Weight:  [57.834 kg (127 lb 8 oz)] 57.834 kg (127 lb 8 oz) (07/07 1555) Weight change:  Last BM Date: 08/04/15  Intake/Output from previous day: 07/07 0701 - 07/08 0700 In: 240 [P.O.:240] Out: -   PHYSICAL EXAM General appearance: alert, cooperative and mild distress Resp: clear to auscultation bilaterally Cardio: regular rate and rhythm, S1, S2 normal, no murmur, click, rub or gallop GI: soft, non-tender; bowel sounds normal; no masses,  no organomegaly Extremities: extremities normal, atraumatic, no cyanosis or edema  Lab Results:  Results for orders placed or performed during the hospital encounter of 08/04/15 (from the past 48 hour(s))  CBG monitoring, ED     Status: Abnormal   Collection Time: 08/04/15  9:54 AM  Result Value Ref Range   Glucose-Capillary 130 (H) 65 - 99 mg/dL   Comment 1 Notify RN   Protime-INR     Status: None   Collection Time: 08/04/15 10:00 AM  Result Value Ref Range   Prothrombin Time 13.4 11.6 - 15.2 seconds   INR 1.00 0.00 - 1.49  APTT     Status: None   Collection Time: 08/04/15 10:00 AM  Result Value Ref Range   aPTT 30 24 - 37 seconds  CBC WITH DIFFERENTIAL     Status: Abnormal   Collection Time: 08/04/15 10:00 AM  Result Value Ref Range   WBC 8.4 4.0 - 10.5 K/uL   RBC 5.84 (H)  3.87 - 5.11 MIL/uL   Hemoglobin 13.4 12.0 - 15.0 g/dL   HCT 41.4 36.0 - 46.0 %   MCV 70.9 (L) 78.0 - 100.0 fL   MCH 22.9 (L) 26.0 - 34.0 pg   MCHC 32.4 30.0 - 36.0 g/dL   RDW 15.6 (H) 11.5 - 15.5 %   Platelets 200 150 - 400 K/uL   Neutrophils Relative % 46 %   Neutro Abs 3.8 1.7 - 7.7 K/uL   Lymphocytes Relative 42 %   Lymphs Abs 3.5 0.7 - 4.0 K/uL   Monocytes Relative 9 %   Monocytes Absolute 0.8 0.1 - 1.0 K/uL   Eosinophils Relative 3 %   Eosinophils Absolute 0.3 0.0 - 0.7 K/uL   Basophils Relative 0 %   Basophils Absolute 0.0 0.0 - 0.1 K/uL  Comprehensive metabolic panel     Status: Abnormal   Collection Time: 08/04/15 10:00 AM  Result Value Ref Range   Sodium 139 135 - 145 mmol/L   Potassium 3.4 (L) 3.5 - 5.1 mmol/L   Chloride 106 101 - 111 mmol/L   CO2 24 22 - 32 mmol/L   Glucose, Bld 126 (H) 65 - 99 mg/dL   BUN 16 6 - 20 mg/dL   Creatinine, Ser 0.95 0.44 - 1.00 mg/dL   Calcium 9.2 8.9 - 10.3 mg/dL   Total  Protein 7.2 6.5 - 8.1 g/dL   Albumin 3.8 3.5 - 5.0 g/dL   AST 15 15 - 41 U/L   ALT 9 (L) 14 - 54 U/L   Alkaline Phosphatase 52 38 - 126 U/L   Total Bilirubin 1.1 0.3 - 1.2 mg/dL   GFR calc non Af Amer 53 (L) >60 mL/min   GFR calc Af Amer >60 >60 mL/min    Comment: (NOTE) The eGFR has been calculated using the CKD EPI equation. This calculation has not been validated in all clinical situations. eGFR's persistently <60 mL/min signify possible Chronic Kidney Disease.    Anion gap 9 5 - 15  I-stat troponin, ED (not at [facility])     Status: None   Collection Time: 08/04/15 10:22 AM  Result Value Ref Range   Troponin i, poc 0.01 0.00 - 0.08 ng/mL   Comment 3            Comment: Due to the release kinetics of cTnI, a negative result within the first hours of the onset of symptoms does not rule out myocardial infarction with certainty. If myocardial infarction is still suspected, repeat the test at appropriate intervals.   Urinalysis, Routine w reflex  microscopic (not at [facility])     Status: Abnormal   Collection Time: 08/04/15 12:23 PM  Result Value Ref Range   Color, Urine YELLOW YELLOW   APPearance CLEAR CLEAR   Specific Gravity, Urine 1.010 1.005 - 1.030   pH 6.5 5.0 - 8.0   Glucose, UA NEGATIVE NEGATIVE mg/dL   Hgb urine dipstick NEGATIVE NEGATIVE   Bilirubin Urine NEGATIVE NEGATIVE   Ketones, ur NEGATIVE NEGATIVE mg/dL   Protein, ur NEGATIVE NEGATIVE mg/dL   Nitrite NEGATIVE NEGATIVE   Leukocytes, UA LARGE (A) NEGATIVE  Urine microscopic-add on     Status: Abnormal   Collection Time: 08/04/15 12:23 PM  Result Value Ref Range   Squamous Epithelial / LPF 6-30 (A) NONE SEEN   WBC, UA 6-30 0 - 5 WBC/hpf   RBC / HPF 0-5 0 - 5 RBC/hpf   Bacteria, UA FEW (A) NONE SEEN  Glucose, capillary     Status: Abnormal   Collection Time: 08/04/15  5:36 PM  Result Value Ref Range   Glucose-Capillary 117 (H) 65 - 99 mg/dL   Comment 1 Notify RN    Comment 2 Document in Chart   Glucose, capillary     Status: Abnormal   Collection Time: 08/04/15  8:42 PM  Result Value Ref Range   Glucose-Capillary 107 (H) 65 - 99 mg/dL   Comment 1 Notify RN    Comment 2 Document in Chart   Lipid panel     Status: Abnormal   Collection Time: 08/05/15  7:08 AM  Result Value Ref Range   Cholesterol 154 0 - 200 mg/dL   Triglycerides 80 <150 mg/dL   HDL 34 (L) >40 mg/dL   Total CHOL/HDL Ratio 4.5 RATIO   VLDL 16 0 - 40 mg/dL   LDL Cholesterol 104 (H) 0 - 99 mg/dL    Comment:        Total Cholesterol/HDL:CHD Risk Coronary Heart Disease Risk Table                     Men   Women  1/2 Average Risk   3.4   3.3  Average Risk       5.0   4.4  2 X Average Risk   9.6  7.1  3 X Average Risk  23.4   11.0        Use the calculated Patient Ratio above and the CHD Risk Table to determine the patient's CHD Risk.        ATP III CLASSIFICATION (LDL):  <100     mg/dL   Optimal  100-129  mg/dL   Near or Above                    Optimal  130-159  mg/dL    Borderline  160-189  mg/dL   High  >190     mg/dL   Very High   Glucose, capillary     Status: None   Collection Time: 08/05/15  7:23 AM  Result Value Ref Range   Glucose-Capillary 97 65 - 99 mg/dL   Comment 1 Notify RN    Comment 2 Document in Chart     ABGS No results for input(s): PHART, PO2ART, TCO2, HCO3 in the last 72 hours.  Invalid input(s): PCO2 CULTURES No results found for this or any previous visit (from the past 240 hour(s)). Studies/Results: Ct Head Wo Contrast  08/04/2015  CLINICAL DATA:  Right arm weakness for 2 days, slurred speech today. EXAM: CT HEAD WITHOUT CONTRAST TECHNIQUE: Contiguous axial images were obtained from the base of the skull through the vertex without intravenous contrast. COMPARISON:  None. FINDINGS: Brain: There is mild generalized age related atrophy with commensurate dilatation of the sulci. Ventricles remain normal in size and configuration. Small old infarct noted within the right cerebellum with associated encephalomalacia. There is no mass, hemorrhage, edema or other evidence of acute parenchymal abnormality. Vascular: No hyperdense vessel or unexpected calcification. There are chronic calcified atherosclerotic changes of the large vessels at the skull base. Skull: Negative for fracture or focal lesion. Sinuses/Orbits: No acute findings. Near complete opacification of the upper left maxillary sinus, likely chronic. Remainder the upper paranasal sinuses appear clear. Other: None. IMPRESSION: 1. No acute intracranial findings. No intracranial mass, hemorrhage or edema. 2. Left maxillary sinus disease, of uncertain age, probably chronic. Electronically Signed   By: Franki Cabot M.D.   On: 08/04/2015 11:30   US Carotid Bilateral  08/04/2015  CLINICAL DATA:  Right-sided weakness and slurred speech EXAM: BILATERAL CAROTID DUPLEX ULTRASOUND TECHNIQUE: Pearline Cables scale imaging, color Doppler and duplex ultrasound were performed of bilateral carotid and vertebral  arteries in the neck. COMPARISON:  None. FINDINGS: Criteria: Quantification of carotid stenosis is based on velocity parameters that correlate the residual internal carotid diameter with NASCET-based stenosis levels, using the diameter of the distal internal carotid lumen as the denominator for stenosis measurement. The following peak systolic velocity measurements were obtained: RIGHT ICA:  93 cm/sec CCA:  53 cm/sec SYSTOLIC ICA/CCA RATIO:  1.8 DIASTOLIC ICA/CCA RATIO:  2.1 ECA:  96 cm/sec LEFT ICA:  123 cm/sec CCA:  72 cm/sec SYSTOLIC ICA/CCA RATIO:  1.7 DIASTOLIC ICA/CCA RATIO:  4.3 ECA:  87 cm/sec RIGHT CAROTID ARTERY: There is mild intimal thickening in the distal common carotid artery on the right. There is moderate mixed echogenicity plaque in the bifurcation region and proximal internal carotid artery causing approximately 50% diameter stenosis by real-time evaluation. There is mild plaque at the origin of the right external carotid artery. No obstruction of 50% diameter greater is apparent by real-time interrogation. RIGHT VERTEBRAL ARTERY: Flow in the right vertebral artery is antegrade. LEFT CAROTID ARTERY: There is mild intimal thickening in the common carotid artery. There is mild  plaque formation in the left bifurcation region and proximal internal carotid artery with 20-25% diameter stenosis in these areas. No stenosis approaching 50% diameter is seen by real-time interrogation on the left. LEFT VERTEBRAL ARTERY:  The left vertebral artery flow is antegrade. Incidental note is made of a complex dominant mass arising from the left lobe of the thyroid measuring 3.1 x 1.6 x 2.0 cm. IMPRESSION: There is moderate atherosclerotic plaque in the proximal right internal carotid artery causing approximately 50% diameter stenosis by real-time interrogation. There is only mild atherosclerotic plaque noted on the left. No stenosis of greater than 50% diameter is seen by real-time interrogation on either side.  Waveform evaluation does not show evidence of hemodynamically significant obstructive plaque. Overall, the real-time interrogation waveform evaluation appear commensurate. Flow in both vertebral arteries is in the anatomic direction. Dominant complex mass arising from the left lobe of the thyroid. Advise formal thyroid ultrasound to more precisely evaluate. A nodule of this size may well warrant percutaneous biopsy under imaging guidance. Electronically Signed   By: Lowella Grip III M.D.   On: 08/04/2015 20:00    Medications:  Prior to Admission:  Prescriptions prior to admission  Medication Sig Dispense Refill Last Dose  . albuterol (PROVENTIL HFA;VENTOLIN HFA) 108 (90 Base) MCG/ACT inhaler Inhale 2 puffs into the lungs 4 (four) times daily.   Past Week at Unknown time  . amLODipine-benazepril (LOTREL) 10-20 MG per capsule Take 1 capsule by mouth daily. 90 capsule 1 08/03/2015 at Unknown time  . bimatoprost (LUMIGAN) 0.03 % ophthalmic drops Place 1 drop into both eyes at bedtime.     08/03/2015 at Unknown time  . esomeprazole (NEXIUM) 40 MG capsule Take 40 mg by mouth 2 (two) times daily with a meal.   08/03/2015 at Unknown time  . hydrALAZINE (APRESOLINE) 25 MG tablet Take 25 mg by mouth 3 (three) times daily.    08/03/2015 at Unknown time  . HYDROcodone-acetaminophen (NORCO) 10-325 MG tablet Take 1 tablet by mouth 4 (four) times daily.  0 08/03/2015 at Unknown time  . lidocaine (XYLOCAINE) 5 % ointment Apply 1 application topically daily. To legs and feet   Past Week at Unknown time  . pravastatin (PRAVACHOL) 40 MG tablet Take 40 mg by mouth daily.  0 08/03/2015 at Unknown time  . ALPRAZolam (XANAX) 0.5 MG tablet Take 0.5 mg by mouth 3 (three) times daily as needed for anxiety.    unknown  . dextromethorphan-guaiFENesin (MUCINEX DM) 30-600 MG 12hr tablet Take 1 tablet by mouth 2 (two) times daily as needed for cough.    unknown  . ipratropium-albuterol (DUONEB) 0.5-2.5 (3) MG/3ML SOLN Inhale 3 mLs into  the lungs every 6 (six) hours as needed. Shortness of breath/wheezing   08/02/2015   Scheduled: .  stroke: mapping our early stages of recovery book   Does not apply Once  . amLODipine  10 mg Oral Daily   And  . benazepril  20 mg Oral Daily  . aspirin  300 mg Rectal Daily   Or  . aspirin  325 mg Oral Daily  . enoxaparin (LOVENOX) injection  40 mg Subcutaneous Q24H  . hydrALAZINE  25 mg Oral TID  . insulin aspart  0-9 Units Subcutaneous TID WC  . latanoprost  1 drop Both Eyes QHS  . pantoprazole  40 mg Oral BID AC  . pravastatin  40 mg Oral Daily  . sodium chloride flush  3 mL Intravenous Q12H  . sodium chloride flush  3  mL Intravenous Q12H   Continuous:  HCS:PZZCKI chloride, acetaminophen **OR** acetaminophen, ALPRAZolam, HYDROcodone-acetaminophen, ipratropium-albuterol, polyethylene glycol, sodium chloride flush  Assesment: She was admitted with presumed TIA. So far workup is negative. She is scheduled for MRI. Depending on results of MRI she may be old be discharged home probably tomorrow depending on when that's done Principal Problem:   Slurred speech Active Problems:   Arteriosclerotic cardiovascular disease (ASCVD)   Diabetes mellitus, type II ([place])   TIA (transient ischemic attack)   Accelerated hypertension   Right arm pain    Plan: As above. Continue aspirin      Burns Timson L 08/05/2015, 9:07 AM

## 2015-08-05 NOTE — Progress Notes (Signed)
  Echocardiogram 2D Echocardiogram has been performed.  Sheila Myers 08/05/2015, 3:15 PM

## 2015-08-06 DIAGNOSIS — G459 Transient cerebral ischemic attack, unspecified: Secondary | ICD-10-CM | POA: Diagnosis not present

## 2015-08-06 LAB — GLUCOSE, CAPILLARY
GLUCOSE-CAPILLARY: 104 mg/dL — AB (ref 65–99)
GLUCOSE-CAPILLARY: 114 mg/dL — AB (ref 65–99)

## 2015-08-06 MED ORDER — ASPIRIN 325 MG PO TABS: 325.0000 mg | ORAL_TABLET | Freq: Every day | ORAL | Status: DC

## 2015-08-06 NOTE — Care Management Obs Status (Signed)
MEDICARE OBSERVATION STATUS NOTIFICATION   Patient Details  Name: Sheila AltesQueen E Prevo MRN: [id number] Date of Birth: 04/25/1928   Medicare Observation Status Notification Given:  Yes    Fuller PlanWelborn, Tadarius Maland M, RN 08/06/2015, 10:47 AM

## 2015-08-06 NOTE — Progress Notes (Signed)
No new neurological symptoms. Her MRI did not show evidence of an acute stroke so she is ready for discharge

## 2015-08-06 NOTE — Discharge Summary (Signed)
Physician Discharge Summary  Patient ID: Sheila Myers MRN: [id number] DOB/AGE: 03/28/1928 82 y.o. Primary Care Physician:Maddyn Lieurance L, MD Admit date: 08/04/2015 Discharge date: 08/06/2015    Discharge Diagnoses:   Principal Problem:   Slurred speech Active Problems:   Arteriosclerotic cardiovascular disease (ASCVD)   Diabetes mellitus, type II (HCC)   TIA (transient ischemic attack)   Accelerated hypertension   Right arm pain     Medication List    TAKE these medications        albuterol 108 (90 Base) MCG/ACT inhaler  Commonly known as:  PROVENTIL HFA;VENTOLIN HFA  Inhale 2 puffs into the lungs 4 (four) times daily.     ALPRAZolam 0.5 MG tablet  Commonly known as:  XANAX  Take 0.5 mg by mouth 3 (three) times daily as needed for anxiety.     amLODipine-benazepril 10-20 MG capsule  Commonly known as:  LOTREL  Take 1 capsule by mouth daily.     aspirin 325 MG tablet  Take 1 tablet (325 mg total) by mouth daily.     bimatoprost 0.03 % ophthalmic solution  Commonly known as:  LUMIGAN  Place 1 drop into both eyes at bedtime.     dextromethorphan-guaiFENesin 30-600 MG 12hr tablet  Commonly known as:  MUCINEX DM  Take 1 tablet by mouth 2 (two) times daily as needed for cough.     esomeprazole 40 MG capsule  Commonly known as:  NEXIUM  Take 40 mg by mouth 2 (two) times daily with a meal.     hydrALAZINE 25 MG tablet  Commonly known as:  APRESOLINE  Take 25 mg by mouth 3 (three) times daily.     HYDROcodone-acetaminophen 10-325 MG tablet  Commonly known as:  NORCO  Take 1 tablet by mouth 4 (four) times daily.     ipratropium-albuterol 0.5-2.5 (3) MG/3ML Soln  Commonly known as:  DUONEB  Inhale 3 mLs into the lungs every 6 (six) hours as needed. Shortness of breath/wheezing     lidocaine 5 % ointment  Commonly known as:  XYLOCAINE  Apply 1 application topically daily. To legs and feet     pravastatin 40 MG tablet  Commonly known as:  PRAVACHOL  Take 40  mg by mouth daily.        Discharged Condition:Improved    Consults: None  Significant Diagnostic Studies: Ct Head Wo Contrast  08/04/2015  CLINICAL DATA:  Right arm weakness for 2 days, slurred speech today. EXAM: CT HEAD WITHOUT CONTRAST TECHNIQUE: Contiguous axial images were obtained from the base of the skull through the vertex without intravenous contrast. COMPARISON:  None. FINDINGS: Brain: There is mild generalized age related atrophy with commensurate dilatation of the sulci. Ventricles remain normal in size and configuration. Small old infarct noted within the right cerebellum with associated encephalomalacia. There is no mass, hemorrhage, edema or other evidence of acute parenchymal abnormality. Vascular: No hyperdense vessel or unexpected calcification. There are chronic calcified atherosclerotic changes of the large vessels at the skull base. Skull: Negative for fracture or focal lesion. Sinuses/Orbits: No acute findings. Near complete opacification of the upper left maxillary sinus, likely chronic. Remainder the upper paranasal sinuses appear clear. Other: None. IMPRESSION: 1. No acute intracranial findings. No intracranial mass, hemorrhage or edema. 2. Left maxillary sinus disease, of uncertain age, probably chronic. Electronically Signed   By: Bary Richard M.D.   On: 08/04/2015 11:30   Mr Brain Wo Contrast  08/05/2015  CLINICAL DATA:  Initial evaluation for slurred speech  with right arm weakness. EXAM: MRI HEAD WITHOUT CONTRAST MRA HEAD WITHOUT CONTRAST TECHNIQUE: Multiplanar, multiecho pulse sequences of the brain and surrounding structures were obtained without intravenous contrast. Angiographic images of the head were obtained using MRA technique without contrast. COMPARISON:  Prior CT from 08/04/2015. FINDINGS: MRI HEAD FINDINGS Diffuse prominence of the CSF containing spaces is compatible with generalized age-related cerebral atrophy. Patchy T2/FLAIR hyperintensity within the  periventricular and deep white matter both cerebral hemispheres most consistent with chronic small vessel ischemic disease, mild for age. Chronic small vessel ischemic type changes present within the pons as well. Remote lacunar infarct within the right thalamus. Additional remote infarcts within the bilateral cerebellar hemispheres, right greater than left. No abnormal foci of restricted diffusion to suggest acute intracranial infarct. Gray-white matter differentiation maintained. Major intracranial vascular flow voids are preserved. No acute or chronic intracranial hemorrhage. No mass lesion, midline shift, or mass effect. No hydrocephalus. No extra-axial fluid collection. Major dural sinuses are grossly patent. Craniocervical junction within normal limits. Visualized upper cervical spine unremarkable. Incidental note made of an empty sella. No acute abnormality about the globes and orbits. Patient is status post cataract extraction on the left. Partially visualized left maxillary sinus is opacified. Scattered mucosal thickening within the ethmoidal air cells. Trace opacity left mastoid air cells. Inner ear structures normal. Bone marrow signal intensity within normal limits. No scalp soft tissue abnormality. MRA HEAD FINDINGS ANTERIOR CIRCULATION: Distal cervical segments of the internal carotid arteries are patent with antegrade flow. Left ICA somewhat diminutive as compared to the right. Linear defect through at the petrous-cervical junction bilaterally favored to be artifact on this exam. Scattered atheromatous irregularity within the petrous, cavernous, and supraclinoid ICAs without high-grade flow-limiting stenosis. Right A1 segment widely patent. Left A1 segment hypoplastic and somewhat irregular, accounting for the slightly diminutive left ICA. Anterior communicating artery patent. Multi focal moderate atheromatous stenoses within the anterior cerebral arteries bilaterally, slightly worse on the right. ACA  is are opacified to their distal aspects. M1 segments patent without occlusion. Focal mild stenoses within the distal M1 segments bilaterally (series 108, image 20). MCA bifurcations within normal limits. Moderate proximal M2 stenoses bilaterally (series 108, image 15). MCA branches opacified distally with multi focal atheromatous irregularity. POSTERIOR CIRCULATION: Vertebral arteries code dominant and patent to the vertebrobasilar junction. Posterior inferior cerebral arteries patent bilaterally. Basilar artery mildly irregular but patent to its distal aspect. Superior cerebral arteries patent bilaterally. Fetal type left PCA supplied via the left posterior communicating artery. Left PCA demonstrates multifocal atheromatous irregularity, most evident within the distal left P2 segment were there are moderate to severe stenoses (series 11, image 2). Left PCA is opacified to its distal aspect. Right PCA is markedly attenuated proximally and possibly occluded or near occluded (series 111, image 28). Scant flow seen distally within the right P2 segment. Distal right PCA branches not visualized. No aneurysm or vascular malformation. IMPRESSION: MRI HEAD IMPRESSION: 1. No acute intracranial infarct or other process identified. 2. Remote lacunar infarct within the right thalamus, with additional remote bilateral cerebellar infarcts, right greater than left. 3. Mild for age chronic microvascular ischemic disease. MRA HEAD IMPRESSION: 1. Occlusion/near occlusion of the proximal right P2 segment, with scant thready flow distally. 2. No other large vessel occlusion within the intracranial circulation. 3. Multi focal atheromatous irregularity and stenoses involving the anterior and posterior circulation as above. Electronically Signed   By: Rise MuBenjamin  McClintock M.D.   On: 08/05/2015 19:38   [place] Carotid Bilateral  08/04/2015  CLINICAL DATA:  Right-sided weakness and slurred speech EXAM: BILATERAL CAROTID DUPLEX ULTRASOUND  TECHNIQUE: Wallace Cullens scale imaging, color Doppler and duplex ultrasound were performed of bilateral carotid and vertebral arteries in the neck. COMPARISON:  None. FINDINGS: Criteria: Quantification of carotid stenosis is based on velocity parameters that correlate the residual internal carotid diameter with NASCET-based stenosis levels, using the diameter of the distal internal carotid lumen as the denominator for stenosis measurement. The following peak systolic velocity measurements were obtained: RIGHT ICA:  93 cm/sec CCA:  53 cm/sec SYSTOLIC ICA/CCA RATIO:  1.8 DIASTOLIC ICA/CCA RATIO:  2.1 ECA:  96 cm/sec LEFT ICA:  123 cm/sec CCA:  72 cm/sec SYSTOLIC ICA/CCA RATIO:  1.7 DIASTOLIC ICA/CCA RATIO:  4.3 ECA:  87 cm/sec RIGHT CAROTID ARTERY: There is mild intimal thickening in the distal common carotid artery on the right. There is moderate mixed echogenicity plaque in the bifurcation region and proximal internal carotid artery causing approximately 50% diameter stenosis by real-time evaluation. There is mild plaque at the origin of the right external carotid artery. No obstruction of 50% diameter greater is apparent by real-time interrogation. RIGHT VERTEBRAL ARTERY: Flow in the right vertebral artery is antegrade. LEFT CAROTID ARTERY: There is mild intimal thickening in the common carotid artery. There is mild plaque formation in the left bifurcation region and proximal internal carotid artery with 20-25% diameter stenosis in these areas. No stenosis approaching 50% diameter is seen by real-time interrogation on the left. LEFT VERTEBRAL ARTERY:  The left vertebral artery flow is antegrade. Incidental note is made of a complex dominant mass arising from the left lobe of the thyroid measuring 3.1 x 1.6 x 2.0 cm. IMPRESSION: There is moderate atherosclerotic plaque in the proximal right internal carotid artery causing approximately 50% diameter stenosis by real-time interrogation. There is only mild atherosclerotic  plaque noted on the left. No stenosis of greater than 50% diameter is seen by real-time interrogation on either side. Waveform evaluation does not show evidence of hemodynamically significant obstructive plaque. Overall, the real-time interrogation waveform evaluation appear commensurate. Flow in both vertebral arteries is in the anatomic direction. Dominant complex mass arising from the left lobe of the thyroid. Advise formal thyroid ultrasound to more precisely evaluate. A nodule of this size may well warrant percutaneous biopsy under imaging guidance. Electronically Signed   By: Bretta Bang III M.D.   On: 08/04/2015 20:00   Mr Palma Holter  08/05/2015  CLINICAL DATA:  Initial evaluation for slurred speech with right arm weakness. EXAM: MRI HEAD WITHOUT CONTRAST MRA HEAD WITHOUT CONTRAST TECHNIQUE: Multiplanar, multiecho pulse sequences of the brain and surrounding structures were obtained without intravenous contrast. Angiographic images of the head were obtained using MRA technique without contrast. COMPARISON:  Prior CT from 08/04/2015. FINDINGS: MRI HEAD FINDINGS Diffuse prominence of the CSF containing spaces is compatible with generalized age-related cerebral atrophy. Patchy T2/FLAIR hyperintensity within the periventricular and deep white matter both cerebral hemispheres most consistent with chronic small vessel ischemic disease, mild for age. Chronic small vessel ischemic type changes present within the pons as well. Remote lacunar infarct within the right thalamus. Additional remote infarcts within the bilateral cerebellar hemispheres, right greater than left. No abnormal foci of restricted diffusion to suggest acute intracranial infarct. Gray-white matter differentiation maintained. Major intracranial vascular flow voids are preserved. No acute or chronic intracranial hemorrhage. No mass lesion, midline shift, or mass effect. No hydrocephalus. No extra-axial fluid collection. Major dural sinuses are  grossly patent. Craniocervical junction within normal limits. Visualized  upper cervical spine unremarkable. Incidental note made of an empty sella. No acute abnormality about the globes and orbits. Patient is status post cataract extraction on the left. Partially visualized left maxillary sinus is opacified. Scattered mucosal thickening within the ethmoidal air cells. Trace opacity left mastoid air cells. Inner ear structures normal. Bone marrow signal intensity within normal limits. No scalp soft tissue abnormality. MRA HEAD FINDINGS ANTERIOR CIRCULATION: Distal cervical segments of the internal carotid arteries are patent with antegrade flow. Left ICA somewhat diminutive as compared to the right. Linear defect through at the petrous-cervical junction bilaterally favored to be artifact on this exam. Scattered atheromatous irregularity within the petrous, cavernous, and supraclinoid ICAs without high-grade flow-limiting stenosis. Right A1 segment widely patent. Left A1 segment hypoplastic and somewhat irregular, accounting for the slightly diminutive left ICA. Anterior communicating artery patent. Multi focal moderate atheromatous stenoses within the anterior cerebral arteries bilaterally, slightly worse on the right. ACA is are opacified to their distal aspects. M1 segments patent without occlusion. Focal mild stenoses within the distal M1 segments bilaterally (series 108, image 20). MCA bifurcations within normal limits. Moderate proximal M2 stenoses bilaterally (series 108, image 15). MCA branches opacified distally with multi focal atheromatous irregularity. POSTERIOR CIRCULATION: Vertebral arteries code dominant and patent to the vertebrobasilar junction. Posterior inferior cerebral arteries patent bilaterally. Basilar artery mildly irregular but patent to its distal aspect. Superior cerebral arteries patent bilaterally. Fetal type left PCA supplied via the left posterior communicating artery. Left PCA  demonstrates multifocal atheromatous irregularity, most evident within the distal left P2 segment were there are moderate to severe stenoses (series 11, image 2). Left PCA is opacified to its distal aspect. Right PCA is markedly attenuated proximally and possibly occluded or near occluded (series 111, image 28). Scant flow seen distally within the right P2 segment. Distal right PCA branches not visualized. No aneurysm or vascular malformation. IMPRESSION: MRI HEAD IMPRESSION: 1. No acute intracranial infarct or other process identified. 2. Remote lacunar infarct within the right thalamus, with additional remote bilateral cerebellar infarcts, right greater than left. 3. Mild for age chronic microvascular ischemic disease. MRA HEAD IMPRESSION: 1. Occlusion/near occlusion of the proximal right P2 segment, with scant thready flow distally. 2. No other large vessel occlusion within the intracranial circulation. 3. Multi focal atheromatous irregularity and stenoses involving the anterior and posterior circulation as above. Electronically Signed   By: Rise Mu M.D.   On: 08/05/2015 19:38    Lab Results: Basic Metabolic Panel:  Recent Labs  16/10/96 1000  NA 139  K 3.4*  CL 106  CO2 24  GLUCOSE 126*  BUN 16  CREATININE 0.95  CALCIUM 9.2   Liver Function Tests:  Recent Labs  08/04/15 1000  AST 15  ALT 9*  ALKPHOS 52  BILITOT 1.1  PROT 7.2  ALBUMIN 3.8     CBC:  Recent Labs  08/04/15 1000  WBC 8.4  NEUTROABS 3.8  HGB 13.4  HCT 41.4  MCV 70.9*  PLT 200    No results found for this or any previous visit (from the past 240 hour(s)).   Hospital Course: This is an 82 year old who was in her usual state of poor health at home when she developed right-sided weakness and then slurred speech. She came to my office and I sent her to the emergency department. CT in the emergency department did not show definite stroke. Some of her problem with the weakness of her right side may  be musculoskeletal. By the next  morning her symptoms had resolved. She underwent carotid Doppler MRI/MRA and echocardiogram. She did not show evidence of acute stroke but did show some vascular disease which was intracranial. She was started on aspirin.  Discharge Exam: Blood pressure 155/61, pulse 61, temperature 98.3 F (36.8 C), temperature source Oral, resp. rate 18, height 5\' 3"  (1.6 m), weight 57.834 kg (127 lb 8 oz), SpO2 99 %. She is awake and alert. Neurological examination is normal.  Disposition: Discharge home. We discussed home health she does not want to participate      Discharge Instructions    Discharge patient    Complete by:  As directed              Signed: Delfino Friesen L   08/06/2015, 7:44 AM

## 2015-08-06 NOTE — Progress Notes (Signed)
Pt's IV catheter removed and intact. Pt's IV site clean dry and intact. Discharge instructions including medications and follow up appointments were reviewed and discussed with patient. Pt verbalized understanding of discharge instructions including medications and follow up appointments. All questions were answered and no further questions at this time. Pt in stable condition and in no acute distress at time of discharge. Pt escorted by nurse tech.  

## 2015-08-07 LAB — HEMOGLOBIN A1C
Hgb A1c MFr Bld: 5.9 % — ABNORMAL HIGH (ref 4.8–5.6)
Mean Plasma Glucose: 123 mg/dL

## 2015-08-17 DIAGNOSIS — E119 Type 2 diabetes mellitus without complications: Secondary | ICD-10-CM | POA: Diagnosis not present

## 2015-08-17 DIAGNOSIS — I25119 Atherosclerotic heart disease of native coronary artery with unspecified angina pectoris: Secondary | ICD-10-CM | POA: Diagnosis not present

## 2015-08-17 DIAGNOSIS — J449 Chronic obstructive pulmonary disease, unspecified: Secondary | ICD-10-CM | POA: Diagnosis not present

## 2015-08-17 DIAGNOSIS — M5441 Lumbago with sciatica, right side: Secondary | ICD-10-CM | POA: Diagnosis not present

## 2015-09-01 DIAGNOSIS — J441 Chronic obstructive pulmonary disease with (acute) exacerbation: Secondary | ICD-10-CM | POA: Diagnosis not present

## 2015-09-27 ENCOUNTER — Encounter: Payer: Self-pay | Admitting: Gastroenterology

## 2015-10-02 DIAGNOSIS — J441 Chronic obstructive pulmonary disease with (acute) exacerbation: Secondary | ICD-10-CM | POA: Diagnosis not present

## 2015-10-18 DIAGNOSIS — E119 Type 2 diabetes mellitus without complications: Secondary | ICD-10-CM | POA: Diagnosis not present

## 2015-10-18 DIAGNOSIS — K210 Gastro-esophageal reflux disease with esophagitis: Secondary | ICD-10-CM | POA: Diagnosis not present

## 2015-10-18 DIAGNOSIS — M545 Low back pain: Secondary | ICD-10-CM | POA: Diagnosis not present

## 2015-10-18 DIAGNOSIS — J449 Chronic obstructive pulmonary disease, unspecified: Secondary | ICD-10-CM | POA: Diagnosis not present

## 2015-10-18 DIAGNOSIS — Z23 Encounter for immunization: Secondary | ICD-10-CM | POA: Diagnosis not present

## 2015-10-27 DIAGNOSIS — H1033 Unspecified acute conjunctivitis, bilateral: Secondary | ICD-10-CM | POA: Diagnosis not present

## 2015-12-07 ENCOUNTER — Emergency Department (HOSPITAL_COMMUNITY): Payer: Medicare Other

## 2015-12-07 ENCOUNTER — Observation Stay (HOSPITAL_COMMUNITY)
Admission: EM | Admit: 2015-12-07 | Discharge: 2015-12-09 | Disposition: A | Discharge status: Home / Self Care | Payer: Medicare Other | Attending: Pulmonary Disease | Admitting: Pulmonary Disease

## 2015-12-07 ENCOUNTER — Encounter (HOSPITAL_COMMUNITY): Payer: Self-pay

## 2015-12-07 DIAGNOSIS — E119 Type 2 diabetes mellitus without complications: Secondary | ICD-10-CM | POA: Diagnosis not present

## 2015-12-07 DIAGNOSIS — I679 Cerebrovascular disease, unspecified: Secondary | ICD-10-CM | POA: Diagnosis present

## 2015-12-07 DIAGNOSIS — J449 Chronic obstructive pulmonary disease, unspecified: Secondary | ICD-10-CM | POA: Insufficient documentation

## 2015-12-07 DIAGNOSIS — Z7982 Long term (current) use of aspirin: Secondary | ICD-10-CM | POA: Diagnosis not present

## 2015-12-07 DIAGNOSIS — Z79899 Other long term (current) drug therapy: Secondary | ICD-10-CM | POA: Diagnosis not present

## 2015-12-07 DIAGNOSIS — I10 Essential (primary) hypertension: Secondary | ICD-10-CM | POA: Diagnosis not present

## 2015-12-07 DIAGNOSIS — K219 Gastro-esophageal reflux disease without esophagitis: Secondary | ICD-10-CM | POA: Diagnosis present

## 2015-12-07 DIAGNOSIS — R0789 Other chest pain: Secondary | ICD-10-CM | POA: Diagnosis present

## 2015-12-07 DIAGNOSIS — E785 Hyperlipidemia, unspecified: Secondary | ICD-10-CM | POA: Diagnosis present

## 2015-12-07 DIAGNOSIS — R0602 Shortness of breath: Secondary | ICD-10-CM | POA: Diagnosis not present

## 2015-12-07 DIAGNOSIS — R079 Chest pain, unspecified: Principal | ICD-10-CM

## 2015-12-07 DIAGNOSIS — I159 Secondary hypertension, unspecified: Secondary | ICD-10-CM | POA: Diagnosis present

## 2015-12-07 DIAGNOSIS — J439 Emphysema, unspecified: Secondary | ICD-10-CM | POA: Diagnosis present

## 2015-12-07 LAB — BASIC METABOLIC PANEL
Anion gap: 6 (ref 5–15)
BUN: 13 mg/dL (ref 6–20)
CALCIUM: 9.1 mg/dL (ref 8.9–10.3)
CO2: 29 mmol/L (ref 22–32)
CREATININE: 0.82 mg/dL (ref 0.44–1.00)
Chloride: 106 mmol/L (ref 101–111)
GFR calc non Af Amer: >60 mL/min (ref >60)
Glucose, Bld: 136 mg/dL — ABNORMAL HIGH (ref 65–99)
Potassium: 3.1 mmol/L — ABNORMAL LOW (ref 3.5–5.1)
SODIUM: 141 mmol/L (ref 135–145)

## 2015-12-07 LAB — CBC WITH DIFFERENTIAL/PLATELET
BASOS PCT: 0 %
Basophils Absolute: 0 10*3/uL (ref 0.0–0.1)
EOS ABS: 0.4 10*3/uL (ref 0.0–0.7)
EOS PCT: 5 %
HCT: 41 % (ref 36.0–46.0)
Hemoglobin: 13.2 g/dL (ref 12.0–15.0)
LYMPHS ABS: 2.7 10*3/uL (ref 0.7–4.0)
Lymphocytes Relative: 32 %
MCH: 23.1 pg — AB (ref 26.0–34.0)
MCHC: 32.2 g/dL (ref 30.0–36.0)
MCV: 71.8 fL — ABNORMAL LOW (ref 78.0–100.0)
MONO ABS: 0.6 10*3/uL (ref 0.1–1.0)
MONOS PCT: 7 %
NEUTROS PCT: 56 %
Neutro Abs: 4.8 10*3/uL (ref 1.7–7.7)
PLATELETS: 233 10*3/uL (ref 150–400)
RBC: 5.71 MIL/uL — ABNORMAL HIGH (ref 3.87–5.11)
RDW: 16 % — AB (ref 11.5–15.5)
WBC: 8.5 10*3/uL (ref 4.0–10.5)

## 2015-12-07 LAB — TROPONIN I
TROPONIN I: 0.03 ng/mL — AB (ref <0.03)
TROPONIN I: 0.04 ng/mL — AB (ref <0.03)
Troponin I: 0.04 ng/mL (ref <0.03)
Troponin I: 0.03 ng/mL (ref <0.03)

## 2015-12-07 LAB — COOXEMETRY PANEL
Carboxyhemoglobin: 1.3 % (ref 0.5–1.5)
METHEMOGLOBIN: 0.8 % (ref 0.0–1.5)
O2 Saturation: 96.7 %
TOTAL HEMOGLOBIN: 12.7 g/dL (ref 12.0–16.0)
Total oxygen content: 17 mL/dL (ref 15.0–23.0)

## 2015-12-07 MED ORDER — NITROGLYCERIN 2 % TD OINT
1.0000 [in_us] | TOPICAL_OINTMENT | Freq: Once | TRANSDERMAL | Status: AC
  Administered 2015-12-07: 1 [in_us] via TOPICAL
  Filled 2015-12-07: qty 1

## 2015-12-07 MED ORDER — IPRATROPIUM-ALBUTEROL 0.5-2.5 (3) MG/3ML IN SOLN
3.0000 mL | Freq: Four times a day (QID) | RESPIRATORY_TRACT | Status: DC | PRN
Start: 2015-12-07 — End: 2015-12-09

## 2015-12-07 MED ORDER — MECLIZINE HCL 12.5 MG PO TABS: 25.0000 mg | ORAL_TABLET | Freq: Every day | ORAL | Status: DC | PRN

## 2015-12-07 MED ORDER — ONDANSETRON HCL 4 MG/2ML IJ SOLN: 4.0000 mg | Freq: Four times a day (QID) | INTRAMUSCULAR | Status: DC | PRN

## 2015-12-07 MED ORDER — ASPIRIN 325 MG PO TABS
325.0000 mg | ORAL_TABLET | Freq: Every day | ORAL | Status: DC
  Administered 2015-12-08 – 2015-12-09 (×2): 325 mg via ORAL
  Filled 2015-12-07 (×3): qty 1

## 2015-12-07 MED ORDER — DM-GUAIFENESIN ER 30-600 MG PO TB12: 1.0000 | ORAL_TABLET | Freq: Two times a day (BID) | ORAL | Status: DC | PRN

## 2015-12-07 MED ORDER — IPRATROPIUM-ALBUTEROL 0.5-2.5 (3) MG/3ML IN SOLN
3.0000 mL | Freq: Once | RESPIRATORY_TRACT | Status: AC
  Administered 2015-12-07: 3 mL via RESPIRATORY_TRACT
  Filled 2015-12-07: qty 3

## 2015-12-07 MED ORDER — LATANOPROST 0.005 % OP SOLN
1.0000 [drp] | Freq: Every day | OPHTHALMIC | Status: DC
  Administered 2015-12-07 – 2015-12-08 (×2): 1 [drp] via OPHTHALMIC
  Filled 2015-12-07: qty 2.5

## 2015-12-07 MED ORDER — AMLODIPINE BESYLATE 5 MG PO TABS
10.0000 mg | ORAL_TABLET | Freq: Every day | ORAL | Status: DC
  Administered 2015-12-08 – 2015-12-09 (×2): 10 mg via ORAL
  Filled 2015-12-07 (×3): qty 2

## 2015-12-07 MED ORDER — ONDANSETRON 4 MG PO TBDP
4.0000 mg | ORAL_TABLET | Freq: Once | ORAL | Status: AC
  Administered 2015-12-07: 4 mg via ORAL
  Filled 2015-12-07: qty 1

## 2015-12-07 MED ORDER — HYDRALAZINE HCL 25 MG PO TABS
25.0000 mg | ORAL_TABLET | Freq: Three times a day (TID) | ORAL | Status: DC
  Administered 2015-12-07 – 2015-12-09 (×6): 25 mg via ORAL
  Filled 2015-12-07 (×7): qty 1

## 2015-12-07 MED ORDER — ACETAMINOPHEN 325 MG PO TABS
650.0000 mg | ORAL_TABLET | ORAL | Status: DC | PRN
  Administered 2015-12-08: 650 mg via ORAL
  Filled 2015-12-07: qty 2

## 2015-12-07 MED ORDER — ALBUTEROL SULFATE (2.5 MG/3ML) 0.083% IN NEBU
2.5000 mg | INHALATION_SOLUTION | Freq: Four times a day (QID) | RESPIRATORY_TRACT | Status: DC
  Administered 2015-12-07 – 2015-12-08 (×4): 2.5 mg via RESPIRATORY_TRACT
  Filled 2015-12-07 (×6): qty 3

## 2015-12-07 MED ORDER — NITROGLYCERIN 0.4 MG SL SUBL: 0.4000 mg | SUBLINGUAL_TABLET | SUBLINGUAL | Status: DC | PRN

## 2015-12-07 MED ORDER — ALPRAZOLAM 0.5 MG PO TABS
0.5000 mg | ORAL_TABLET | Freq: Three times a day (TID) | ORAL | Status: DC | PRN
  Administered 2015-12-08 – 2015-12-09 (×4): 0.5 mg via ORAL
  Filled 2015-12-07 (×4): qty 1

## 2015-12-07 MED ORDER — POTASSIUM CHLORIDE CRYS ER 20 MEQ PO TBCR
40.0000 meq | EXTENDED_RELEASE_TABLET | Freq: Once | ORAL | Status: AC
  Administered 2015-12-07: 40 meq via ORAL
  Filled 2015-12-07: qty 2

## 2015-12-07 MED ORDER — PRAVASTATIN SODIUM 40 MG PO TABS
40.0000 mg | ORAL_TABLET | Freq: Every day | ORAL | Status: DC
  Administered 2015-12-08 – 2015-12-09 (×2): 40 mg via ORAL
  Filled 2015-12-07 (×3): qty 1

## 2015-12-07 MED ORDER — BENAZEPRIL HCL 10 MG PO TABS
20.0000 mg | ORAL_TABLET | Freq: Every day | ORAL | Status: DC
  Administered 2015-12-08 – 2015-12-09 (×2): 20 mg via ORAL
  Filled 2015-12-07 (×3): qty 2

## 2015-12-07 MED ORDER — ENOXAPARIN SODIUM 40 MG/0.4ML ~~LOC~~ SOLN
40.0000 mg | SUBCUTANEOUS | Status: DC
  Administered 2015-12-07 – 2015-12-08 (×2): 40 mg via SUBCUTANEOUS
  Filled 2015-12-07 (×2): qty 0.4

## 2015-12-07 MED ORDER — ACETAMINOPHEN 500 MG PO TABS
1000.0000 mg | ORAL_TABLET | Freq: Once | ORAL | Status: AC
  Administered 2015-12-07: 1000 mg via ORAL
  Filled 2015-12-07: qty 2

## 2015-12-07 MED ORDER — ASPIRIN 325 MG PO TABS
325.0000 mg | ORAL_TABLET | Freq: Once | ORAL | Status: AC
  Administered 2015-12-07: 325 mg via ORAL
  Filled 2015-12-07: qty 1

## 2015-12-07 MED ORDER — HYDROCODONE-ACETAMINOPHEN 10-325 MG PO TABS
1.0000 | ORAL_TABLET | Freq: Four times a day (QID) | ORAL | Status: DC
  Administered 2015-12-07 – 2015-12-09 (×7): 1 via ORAL
  Filled 2015-12-07 (×8): qty 1
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
## 2015-12-07 NOTE — ED Provider Notes (Signed)
Patient's second troponin was elevated at 0.04. She will be admitted to medicine and cardiology will consult   Bethann BerkshireJoseph Marguarite Markov, MD 12/07/15 1124
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
## 2015-12-07 NOTE — ED Notes (Signed)
Pt son, Leonette MostCharles, notified pt will be admitted.
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
## 2015-12-07 NOTE — ED Notes (Signed)
Pt's son, Sheila Myers, can be reached at [phone].

## 2015-12-07 NOTE — ED Notes (Signed)
[place], [phone]. Call with disposition .

## 2015-12-08 ENCOUNTER — Encounter (HOSPITAL_COMMUNITY): Payer: Medicare Other

## 2015-12-08 ENCOUNTER — Observation Stay (HOSPITAL_BASED_OUTPATIENT_CLINIC_OR_DEPARTMENT_OTHER): Payer: Medicare Other

## 2015-12-08 DIAGNOSIS — R0602 Shortness of breath: Secondary | ICD-10-CM | POA: Diagnosis not present

## 2015-12-08 DIAGNOSIS — R079 Chest pain, unspecified: Secondary | ICD-10-CM | POA: Diagnosis not present

## 2015-12-08 DIAGNOSIS — E119 Type 2 diabetes mellitus without complications: Secondary | ICD-10-CM | POA: Diagnosis not present

## 2015-12-08 DIAGNOSIS — J449 Chronic obstructive pulmonary disease, unspecified: Secondary | ICD-10-CM | POA: Diagnosis not present

## 2015-12-08 LAB — NM MYOCAR MULTI W/SPECT W/WALL MOTION / EF
CHL CUP NUCLEAR SSS: 1
CHL CUP RESTING HR STRESS: 66 {beats}/min
LHR: 0
LVDIAVOL: 57 mL (ref 46–106)
LVSYSVOL: 19 mL
NUC STRESS TID: 1.17
Peak HR: 96 {beats}/min
SDS: 0
SRS: 1

## 2015-12-08 MED ORDER — REGADENOSON 0.4 MG/5ML IV SOLN
INTRAVENOUS | Status: AC
  Administered 2015-12-08: 0.4 mg via INTRAVENOUS
  Filled 2015-12-08: qty 5

## 2015-12-08 MED ORDER — ALBUTEROL SULFATE (2.5 MG/3ML) 0.083% IN NEBU
2.5000 mg | INHALATION_SOLUTION | Freq: Three times a day (TID) | RESPIRATORY_TRACT | Status: DC
  Administered 2015-12-09: 2.5 mg via RESPIRATORY_TRACT
  Filled 2015-12-08: qty 3

## 2015-12-08 MED ORDER — REGADENOSON 0.4 MG/5ML IV SOLN
0.4000 mg | Freq: Once | INTRAVENOUS | Status: AC
  Administered 2015-12-08: 0.4 mg via INTRAVENOUS
  Filled 2015-12-08: qty 5

## 2015-12-08 MED ORDER — SODIUM CHLORIDE 0.9% FLUSH
INTRAVENOUS | Status: AC
  Administered 2015-12-08: 10 mL via INTRAVENOUS
  Filled 2015-12-08: qty 10

## 2015-12-08 MED ORDER — MAGNESIUM HYDROXIDE 400 MG/5ML PO SUSP
30.0000 mL | Freq: Every day | ORAL | Status: DC | PRN
  Administered 2015-12-08: 30 mL via ORAL
  Filled 2015-12-08: qty 30

## 2015-12-08 MED ORDER — TECHNETIUM TC 99M TETROFOSMIN IV KIT
30.0000 | PACK | Freq: Once | INTRAVENOUS | Status: AC | PRN
  Administered 2015-12-08: 30 via INTRAVENOUS

## 2015-12-08 MED ORDER — TECHNETIUM TC 99M TETROFOSMIN IV KIT
10.0000 | PACK | Freq: Once | INTRAVENOUS | Status: AC | PRN
  Administered 2015-12-08: 10 via INTRAVENOUS

## 2015-12-08 NOTE — Care Management Obs Status (Signed)
MEDICARE OBSERVATION STATUS NOTIFICATION   Patient Details  Name: Sheila Myers MRN: [id number] Date of Birth: September 17, 1928   Medicare Observation Status Notification Given:  Yes    Quamel Fitzmaurice, Chrystine OilerSharley Diane, RN 12/08/2015, 12:30 PM
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
## 2015-12-09 DIAGNOSIS — R079 Chest pain, unspecified: Secondary | ICD-10-CM | POA: Diagnosis not present

## 2015-12-09 NOTE — Progress Notes (Signed)
Pt's IV catheter removed and intact. Pt's IV site clean dry and intact. Discharge instructions including medications and follow up appointments were reviewed and discussed with patient. All questions were answered and no further questions at this time. Pt verbalized understanding of discharge instructions including medications. Pt in stable condition and in no acute distress at time of discharge. Pt escorted by nurse tech.
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
## 2015-12-09 NOTE — Progress Notes (Signed)
Subjective: She feels better. No complaints. No further chest pain. She is still anxious. Her breathing is pretty good. No nausea vomiting diarrhea  Objective: Vital signs in last 24 hours: Temp:  [97.4 F (36.3 C)-98.4 F (36.9 C)] 98.4 F (36.9 C) (11/11 0543) Pulse Rate:  [62-74] 69 (11/11 0543) Resp:  [16-18] 16 (11/11 0543) BP: (141-185)/(58-64) 142/64 (11/11 0543) SpO2:  [92 %-100 %] 93 % (11/11 0737) Weight change:  Last BM Date: 12/04/15  Intake/Output from previous day: No intake/output data recorded.  PHYSICAL EXAM General appearance: alert, cooperative and no distress Resp: clear to auscultation bilaterally Cardio: regular rate and rhythm, S1, S2 normal, no murmur, click, rub or gallop GI: soft, non-tender; bowel sounds normal; no masses,  no organomegaly Extremities: extremities normal, atraumatic, no cyanosis or edema Skin is warm and dry. Mucous membranes are moist  Lab Results:  Results for orders placed or performed during the hospital encounter of 12/07/15 (from the past 48 hour(s))  Troponin I-serum (0, 3, 6 hours)     Status: Abnormal   Collection Time: 12/07/15  2:51 PM  Result Value Ref Range   Troponin I 0.04 (HH) <0.03 ng/mL    Comment: CRITICAL VALUE NOTED.  VALUE IS CONSISTENT WITH PREVIOUSLY REPORTED AND CALLED VALUE.  Troponin I-serum (0, 3, 6 hours)     Status: Abnormal   Collection Time: 12/07/15  8:55 PM  Result Value Ref Range   Troponin I 0.03 (HH) <0.03 ng/mL    Comment: CRITICAL VALUE NOTED.  VALUE IS CONSISTENT WITH PREVIOUSLY REPORTED AND CALLED VALUE.    ABGS No results for input(s): PHART, PO2ART, TCO2, HCO3 in the last 72 hours.  Invalid input(s): PCO2 CULTURES No results found for this or any previous visit (from the past 240 hour(s)). Studies/Results: Nm Myocar Multi W/spect W/wall Motion / Ef  Result Date: 12/08/2015  There was no ST segment deviation noted during stress.  The study is normal.  This is a low risk study.   The left ventricular ejection fraction is hyperdynamic (>65%).     Medications:  Prior to Admission:  Prescriptions Prior to Admission  Medication Sig Dispense Refill Last Dose  . albuterol (PROVENTIL HFA;VENTOLIN HFA) 108 (90 Base) MCG/ACT inhaler Inhale 2 puffs into the lungs 4 (four) times daily.   Past Week at Unknown time  . ALPRAZolam (XANAX) 0.5 MG tablet Take 0.5 mg by mouth 3 (three) times daily as needed for anxiety.    Past Week at Unknown time  . amLODipine-benazepril (LOTREL) 10-20 MG per capsule Take 1 capsule by mouth daily. 90 capsule 1 12/06/2015 at Unknown time  . aspirin 325 MG tablet Take 1 tablet (325 mg total) by mouth daily. 30 tablet 12 12/06/2015 at Unknown time  . bimatoprost (LUMIGAN) 0.03 % ophthalmic drops Place 1 drop into both eyes at bedtime.     12/06/2015 at Unknown time  . dextromethorphan-guaiFENesin (MUCINEX DM) 30-600 MG 12hr tablet Take 1 tablet by mouth 2 (two) times daily as needed for cough.    12/06/2015 at Unknown time  . hydrALAZINE (APRESOLINE) 25 MG tablet Take 25 mg by mouth 3 (three) times daily.    12/06/2015 at Unknown time  . HYDROcodone-acetaminophen (NORCO) 10-325 MG tablet Take 1 tablet by mouth 4 (four) times daily.  0 Past Month at Unknown time  . ipratropium-albuterol (DUONEB) 0.5-2.5 (3) MG/3ML SOLN Inhale 3 mLs into the lungs every 6 (six) hours as needed. Shortness of breath/wheezing   Past Week at Unknown time  .  lidocaine (XYLOCAINE) 5 % ointment Apply 1 application topically daily. To legs and feet   12/06/2015 at Unknown time  . meclizine (ANTIVERT) 25 MG tablet 1 tablet daily as needed.   Past Week at Unknown time  . pravastatin (PRAVACHOL) 40 MG tablet Take 40 mg by mouth daily.  0 12/06/2015 at Unknown time   Scheduled: . albuterol  2.5 mg Inhalation TID  . amLODipine  10 mg Oral Daily  . aspirin  325 mg Oral Daily  . benazepril  20 mg Oral Daily  . enoxaparin (LOVENOX) injection  40 mg Subcutaneous Q24H  . hydrALAZINE  25 mg  Oral TID  . HYDROcodone-acetaminophen  1 tablet Oral QID  . latanoprost  1 drop Both Eyes QHS  . pravastatin  40 mg Oral Daily   Continuous:  ZOX:[id number]:acetaminophen, ALPRAZolam, dextromethorphan-guaiFENesin, ipratropium-albuterol, magnesium hydroxide, meclizine, nitroGLYCERIN, ondansetron (ZOFRAN) IV  Assesment: She was admitted with chest pain at rest. She had stress test done yesterday that resulted late. He is however essentially normal and low risk. She is ready for discharge. Principal Problem:   Chest pain at rest Active Problems:   Pulmonary emphysema (HCC)   Gastroesophageal reflux disease   Diabetes mellitus, type II (HCC)   Hypertension   Hyperlipidemia   Cerebrovascular disease   Chest pain    Plan: Discharge home    LOS: 0 days   Amedio Bowlby L 12/09/2015, 10:40 AM

## 2016-02-01 DIAGNOSIS — J441 Chronic obstructive pulmonary disease with (acute) exacerbation: Secondary | ICD-10-CM | POA: Diagnosis not present

## 2016-03-03 DIAGNOSIS — J441 Chronic obstructive pulmonary disease with (acute) exacerbation: Secondary | ICD-10-CM | POA: Diagnosis not present

## 2016-03-19 DIAGNOSIS — I10 Essential (primary) hypertension: Secondary | ICD-10-CM | POA: Diagnosis not present

## 2016-03-19 DIAGNOSIS — J449 Chronic obstructive pulmonary disease, unspecified: Secondary | ICD-10-CM | POA: Diagnosis not present

## 2016-03-19 DIAGNOSIS — I25119 Atherosclerotic heart disease of native coronary artery with unspecified angina pectoris: Secondary | ICD-10-CM | POA: Diagnosis not present

## 2016-03-19 DIAGNOSIS — J441 Chronic obstructive pulmonary disease with (acute) exacerbation: Secondary | ICD-10-CM | POA: Diagnosis not present

## 2016-03-19 DIAGNOSIS — M545 Low back pain: Secondary | ICD-10-CM | POA: Diagnosis not present

## 2016-03-31 DIAGNOSIS — J441 Chronic obstructive pulmonary disease with (acute) exacerbation: Secondary | ICD-10-CM | POA: Diagnosis not present

## 2016-04-16 ENCOUNTER — Emergency Department (HOSPITAL_COMMUNITY): Payer: Medicare Other

## 2016-04-16 ENCOUNTER — Encounter (HOSPITAL_COMMUNITY): Payer: Self-pay | Admitting: Emergency Medicine

## 2016-04-16 ENCOUNTER — Emergency Department (HOSPITAL_COMMUNITY)
Admission: EM | Admit: 2016-04-16 | Discharge: 2016-04-16 | Disposition: A | Discharge status: Home / Self Care | Payer: Medicare Other | Attending: Emergency Medicine | Admitting: Emergency Medicine

## 2016-04-16 DIAGNOSIS — I10 Essential (primary) hypertension: Secondary | ICD-10-CM | POA: Diagnosis not present

## 2016-04-16 DIAGNOSIS — I700 Atherosclerosis of aorta: Secondary | ICD-10-CM | POA: Diagnosis not present

## 2016-04-16 DIAGNOSIS — Z0389 Encounter for observation for other suspected diseases and conditions ruled out: Secondary | ICD-10-CM | POA: Diagnosis not present

## 2016-04-16 DIAGNOSIS — E119 Type 2 diabetes mellitus without complications: Secondary | ICD-10-CM | POA: Diagnosis not present

## 2016-04-16 DIAGNOSIS — M4856XA Collapsed vertebra, not elsewhere classified, lumbar region, initial encounter for fracture: Secondary | ICD-10-CM | POA: Insufficient documentation

## 2016-04-16 DIAGNOSIS — M545 Low back pain: Secondary | ICD-10-CM | POA: Diagnosis present

## 2016-04-16 DIAGNOSIS — Z7982 Long term (current) use of aspirin: Secondary | ICD-10-CM | POA: Insufficient documentation

## 2016-04-16 DIAGNOSIS — M549 Dorsalgia, unspecified: Secondary | ICD-10-CM | POA: Diagnosis not present

## 2016-04-16 DIAGNOSIS — J449 Chronic obstructive pulmonary disease, unspecified: Secondary | ICD-10-CM | POA: Insufficient documentation

## 2016-04-16 DIAGNOSIS — Z79899 Other long term (current) drug therapy: Secondary | ICD-10-CM | POA: Diagnosis not present

## 2016-04-16 DIAGNOSIS — S32010A Wedge compression fracture of first lumbar vertebra, initial encounter for closed fracture: Secondary | ICD-10-CM | POA: Diagnosis not present

## 2016-04-16 LAB — COMPREHENSIVE METABOLIC PANEL
ALT: 17 U/L (ref 14–54)
ANION GAP: 7 (ref 5–15)
AST: 18 U/L (ref 15–41)
Albumin: 3.8 g/dL (ref 3.5–5.0)
Alkaline Phosphatase: 58 U/L (ref 38–126)
BILIRUBIN TOTAL: 1.4 mg/dL — AB (ref 0.3–1.2)
BUN: 13 mg/dL (ref 6–20)
CO2: 31 mmol/L (ref 22–32)
Calcium: 9.4 mg/dL (ref 8.9–10.3)
Chloride: 99 mmol/L — ABNORMAL LOW (ref 101–111)
Creatinine, Ser: 0.85 mg/dL (ref 0.44–1.00)
GFR calc non Af Amer: >60 mL/min (ref >60)
Glucose, Bld: 133 mg/dL — ABNORMAL HIGH (ref 65–99)
POTASSIUM: 3.2 mmol/L — AB (ref 3.5–5.1)
Sodium: 137 mmol/L (ref 135–145)
TOTAL PROTEIN: 7.3 g/dL (ref 6.5–8.1)

## 2016-04-16 LAB — URINALYSIS, ROUTINE W REFLEX MICROSCOPIC
BACTERIA UA: NONE SEEN
BILIRUBIN URINE: NEGATIVE
Glucose, UA: NEGATIVE mg/dL
Hgb urine dipstick: NEGATIVE
KETONES UR: NEGATIVE mg/dL
NITRITE: NEGATIVE
PH: 7 (ref 5.0–8.0)
PROTEIN: NEGATIVE mg/dL
Specific Gravity, Urine: 1.012 (ref 1.005–1.030)

## 2016-04-16 LAB — CBC
HEMATOCRIT: 43.5 % (ref 36.0–46.0)
HEMOGLOBIN: 14.3 g/dL (ref 12.0–15.0)
MCH: 23.3 pg — ABNORMAL LOW (ref 26.0–34.0)
MCHC: 32.9 g/dL (ref 30.0–36.0)
MCV: 70.8 fL — AB (ref 78.0–100.0)
Platelets: 215 10*3/uL (ref 150–400)
RBC: 6.14 MIL/uL — AB (ref 3.87–5.11)
RDW: 16.9 % — ABNORMAL HIGH (ref 11.5–15.5)
WBC: 8.8 10*3/uL (ref 4.0–10.5)

## 2016-04-16 LAB — TROPONIN I: Troponin I: <0.03 ng/mL (ref <0.03)

## 2016-04-16 LAB — LIPASE, BLOOD: LIPASE: 17 U/L (ref 11–51)

## 2016-04-16 MED ORDER — FENTANYL CITRATE (PF) 100 MCG/2ML IJ SOLN
25.0000 ug | Freq: Once | INTRAMUSCULAR | Status: AC
  Administered 2016-04-16: 25 ug via INTRAVENOUS
  Filled 2016-04-16: qty 2

## 2016-04-16 MED ORDER — ONDANSETRON HCL 4 MG/2ML IJ SOLN
4.0000 mg | Freq: Once | INTRAMUSCULAR | Status: AC
  Administered 2016-04-16: 4 mg via INTRAVENOUS
  Filled 2016-04-16: qty 2

## 2016-04-16 MED ORDER — FENTANYL CITRATE (PF) 100 MCG/2ML IJ SOLN
100.0000 ug | Freq: Once | INTRAMUSCULAR | Status: AC
  Administered 2016-04-16: 100 ug via INTRAVENOUS
  Filled 2016-04-16: qty 2

## 2016-04-16 MED ORDER — HYDROCODONE-ACETAMINOPHEN 5-325 MG PO TABS: 1.0000 | ORAL_TABLET | ORAL | 0 refills | Status: DC | PRN

## 2016-04-16 NOTE — ED Notes (Signed)
Called for TLSO brace 

## 2016-04-16 NOTE — ED Notes (Signed)
Called [facility] for a TLSO.  Tech returned call and received all needed info.  States it will be about 1 1/2 hours or so until he gets here with it.  Nurse informed.

## 2016-04-16 NOTE — ED Provider Notes (Signed)
[facility]-EM[facility]Y DEPT Provider Note   CSN: [id number] Arrival date & time: 04/16/16  1429     History   Chief Complaint Chief Complaint  Patient presents with  . Abdominal Pain    HPI Sheila Myers is a 83 y.o. female.  Pt presents to the ED today with low back pain.  The pt said that it hurts to move.  She denies any falls.  The pain does come around a little to her abdomen.      Past Medical History:  Diagnosis Date  . ASCVD (arteriosclerotic cardiovascular disease)    H/o PCI  . Chronic obstructive pulmonary disease (HCC)   . Degenerative joint disease    Right shoulder pain-mechanism unclear  . Depression   . Diabetes mellitus type II   . Gastroesophageal reflux disease    Diverticulosis, hemorrhoids with rectal bleeding,  . Glaucoma   . Hyperlipidemia   . Hypertension   . Irritable bowel syndrome    constipation alternating with diarrhea    Patient Active Problem List   Diagnosis Date Noted  . Chest pain 12/07/2015  . Chest pain at rest 12/07/2015  . TIA (transient ischemic attack) 08/04/2015  . Slurred speech 08/04/2015  . Accelerated hypertension 08/04/2015  . Right arm pain 08/04/2015  . Cerebrovascular disease 09/28/2011  . Thyroid nodule 09/28/2011  . Irritable bowel syndrome   . Gastroesophageal reflux disease   . Arteriosclerotic cardiovascular disease (ASCVD)   . Diabetes mellitus, type II (HCC)   . Hypertension   . Hyperlipidemia   . Pulmonary emphysema (HCC) 05/10/2008  . BACK PAIN, CHRONIC 05/10/2008    Past Surgical History:  Procedure Laterality Date  . ABDOMINAL HYSTERECTOMY    . APPENDECTOMY    . CHOLECYSTECTOMY    . COLONOSCOPY  2007   Negative screening study except for diverticulosis    OB History    No data available       Home Medications    Prior to Admission medications   Medication Sig Start Date End Date Taking? Authorizing Provider  albuterol (PROVENTIL HFA;VENTOLIN HFA) 108 (90 Base) MCG/ACT inhaler  Inhale 2 puffs into the lungs 4 (four) times daily.    Historical Provider, MD  ALPRAZolam Prudy Feeler(XANAX) 0.5 MG tablet Take 0.5 mg by mouth 3 (three) times daily as needed for anxiety.     Historical Provider, MD  amLODipine-benazepril (LOTREL) 10-20 MG per capsule Take 1 capsule by mouth daily. 07/16/12   Kathlen Brunswickobert M Rothbart, MD  aspirin 325 MG tablet Take 1 tablet (325 mg total) by mouth daily. 08/06/15   Kari BaarsEdward Hawkins, MD  bimatoprost (LUMIGAN) 0.03 % ophthalmic drops Place 1 drop into both eyes at bedtime.      Historical Provider, MD  dextromethorphan-guaiFENesin (MUCINEX DM) 30-600 MG 12hr tablet Take 1 tablet by mouth 2 (two) times daily as needed for cough.     Historical Provider, MD  hydrALAZINE (APRESOLINE) 25 MG tablet Take 25 mg by mouth 3 (three) times daily.  08/29/13   Historical Provider, MD  HYDROcodone-acetaminophen (NORCO/VICODIN) 5-325 MG tablet Take 1 tablet by mouth every 4 (four) hours as needed. 04/16/16   Jacalyn LefevreJulie Aveion Nguyen, MD  ipratropium-albuterol (DUONEB) 0.5-2.5 (3) MG/3ML SOLN Inhale 3 mLs into the lungs every 6 (six) hours as needed. Shortness of breath/wheezing 07/31/15   Historical Provider, MD  lidocaine (XYLOCAINE) 5 % ointment Apply 1 application topically daily. To legs and feet 07/24/15   Historical Provider, MD  meclizine (ANTIVERT) 25 MG tablet 1 tablet daily as  needed. 10/18/15   Historical Provider, MD  pravastatin (PRAVACHOL) 40 MG tablet Take 40 mg by mouth daily. 06/30/15   Historical Provider, MD    Family History Family History  Problem Relation Age of Onset  . Aneurysm Mother     Cerebral  . Hypertension Mother     Social History Social History  Substance Use Topics  . Smoking status: Never Smoker  . Smokeless tobacco: Never Used  . Alcohol use No     Allergies   Peanut-containing drug products; Catapres [clonidine hcl]; Codeine; and Morphine   Review of Systems Review of Systems  Musculoskeletal: Positive for back pain.  All other systems reviewed and  are negative.    Physical Exam Updated Vital Signs BP (!) 159/67   Pulse 76   Temp 97.7 F (36.5 C) (Oral)   Resp 18   Ht 5\' 3"  (1.6 m)   Wt 128 lb (58.1 kg)   SpO2 99%   BMI 22.67 kg/m   Physical Exam  Constitutional: She is oriented to person, place, and time. She appears well-developed and well-nourished.  HENT:  Head: Normocephalic and atraumatic.  Right Ear: External ear normal.  Left Ear: External ear normal.  Nose: Nose normal.  Mouth/Throat: Oropharynx is clear and moist.  Eyes: Conjunctivae and EOM are normal. Pupils are equal, round, and reactive to light.  Neck: Normal range of motion. Neck supple.  Cardiovascular: Normal rate, regular rhythm, normal heart sounds and intact distal pulses.   Pulmonary/Chest: Effort normal and breath sounds normal.  Abdominal: Soft. Bowel sounds are normal.  Musculoskeletal:       Lumbar back: She exhibits tenderness.  Neurological: She is alert and oriented to person, place, and time.  Skin: Skin is warm.  Psychiatric: She has a normal mood and affect. Her behavior is normal. Judgment and thought content normal.  Nursing note and vitals reviewed.    ED Treatments / Results  Labs (all labs ordered are listed, but only abnormal results are displayed) Labs Reviewed  COMPREHENSIVE METABOLIC PANEL - Abnormal; Notable for the following:       Result Value   Potassium 3.2 (*)    Chloride 99 (*)    Glucose, Bld 133 (*)    Total Bilirubin 1.4 (*)    All other components within normal limits  CBC - Abnormal; Notable for the following:    RBC 6.14 (*)    MCV 70.8 (*)    MCH 23.3 (*)    RDW 16.9 (*)    All other components within normal limits  URINALYSIS, ROUTINE W REFLEX MICROSCOPIC - Abnormal; Notable for the following:    Leukocytes, UA TRACE (*)    All other components within normal limits  LIPASE, BLOOD  TROPONIN I    EKG  EKG Interpretation  Date/Time:  Tuesday April 16 2016 17:07:38 EDT Ventricular Rate:   75 PR Interval:    QRS Duration: 106 QT Interval:  407 QTC Calculation: 455 R Axis:   33 Text Interpretation:  Sinus rhythm Baseline wander in lead(s) V2 V6 Confirmed by Calvina Liptak MD, Athalene Kolle ([id number]) on 04/16/2016 5:18:37 PM       Radiology Dg Chest 2 View  Result Date: 04/16/2016 CLINICAL DATA:  Low back pain and lower abd pain x 1 week HISTORY OF DM, HTN, IBS, COPD NO KNOWN INJURY EXAM: CHEST  2 VIEW COMPARISON:  12/07/2015; 02/15/2015 FINDINGS: Grossly unchanged cardiac silhouette and mediastinal contours with atherosclerotic plaque within the thoracic aorta. No focal airspace opacities.  No pleural effusion or pneumothorax. No evidence of edema. No acute osseus abnormalities. IMPRESSION: 1.  No acute cardiopulmonary disease. 2.  Aortic Atherosclerosis (ICD10-170.0) Electronically Signed   By: Simonne Come M.D.   On: 04/16/2016 18:00   Dg Lumbar Spine Complete  Result Date: 04/16/2016 CLINICAL DATA:  Low back and lower abdominal pain for 1 week. EXAM: LUMBAR SPINE - COMPLETE 4+ VIEW COMPARISON:  03/01/2014 FINDINGS: Stable degenerative lumbar spondylosis with multilevel disc disease and facet disease. Stable advanced facet disease but no definite pars defects. The possible inferior endplate fracture of L1. No other definite fractures are identified. Extensive vascular calcifications without definite aneurysm. The visualized bony pelvis is intact. IMPRESSION: Possible subtle inferior endplate fracture of L1. Stable degenerative lumbar spondylosis with multilevel disc disease and facet disease. Stable advanced vascular calcifications. Electronically Signed   By: Rudie Meyer M.D.   On: 04/16/2016 18:04    Procedures Procedures (including critical care time)  Medications Ordered in ED Medications  fentaNYL (SUBLIMAZE) injection 25 mcg (25 mcg Intravenous Given 04/16/16 1653)  ondansetron (ZOFRAN) injection 4 mg (4 mg Intravenous Given 04/16/16 1653)  fentaNYL (SUBLIMAZE) injection 100 mcg (100  mcg Intravenous Given 04/16/16 1831)     Initial Impression / Assessment and Plan / ED Course  I have reviewed the triage vital signs and the nursing notes.  Pertinent labs & imaging results that were available during my care of the patient were reviewed by me and considered in my medical decision making (see chart for details).    TLSO brace ordered and applied.  Pt knows to return if worse and to f/u with pcp/ortho.  Final Clinical Impressions(s) / ED Diagnoses   Final diagnoses:  Closed compression fracture of first lumbar vertebra, initial encounter (HCC)    New Prescriptions New Prescriptions   HYDROCODONE-ACETAMINOPHEN (NORCO/VICODIN) 5-325 MG TABLET    Take 1 tablet by mouth every 4 (four) hours as needed.     Jacalyn Lefevre, MD 04/16/16 2005

## 2016-04-16 NOTE — ED Triage Notes (Signed)
Low back pain and lower abd pain x 1 week

## 2016-04-17 NOTE — ED Provider Notes (Signed)
[facility] who states that the patient presented a prescription, written by Dr. Particia NearingHaviland, [facility].  The patient reportedly is on chronic therapy with Norco 10 mg tablet, and the pharmacist is not comfortable filling the prescription written yesterday.  I told her that it was okay to void this prescription.   Sheila BaleElliott Teah Votaw, MD 04/17/16 [phone]

## 2016-04-24 ENCOUNTER — Encounter: Payer: Self-pay | Admitting: Orthopaedic Surgery

## 2016-04-24 ENCOUNTER — Ambulatory Visit (INDEPENDENT_AMBULATORY_CARE_PROVIDER_SITE_OTHER): Payer: Medicare Other | Admitting: Orthopaedic Surgery

## 2016-04-24 VITALS — BP 190/86 | HR 79 | Temp 97.7°F | Ht 63.0 in | Wt 131.0 lb

## 2016-04-24 DIAGNOSIS — S32010A Wedge compression fracture of first lumbar vertebra, initial encounter for closed fracture: Secondary | ICD-10-CM | POA: Diagnosis not present

## 2016-04-24 MED ORDER — HYDROCODONE-ACETAMINOPHEN 5-325 MG PO TABS: ORAL_TABLET | ORAL | 0 refills | Status: DC
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
## 2016-05-01 DIAGNOSIS — J441 Chronic obstructive pulmonary disease with (acute) exacerbation: Secondary | ICD-10-CM | POA: Diagnosis not present

## 2016-05-08 ENCOUNTER — Ambulatory Visit (INDEPENDENT_AMBULATORY_CARE_PROVIDER_SITE_OTHER): Payer: Medicare Other

## 2016-05-08 ENCOUNTER — Encounter: Payer: Self-pay | Admitting: Orthopaedic Surgery

## 2016-05-08 ENCOUNTER — Ambulatory Visit (INDEPENDENT_AMBULATORY_CARE_PROVIDER_SITE_OTHER): Payer: Self-pay | Admitting: Orthopaedic Surgery

## 2016-05-08 DIAGNOSIS — S32010D Wedge compression fracture of first lumbar vertebra, subsequent encounter for fracture with routine healing: Secondary | ICD-10-CM

## 2016-05-08 NOTE — Progress Notes (Signed)
CC:  My back is just sore  She has been wearing her back brace.  She has little pain and no paresthesias.  NV intact.  Encounter Diagnosis  Name Primary?  . Closed compression fracture of L1 lumbar vertebra, with routine healing, subsequent encounter Yes   Continue the brace.  X-rays were done today, reported separately.  Return in one month.  X-rays then.  Call if any problem.  Precautions discussed.  Electronically Signed Darreld Mclean, MD 4/11/20188:31 AM

## 2016-05-08 NOTE — Progress Notes (Signed)
Follow up.

## 2016-06-04 ENCOUNTER — Other Ambulatory Visit: Payer: Self-pay | Admitting: Radiology

## 2016-06-04 DIAGNOSIS — S32010D Wedge compression fracture of first lumbar vertebra, subsequent encounter for fracture with routine healing: Secondary | ICD-10-CM

## 2016-06-05 ENCOUNTER — Ambulatory Visit (INDEPENDENT_AMBULATORY_CARE_PROVIDER_SITE_OTHER): Payer: Self-pay | Admitting: Orthopaedic Surgery

## 2016-06-05 ENCOUNTER — Ambulatory Visit (INDEPENDENT_AMBULATORY_CARE_PROVIDER_SITE_OTHER): Payer: Medicare Other

## 2016-06-05 DIAGNOSIS — S32010D Wedge compression fracture of first lumbar vertebra, subsequent encounter for fracture with routine healing: Secondary | ICD-10-CM

## 2016-06-05 NOTE — Progress Notes (Signed)
CC:  I am tired of this brace  She has no back pain.  She is wearing her back brace.  NV is intact.  X-rays were done, reported separately.  Encounter Diagnosis  Name Primary?  . Closed compression fracture of L1 lumbar vertebra, with routine healing, subsequent encounter Yes   Return in one month.  X-rays then.  Call if any problem.  Precautions discussed.  Electronically Signed Darreld McleanWayne Carinna Newhart, MD 5/9/20189:11 AM

## 2016-06-17 ENCOUNTER — Other Ambulatory Visit (HOSPITAL_COMMUNITY): Payer: Self-pay | Admitting: Pulmonary Disease

## 2016-06-17 DIAGNOSIS — M545 Low back pain: Secondary | ICD-10-CM | POA: Diagnosis not present

## 2016-06-17 DIAGNOSIS — I25119 Atherosclerotic heart disease of native coronary artery with unspecified angina pectoris: Secondary | ICD-10-CM | POA: Diagnosis not present

## 2016-06-17 DIAGNOSIS — E119 Type 2 diabetes mellitus without complications: Secondary | ICD-10-CM | POA: Diagnosis not present

## 2016-06-17 DIAGNOSIS — Z780 Asymptomatic menopausal state: Secondary | ICD-10-CM

## 2016-06-17 DIAGNOSIS — J449 Chronic obstructive pulmonary disease, unspecified: Secondary | ICD-10-CM | POA: Diagnosis not present

## 2016-06-27 ENCOUNTER — Ambulatory Visit (HOSPITAL_COMMUNITY)
Admission: RE | Admit: 2016-06-27 | Discharge: 2016-06-27 | Disposition: A | Discharge status: Home / Self Care | Payer: Medicare Other | Source: Ambulatory Visit | Attending: Pulmonary Disease | Admitting: Pulmonary Disease

## 2016-06-27 DIAGNOSIS — Z780 Asymptomatic menopausal state: Secondary | ICD-10-CM | POA: Insufficient documentation

## 2016-06-27 DIAGNOSIS — M810 Age-related osteoporosis without current pathological fracture: Secondary | ICD-10-CM | POA: Insufficient documentation

## 2016-07-02 ENCOUNTER — Other Ambulatory Visit: Payer: Self-pay | Admitting: Radiology

## 2016-07-02 DIAGNOSIS — S32010D Wedge compression fracture of first lumbar vertebra, subsequent encounter for fracture with routine healing: Secondary | ICD-10-CM

## 2016-07-03 ENCOUNTER — Ambulatory Visit (INDEPENDENT_AMBULATORY_CARE_PROVIDER_SITE_OTHER): Payer: Medicare Other

## 2016-07-03 ENCOUNTER — Ambulatory Visit (INDEPENDENT_AMBULATORY_CARE_PROVIDER_SITE_OTHER): Payer: Self-pay | Admitting: Orthopaedic Surgery

## 2016-07-03 DIAGNOSIS — S32010D Wedge compression fracture of first lumbar vertebra, subsequent encounter for fracture with routine healing: Secondary | ICD-10-CM

## 2016-07-03 NOTE — Progress Notes (Signed)
CC: my back is better  She has been wearing her back brace.  She has no problem.  NV intact.  X-rays were done, reported separately.  Encounter Diagnosis  Name Primary?  . Closed compression fracture of L1 lumbar vertebra, with routine healing, subsequent encounter Yes   She can stop the back brace.  Return in one month.  X-ray lateral spine then.  Call if any problem.  Precautions discussed.  Electronically Signed Darreld McleanWayne Clementine Soulliere, MD 6/6/20189:37 AM

## 2016-07-25 DIAGNOSIS — H401123 Primary open-angle glaucoma, left eye, severe stage: Secondary | ICD-10-CM | POA: Diagnosis not present

## 2016-07-25 DIAGNOSIS — H401112 Primary open-angle glaucoma, right eye, moderate stage: Secondary | ICD-10-CM | POA: Diagnosis not present

## 2016-08-01 ENCOUNTER — Ambulatory Visit: Payer: Medicare Other | Admitting: Orthopaedic Surgery

## 2016-08-06 ENCOUNTER — Ambulatory Visit (INDEPENDENT_AMBULATORY_CARE_PROVIDER_SITE_OTHER): Payer: Medicare Other

## 2016-08-06 ENCOUNTER — Ambulatory Visit (INDEPENDENT_AMBULATORY_CARE_PROVIDER_SITE_OTHER): Payer: Medicare Other | Admitting: Orthopaedic Surgery

## 2016-08-06 ENCOUNTER — Encounter: Payer: Self-pay | Admitting: Orthopaedic Surgery

## 2016-08-06 DIAGNOSIS — M25561 Pain in right knee: Secondary | ICD-10-CM

## 2016-08-06 DIAGNOSIS — G8929 Other chronic pain: Secondary | ICD-10-CM | POA: Diagnosis not present

## 2016-08-06 DIAGNOSIS — S32010D Wedge compression fracture of first lumbar vertebra, subsequent encounter for fracture with routine healing: Secondary | ICD-10-CM | POA: Diagnosis not present

## 2016-08-06 NOTE — Progress Notes (Signed)
Patient ON:GEXBM Sheila Myers, female DOB:28-Jun-1928, 83 y.o. WUX:[id number]  Chief Complaint  Patient presents with  . Follow-up    Closed compression fracture of L1    HPI  Sheila Myers is a 83 y.o. female who had a compression fracture of L1 in March.  She has been doing well from that but has developed some balance issues.  I will have PT see her.  She has had flare up of pain in the right knee.  She has swelling and popping.  I have not seen her for this in the past but she has had problems on and off with it for some time she says.  She has no locking or giving way. HPI  There is no height or weight on file to calculate BMI.  ROS  Review of Systems  HENT: Negative for congestion.   Respiratory: Positive for shortness of breath. Negative for cough.   Cardiovascular: Negative for chest pain and leg swelling.  Endocrine: Positive for cold intolerance.  Musculoskeletal: Positive for arthralgias and back pain.  Allergic/Immunologic: Positive for environmental allergies.    Past Medical History:  Diagnosis Date  . ASCVD (arteriosclerotic cardiovascular disease)    H/o PCI  . Chronic obstructive pulmonary disease (HCC)   . Degenerative joint disease    Right shoulder pain-mechanism unclear  . Depression   . Diabetes mellitus type II   . Gastroesophageal reflux disease    Diverticulosis, hemorrhoids with rectal bleeding,  . Glaucoma   . Hyperlipidemia   . Hypertension   . Irritable bowel syndrome    constipation alternating with diarrhea    Past Surgical History:  Procedure Laterality Date  . ABDOMINAL HYSTERECTOMY    . APPENDECTOMY    . CHOLECYSTECTOMY    . COLONOSCOPY  2007   Negative screening study except for diverticulosis    Family History  Problem Relation Age of Onset  . Aneurysm Mother        Cerebral  . Hypertension Mother     Social History Social History  Substance Use Topics  . Smoking status: Never Smoker  . Smokeless tobacco: Never Used   . Alcohol use No    Allergies  Allergen Reactions  . Peanut-Containing Drug Products Anaphylaxis, Itching and Rash    Allergy not confirmed-patient also mentions a rash and itching when eating peanut butter  . Catapres [Clonidine Hcl] Other (See Comments)    Dry mouth  . Codeine Other (See Comments)    "I just freak out"  . Morphine Other (See Comments)    "I just freak out"    Current Outpatient Prescriptions  Medication Sig Dispense Refill  . albuterol (PROVENTIL HFA;VENTOLIN HFA) 108 (90 Base) MCG/ACT inhaler Inhale 2 puffs into the lungs 4 (four) times daily.    Marland Kitchen ALPRAZolam (XANAX) 0.5 MG tablet Take 0.5 mg by mouth 3 (three) times daily as needed for anxiety.     Marland Kitchen amLODipine-benazepril (LOTREL) 10-20 MG per capsule Take 1 capsule by mouth daily. 90 capsule 1  . aspirin 325 MG tablet Take 1 tablet (325 mg total) by mouth daily. 30 tablet 12  . bimatoprost (LUMIGAN) 0.03 % ophthalmic drops Place 1 drop into both eyes at bedtime.      Marland Kitchen dextromethorphan-guaiFENesin (MUCINEX DM) 30-600 MG 12hr tablet Take 1 tablet by mouth 2 (two) times daily as needed for cough.     . hydrALAZINE (APRESOLINE) 25 MG tablet Take 25 mg by mouth 3 (three) times daily.     Marland Kitchen  HYDROcodone-acetaminophen (NORCO/VICODIN) 5-325 MG tablet One tablet every four hours as needed for acute pain.  Limit of five days per [place]. 30 tablet 0  . ipratropium-albuterol (DUONEB) 0.5-2.5 (3) MG/3ML SOLN Inhale 3 mLs into the lungs every 6 (six) hours as needed. Shortness of breath/wheezing    . lidocaine (XYLOCAINE) 5 % ointment Apply 1 application topically daily. To legs and feet    . meclizine (ANTIVERT) 25 MG tablet 1 tablet daily as needed.    . pravastatin (PRAVACHOL) 40 MG tablet Take 40 mg by mouth daily.  0   No current facility-administered medications for this visit.      Physical Exam  There were no vitals taken for this visit.  Constitutional: overall normal hygiene, normal nutrition, well  developed, normal grooming, normal body habitus. Assistive device:cane  Musculoskeletal: gait and station Limp right, muscle tone and strength are normal, no tremors or atrophy is present.  .  Neurological: coordination overall normal.  Deep tendon reflex/nerve stretch intact.  Sensation normal.  Cranial nerves II-XII intact.   Skin:   Normal overall no scars, lesions, ulcers or rashes. No psoriasis.  Psychiatric: Alert and oriented x 3.  Recent memory intact, remote memory unclear.  Normal mood and affect. Well groomed.  Good eye contact.  Cardiovascular: overall no swelling, no varicosities, no edema bilaterally, normal temperatures of the legs and arms, no clubbing, cyanosis and good capillary refill.  Lymphatic: palpation is normal.  The right lower extremity is examined:  Inspection:  Thigh:  Non-tender and no defects  Knee has swelling 1+ effusion.                        Joint tenderness is present                        Patient is tender over the medial joint line  Lower Leg:  Has normal appearance and no tenderness or defects  Ankle:  Non-tender and no defects  Foot:  Non-tender and no defects Range of Motion:  Knee:  Range of motion is: 0-105                        Crepitus is  present  Ankle:  Range of motion is normal. Strength and Tone:  The right lower extremity has normal strength and tone. Stability:  Knee:  The knee is stable.  Ankle:  The ankle is stable.  Her back is not tender.  She has good ROM and NV intact.  She uses a cane.  Her gait is good with limp to the right.  The patient has been educated about the nature of the problem(s) and counseled on treatment options.  The patient appeared to understand what I have discussed and is in agreement with it.  Encounter Diagnoses  Name Primary?  . Closed compression fracture of L1 lumbar vertebra, with routine healing, subsequent encounter Yes  . Chronic pain of right knee     PLAN Call if any problems.   Precautions discussed.  Continue current medications.   Return to clinic 1 month   Begin PT.  PROCEDURE NOTE:  The patient requests injections of the right knee , verbal consent was obtained.  The right knee was prepped appropriately after time out was performed.   Sterile technique was observed and injection of 1 cc of Depo-Medrol 40 mg with several cc's of plain xylocaine. Anesthesia was  provided by ethyl chloride and a 20-gauge needle was used to inject the knee area. The injection was tolerated well.  A band aid dressing was applied.  The patient was advised to apply ice later today and tomorrow to the injection sight as needed.  Electronically Signed Darreld McleanWayne Treveon Bourcier, MD 7/10/20189:46 AM

## 2016-08-13 ENCOUNTER — Encounter (HOSPITAL_COMMUNITY): Payer: Self-pay | Admitting: Physical Therapy

## 2016-08-13 ENCOUNTER — Ambulatory Visit (HOSPITAL_COMMUNITY): Payer: Medicare Other | Attending: Pulmonary Disease | Admitting: Physical Therapy

## 2016-08-13 DIAGNOSIS — R2681 Unsteadiness on feet: Secondary | ICD-10-CM | POA: Insufficient documentation

## 2016-08-13 DIAGNOSIS — M6281 Muscle weakness (generalized): Secondary | ICD-10-CM | POA: Diagnosis not present

## 2016-08-13 DIAGNOSIS — R293 Abnormal posture: Secondary | ICD-10-CM | POA: Diagnosis not present

## 2016-08-13 DIAGNOSIS — M545 Low back pain, unspecified: Secondary | ICD-10-CM

## 2016-08-13 NOTE — Patient Instructions (Signed)
    LUMBAR ROLL  Use a small lumbar roll in chairs you sit at frequently. Place the roll at the lower curve of your back. This will help you maintain better posture.   You may use this roll anywhere (car, home, restaurant) to help correct your posture.    Back Extension  Sitting with elbows bent and hands on head, lean back so upper back is bending backwards over the chair.  Repeat 10 times, 2 times a day.    BRIDGING  While lying on your back, tighten your lower abdominals, squeeze your buttocks and then raise your buttocks off the floor/bed as creating a "Bridge" with your body.   Repeat 10 times, twice a day.    Transverse Abdominus Activation  Lying on your back, pull your bellybutton into your spine.   It should feel like you are tensing all of your stomach muscles.  Hold for 2 seconds, then relax.  Repeat at least 20 times, 5 times per day.

## 2016-08-13 NOTE — Therapy (Signed)
[facility] [address] [place], [place], [address] Phone: [phone]   Fax:  [phone]  Physical Therapy Evaluation  Patient Details  Name: Sheila Myers MRN: [id number] Date of Birth: December 28, 1928 Referring Provider: Darreld Mclean   Encounter Date: 08/13/2016      PT End of Session - 08/13/16 1217    Visit Number 1   Number of Visits 13   Date for PT Re-Evaluation 09/03/16   Authorization Type [employer] Medicare/Medicaid    Authorization Time Period 08/13/16 to 09/24/16   Authorization - Visit Number 1   Authorization - Number of Visits 10   PT Start Time 1036  patient late    PT Stop Time 1112   PT Time Calculation (min) 36 min   Activity Tolerance Patient tolerated treatment well   Behavior During Therapy [facility] for tasks assessed/performed      Past Medical History:  Diagnosis Date  . ASCVD (arteriosclerotic cardiovascular disease)    H/o PCI  . Chronic obstructive pulmonary disease (HCC)   . Degenerative joint disease    Right shoulder pain-mechanism unclear  . Depression   . Diabetes mellitus type II   . Gastroesophageal reflux disease    Diverticulosis, hemorrhoids with rectal bleeding,  . Glaucoma   . Hyperlipidemia   . Hypertension   . Irritable bowel syndrome    constipation alternating with diarrhea    Past Surgical History:  Procedure Laterality Date  . ABDOMINAL HYSTERECTOMY    . APPENDECTOMY    . CHOLECYSTECTOMY    . COLONOSCOPY  2007   Negative screening study except for diverticulosis    There were no vitals filed for this visit.       Subjective Assessment - 08/13/16 1039    Subjective Paitent states that on a Saturday  in March 2018 she went to a birthday dinner, the next day she went to get out of bed and she was in a lot of pain. She went 2-3 days before going to the ED with pain. Her MD told her her bones were brittle and this is what likely contributed to her fracture. She is feeling better  now. She had to wear a brace for 3 months and is out of it now. SHe still has trouble doing housework and bending a lot. Her balance is also off.    Pertinent History HTN, hx TIA, pulmonary emphysema, DM, chronic LBP, COPD    How long can you sit comfortably? 60 minutes    How long can you stand comfortably? 15-20 minutes    How long can you walk comfortably? 400-52ft    Patient Stated Goals get active again, get back to gym    Currently in Pain? No/denies            [facility] PT Assessment - 08/13/16 0001      Assessment   Medical Diagnosis L1 compression fracture    Referring Provider Darreld Mclean    Onset Date/Surgical Date --  March 2018   Next MD Visit Dr. Hilda Lias on August 8th    Prior Therapy none for her back      Precautions   Precautions Back;Fall   Precaution Comments hx of L1 compression fracture      Balance Screen   Has the patient fallen in the past 6 months No   Has the patient had a decrease in activity level because of a fear of falling?  No   Is the patient reluctant to leave  their home because of a fear of falling?  No     Prior Function   Level of Independence Independent;Independent with basic ADLs;Independent with gait;Independent with transfers   Vocation Retired   Leisure very active, gym     Strength   Right Hip Flexion 4+/5   Right Hip Extension 2/5   Right Hip ABduction 4+/5   Left Hip Flexion 3+/5   Left Hip Extension 2/5   Left Hip ABduction 3+/5   Right Knee Flexion 4+/5   Right Knee Extension 4+/5   Left Knee Flexion 4/5   Left Knee Extension 4+/5   Right Ankle Dorsiflexion 4+/5   Left Ankle Dorsiflexion 5/5     Ambulation/Gait   Gait Comments flexed at hips, proximal wekaness, reduced step length, fatigue     6 minute walk test results    Aerobic Endurance Distance Walked 442   Endurance additional comments      Standardized Balance Assessment   Standardized Balance Assessment Timed Up and Go Test     Timed Up and Go  Test   Normal TUG (seconds) 14.3   TUG Comments no device      High Level Balance   High Level Balance Comments SLS 3 seconds B LEs             Objective measurements completed on examination: See above findings.                  PT Education - 08/13/16 1216    Education provided Yes   Education Details prognosis, POC, HEP; importance of avoiding flexed positions to prevent further compression fractures    Person(s) Educated Patient   Methods Explanation;Demonstration;Handout   Comprehension Verbalized understanding;Returned demonstration;Need further instruction          PT Short Term Goals - 08/13/16 1222      PT SHORT TERM GOAL #1   Title Patient to verbalize the importance of avoiding bent/flexed positions of her back to assist in preventing further developmet of compression fractures    Time 1   Period Weeks   Status New     PT SHORT TERM GOAL #2   Title Patient to be able to maintain correct posture without external cues at least 70% of the time in order to show improved core strength and mechancis    Time 3   Period Weeks   Status New     PT SHORT TERM GOAL #3   Title Patient to be able to ambulate 562ft during to show improved functional activity tolerance and community access    Time 3   Period Weeks   Status New     PT SHORT TERM GOAL #4   Title Patient to be compliant with correct performance of HEP, to be updated PRN    Time 1   Period Weeks   Status New           PT Long Term Goals - 08/13/16 1224      PT LONG TERM GOAL #1   Title Patient to demonstrate functional strength as being 5/5 in order to improve mechanics and balance    Time 6   Period Weeks   Status New     PT LONG TERM GOAL #2   Title Patient to be able to complete TUG test in 12 seconds and maintain SLS for 30 seconds on solid surface in order to show improved functional balance/reduced fall risk    Time 6  Period Weeks   Status New     PT LONG TERM  GOAL #3   Title Patient to be independent in modifying functional tasks and activities to avoid flexed posture of spine in order to show self-efficacy of managing condition    Time 6   Period Weeks   Status New     PT LONG TERM GOAL #4   Title Patient to report she has been able to return to 75% of her PLOF based activities without pain exacerbation in order to faciliate return to prior level of function and exercise    Time 6   Period Weeks   Status New                Plan - 08/13/16 1218    Clinical Impression Statement Patient arrives approximately 4 months after experiencing L1 compression fracture; her MD has taken her out of her brace and she reports that her biggest concern is returning to her PLOF and high activities levels in general. Examination reveals flexed posture, functional muscle weakness, reduced functional activity tolerance, gait impairment, and general unsteadiness. She will benefit from skilled PT services to address functional deficits, to correct mechanics to reduce chances of recurrence of condition, and to optimize overall level of function moving forward.    History and Personal Factors relevant to plan of care: very active PLOF, recent L1 compression fracture likely due to chronic forces    Clinical Presentation Stable   Clinical Presentation due to: poor mechanics/osteopenia    Clinical Decision Making Low   Rehab Potential Good   Clinical Impairments Affecting Rehab Potential (+) high PLOF, motivated to participate in PT   PT Frequency 2x / week   PT Duration 6 weeks   PT Treatment/Interventions ADLs/Self Care Home Management;Biofeedback;DME Instruction;Gait training;Stair training;Functional mobility training;Therapeutic activities;Therapeutic exercise;Balance training;Neuromuscular re-education;Patient/family education;Manual techniques;Passive range of motion;Taping   PT Next Visit Plan review eval and goals; focus on posture and core strength,  general upright and extended posture to avoid fruther compression fractures. Correct mechancis for functional tasks.    PT Home Exercise Plan Eval: use lumbar support, back extensions into chair, bridges, TA activation   Consulted and Agree with Plan of Care Patient      Patient will benefit from skilled therapeutic intervention in order to improve the following deficits and impairments:  Abnormal gait, Improper body mechanics, Decreased coordination, Decreased mobility, Postural dysfunction, Decreased activity tolerance, Decreased strength, Decreased balance, Difficulty walking  Visit Diagnosis: Acute bilateral low back pain without sciatica - Plan: PT plan of care cert/re-cert  Abnormal posture - Plan: PT plan of care cert/re-cert  Muscle weakness (generalized) - Plan: PT plan of care cert/re-cert  Unsteadiness on feet - Plan: PT plan of care cert/re-cert      G-Codes - 08/13/16 1226    Functional Assessment Tool Used (Outpatient Only) Based on skilled clinical assessment of posture, strenght, balance, safety awareness    Functional Limitation Mobility: Walking and moving around   Mobility: Walking and Moving Around Current Status ([id number]) At least 40 percent but less than 60 percent impaired, limited or restricted   Mobility: Walking and Moving Around Goal Status ([id number]) At least 20 percent but less than 40 percent impaired, limited or restricted       Problem List Patient Active Problem List   Diagnosis Date Noted  . Closed compression fracture of L1 lumbar vertebra, with routine healing, subsequent encounter 07/03/2016  . Chest pain 12/07/2015  . Chest pain at rest 12/07/2015  .  TIA (transient ischemic attack) 08/04/2015  . Slurred speech 08/04/2015  . Accelerated hypertension 08/04/2015  . Right arm pain 08/04/2015  . Cerebrovascular disease 09/28/2011  . Thyroid nodule 09/28/2011  . Irritable bowel syndrome   . Gastroesophageal reflux disease   . Arteriosclerotic  cardiovascular disease (ASCVD)   . Diabetes mellitus, type II (HCC)   . Hypertension   . Hyperlipidemia   . Pulmonary emphysema (HCC) 05/10/2008  . BACK PAIN, CHRONIC 05/10/2008    Nedra Hai PT, DPT [phone]  [facility] [address] [place], [place], [address] Phone: [phone]   Fax:  [phone]  Name: HASNA STEFANIK MRN: [id number] Date of Birth: 1928-05-06

## 2016-08-15 ENCOUNTER — Ambulatory Visit (HOSPITAL_COMMUNITY): Payer: Medicare Other | Admitting: Physical Therapy

## 2016-08-15 DIAGNOSIS — M545 Low back pain, unspecified: Secondary | ICD-10-CM

## 2016-08-15 DIAGNOSIS — J449 Chronic obstructive pulmonary disease, unspecified: Secondary | ICD-10-CM | POA: Diagnosis not present

## 2016-08-15 DIAGNOSIS — I25119 Atherosclerotic heart disease of native coronary artery with unspecified angina pectoris: Secondary | ICD-10-CM | POA: Diagnosis not present

## 2016-08-15 DIAGNOSIS — R293 Abnormal posture: Secondary | ICD-10-CM | POA: Diagnosis not present

## 2016-08-15 DIAGNOSIS — R2681 Unsteadiness on feet: Secondary | ICD-10-CM | POA: Diagnosis not present

## 2016-08-15 DIAGNOSIS — M6281 Muscle weakness (generalized): Secondary | ICD-10-CM

## 2016-08-15 DIAGNOSIS — E119 Type 2 diabetes mellitus without complications: Secondary | ICD-10-CM | POA: Diagnosis not present

## 2016-08-15 DIAGNOSIS — I10 Essential (primary) hypertension: Secondary | ICD-10-CM | POA: Diagnosis not present

## 2016-08-15 NOTE — Therapy (Signed)
[facility] [address] [place], [place], [address] Phone: [phone]   Fax:  [phone]  Physical Therapy Treatment  Patient Details  Name: Sheila Myers MRN: [id number] Date of Birth: 1928/07/26 Referring Provider: Darreld McleanWayne Keeling   Encounter Date: 08/15/2016      PT End of Session - 08/15/16 1019    Visit Number 2   Number of Visits 13   Date for PT Re-Evaluation 09/03/16   Authorization Type UHC Medicare/Medicaid    Authorization Time Period 08/13/16 to 09/24/16   Authorization - Visit Number 2   Authorization - Number of Visits 10   PT Start Time 0950   PT Stop Time 1030   PT Time Calculation (min) 40 min   Activity Tolerance Patient tolerated treatment well   Behavior During Therapy [facility] for tasks assessed/performed      Past Medical History:  Diagnosis Date  . ASCVD (arteriosclerotic cardiovascular disease)    H/o PCI  . Chronic obstructive pulmonary disease (HCC)   . Degenerative joint disease    Right shoulder pain-mechanism unclear  . Depression   . Diabetes mellitus type II   . Gastroesophageal reflux disease    Diverticulosis, hemorrhoids with rectal bleeding,  . Glaucoma   . Hyperlipidemia   . Hypertension   . Irritable bowel syndrome    constipation alternating with diarrhea    Past Surgical History:  Procedure Laterality Date  . ABDOMINAL HYSTERECTOMY    . APPENDECTOMY    . CHOLECYSTECTOMY    . COLONOSCOPY  2007   Negative screening study except for diverticulosis    There were no vitals filed for this visit.      Subjective Assessment - 08/15/16 0956    Subjective PT states she only has some discomfort in her lateral hips.  States she feels she may have broken her vertebra by "flopping" down too hard on the toilet when she had diarrhea.     Currently in Pain? No/denies                         [facility] Adult PT Treatment/Exercise - 08/15/16 0001      Knee/Hip Exercises:  Standing   Gait Training 226 feet without AD   Other Standing Knee Exercises UE flexion against wall, wall extension stretch 10 reps each     Knee/Hip Exercises: Seated   Sit to Sand 10 reps;without UE support     Knee/Hip Exercises: Supine   Bridges 10 reps   Other Supine Knee/Hip Exercises decompression exercises 1-5 5 reps each                PT Education - 08/15/16 0955    Education provided Yes   Education Details reviewed HEP and goals per evaluation.  Given copy of initial evaluation and added to HEP decompression exercises and sit to stands.    Person(s) Educated Patient   Methods Explanation;Demonstration;Tactile cues;Verbal cues;Handout   Comprehension Verbalized understanding;Returned demonstration;Verbal cues required;Tactile cues required;Need further instruction          PT Short Term Goals - 08/13/16 1222      PT SHORT TERM GOAL #1   Title Patient to verbalize the importance of avoiding bent/flexed positions of her back to assist in preventing further developmet of compression fractures    Time 1   Period Weeks   Status New     PT SHORT TERM GOAL #2   Title Patient to be able  to maintain correct posture without external cues at least 70% of the time in order to show improved core strength and mechancis    Time 3   Period Weeks   Status New     PT SHORT TERM GOAL #3   Title Patient to be able to ambulate 571ft during to show improved functional activity tolerance and community access    Time 3   Period Weeks   Status New     PT SHORT TERM GOAL #4   Title Patient to be compliant with correct performance of HEP, to be updated PRN    Time 1   Period Weeks   Status New           PT Long Term Goals - 08/13/16 1224      PT LONG TERM GOAL #1   Title Patient to demonstrate functional strength as being 5/5 in order to improve mechanics and balance    Time 6   Period Weeks   Status New     PT LONG TERM GOAL #2   Title Patient to be able  to complete TUG test in 12 seconds and maintain SLS for 30 seconds on solid surface in order to show improved functional balance/reduced fall risk    Time 6   Period Weeks   Status New     PT LONG TERM GOAL #3   Title Patient to be independent in modifying functional tasks and activities to avoid flexed posture of spine in order to show self-efficacy of managing condition    Time 6   Period Weeks   Status New     PT LONG TERM GOAL #4   Title Patient to report she has been able to return to 75% of her PLOF based activities without pain exacerbation in order to faciliate return to prior level of function and exercise    Time 6   Period Weeks   Status New               Plan - 08/15/16 1020    Clinical Impression Statement Pt arrives today carrying her SPC, however not using it.  STates she only has it for security.  Discussed goals and reviewed HEP with pateint. Progressed postural and core strengthening exercises.  Worked on gait using SPC and began decompression exercises.  Pt fatigued easily with ambulating 226 feet requiring rest break.  Noted "wobbling" at 180 feet.    Rehab Potential Good   Clinical Impairments Affecting Rehab Potential (+) high PLOF, motivated to participate in PT   PT Frequency 2x / week   PT Duration 6 weeks   PT Treatment/Interventions ADLs/Self Care Home Management;Biofeedback;DME Instruction;Gait training;Stair training;Functional mobility training;Therapeutic activities;Therapeutic exercise;Balance training;Neuromuscular re-education;Patient/family education;Manual techniques;Passive range of motion;Taping   PT Next Visit Plan Continue focus on posture and core strength, general upright and extended posture to avoid fruther compression fractures. Begin theraband postural exercises next session.  Begin balance exercises.     PT Home Exercise Plan Eval: use lumbar support, back extensions into chair, bridges, TA activation   Consulted and Agree with Plan of  Care Patient      Patient will benefit from skilled therapeutic intervention in order to improve the following deficits and impairments:  Abnormal gait, Improper body mechanics, Decreased coordination, Decreased mobility, Postural dysfunction, Decreased activity tolerance, Decreased strength, Decreased balance, Difficulty walking  Visit Diagnosis: Acute bilateral low back pain without sciatica  Abnormal posture  Muscle weakness (generalized)  Unsteadiness on feet  Problem List Patient Active Problem List   Diagnosis Date Noted  . Closed compression fracture of L1 lumbar vertebra, with routine healing, subsequent encounter 07/03/2016  . Chest pain 12/07/2015  . Chest pain at rest 12/07/2015  . TIA (transient ischemic attack) 08/04/2015  . Slurred speech 08/04/2015  . Accelerated hypertension 08/04/2015  . Right arm pain 08/04/2015  . Cerebrovascular disease 09/28/2011  . Thyroid nodule 09/28/2011  . Irritable bowel syndrome   . Gastroesophageal reflux disease   . Arteriosclerotic cardiovascular disease (ASCVD)   . Diabetes mellitus, type II (HCC)   . Hypertension   . Hyperlipidemia   . Pulmonary emphysema (HCC) 05/10/2008  . BACK PAIN, CHRONIC 05/10/2008   Lurena Nida, PTA/CLT [phone]  Lurena Nida 08/15/2016, 10:26 AM  [facility] [address] [place], [place], [address] Phone: [phone]   Fax:  [phone]  Name: Sheila Myers MRN: [id number] Date of Birth: Oct 21, 1928

## 2016-08-19 ENCOUNTER — Ambulatory Visit (HOSPITAL_COMMUNITY): Payer: Medicare Other | Admitting: Physical Therapy

## 2016-08-19 DIAGNOSIS — R2681 Unsteadiness on feet: Secondary | ICD-10-CM | POA: Diagnosis not present

## 2016-08-19 DIAGNOSIS — R293 Abnormal posture: Secondary | ICD-10-CM | POA: Diagnosis not present

## 2016-08-19 DIAGNOSIS — M545 Low back pain, unspecified: Secondary | ICD-10-CM

## 2016-08-19 DIAGNOSIS — M6281 Muscle weakness (generalized): Secondary | ICD-10-CM

## 2016-08-19 NOTE — Therapy (Signed)
[facility] [address] [place], [place], [address] Phone: [phone]   Fax:  [phone]  Physical Therapy Treatment  Patient Details  Name: Sheila Myers MRN: [id number] Date of Birth: 29-Jul-1928 Referring Provider: Darreld Mclean   Encounter Date: 08/19/2016      PT End of Session - 08/19/16 1120    Visit Number 3   Number of Visits 13   Date for PT Re-Evaluation 09/03/16   Authorization Type UHC Medicare/Medicaid    Authorization Time Period 08/13/16 to 09/24/16   Authorization - Visit Number 2   Authorization - Number of Visits 10   PT Start Time 1050  Pt late for appointment   PT Stop Time 1120   PT Time Calculation (min) 30 min   Activity Tolerance Patient tolerated treatment well   Behavior During Therapy [facility] for tasks assessed/performed      Past Medical History:  Diagnosis Date  . ASCVD (arteriosclerotic cardiovascular disease)    H/o PCI  . Chronic obstructive pulmonary disease (HCC)   . Degenerative joint disease    Right shoulder pain-mechanism unclear  . Depression   . Diabetes mellitus type II   . Gastroesophageal reflux disease    Diverticulosis, hemorrhoids with rectal bleeding,  . Glaucoma   . Hyperlipidemia   . Hypertension   . Irritable bowel syndrome    constipation alternating with diarrhea    Past Surgical History:  Procedure Laterality Date  . ABDOMINAL HYSTERECTOMY    . APPENDECTOMY    . CHOLECYSTECTOMY    . COLONOSCOPY  2007   Negative screening study except for diverticulosis    There were no vitals filed for this visit.      Subjective Assessment - 08/19/16 1058    Subjective Pt states that she is feeling better but when therapist asked her pain level it is 8/10.    Pertinent History HTN, hx TIA, pulmonary emphysema, DM, chronic LBP, COPD    How long can you sit comfortably? 60 minutes    How long can you stand comfortably? 15-20 minutes    How long can you walk comfortably?  400-585ft    Patient Stated Goals get active again, get back to gym    Currently in Pain? Yes   Pain Score 8    Pain Location Back   Pain Orientation Left   Pain Descriptors / Indicators Aching;Burning   Pain Type Chronic pain   Pain Radiating Towards To left thigh    Pain Onset More than a month ago                         [facility] Adult PT Treatment/Exercise - 08/19/16 0001      Exercises   Exercises Lumbar     Lumbar Exercises: Seated   Other Seated Lumbar Exercises scapular retraction, cervical retraction, sitting up tall all x 10      Lumbar Exercises: Supine   Bent Knee Raise 10 reps   Other Supine Lumbar Exercises decompression 1-5      Knee/Hip Exercises: Supine   Bridges 10 reps                PT Education - 08/19/16 1120    Education provided Yes   Education Details The importance of sitting and standing up tall   Person(s) Educated Patient   Methods Explanation   Comprehension Verbalized understanding;Need further instruction          PT  Short Term Goals - 08/19/16 1230      PT SHORT TERM GOAL #1   Title Patient to verbalize the importance of avoiding bent/flexed positions of her back to assist in preventing further developmet of compression fractures    Time 1   Period Weeks   Status On-going     PT SHORT TERM GOAL #2   Title Patient to be able to maintain correct posture without external cues at least 70% of the time in order to show improved core strength and mechancis    Time 3   Period Weeks   Status On-going     PT SHORT TERM GOAL #3   Title Patient to be able to ambulate 575ft during to show improved functional activity tolerance and community access    Time 3   Period Weeks   Status On-going     PT SHORT TERM GOAL #4   Title Patient to be compliant with correct performance of HEP, to be updated PRN    Time 1   Period Weeks   Status On-going           PT Long Term Goals - 08/19/16 1230      PT LONG  TERM GOAL #1   Title Patient to demonstrate functional strength as being 5/5 in order to improve mechanics and balance    Time 6   Period Weeks   Status On-going     PT LONG TERM GOAL #2   Title Patient to be able to complete TUG test in 12 seconds and maintain SLS for 30 seconds on solid surface in order to show improved functional balance/reduced fall risk    Time 6   Period Weeks   Status On-going     PT LONG TERM GOAL #3   Title Patient to be independent in modifying functional tasks and activities to avoid flexed posture of spine in order to show self-efficacy of managing condition    Time 6   Period Weeks   Status On-going     PT LONG TERM GOAL #4   Title Patient to report she has been able to return to 75% of her PLOF based activities without pain exacerbation in order to faciliate return to prior level of function and exercise    Time 6   Period Weeks   Status On-going               Plan - 08/19/16 1228    Clinical Impression Statement Pt with noted flexed posture.  Educated pt on the importance of elongating trunk to keep pressure off of vertebraes. Therapist added exercises to encourage elongation of spine while sittng and standing.    Rehab Potential Good   Clinical Impairments Affecting Rehab Potential (+) high PLOF, motivated to participate in PT   PT Frequency 2x / week   PT Duration 6 weeks   PT Treatment/Interventions ADLs/Self Care Home Management;Biofeedback;DME Instruction;Gait training;Stair training;Functional mobility training;Therapeutic activities;Therapeutic exercise;Balance training;Neuromuscular re-education;Patient/family education;Manual techniques;Passive range of motion;Taping   PT Next Visit Plan Continue focus on posture and core strength, general upright and extended posture to avoid fruther compression fractures. Begin theraband postural  and deconmpression exercises.  Begin balance exercises.     PT Home Exercise Plan Eval: use lumbar  support, back extensions into chair, bridges, TA activation   Consulted and Agree with Plan of Care Patient      Patient will benefit from skilled therapeutic intervention in order to improve the following deficits and impairments:  Abnormal gait, Improper body mechanics, Decreased coordination, Decreased mobility, Postural dysfunction, Decreased activity tolerance, Decreased strength, Decreased balance, Difficulty walking  Visit Diagnosis: Acute bilateral low back pain without sciatica  Abnormal posture  Muscle weakness (generalized)  Unsteadiness on feet     Problem List Patient Active Problem List   Diagnosis Date Noted  . Closed compression fracture of L1 lumbar vertebra, with routine healing, subsequent encounter 07/03/2016  . Chest pain 12/07/2015  . Chest pain at rest 12/07/2015  . TIA (transient ischemic attack) 08/04/2015  . Slurred speech 08/04/2015  . Accelerated hypertension 08/04/2015  . Right arm pain 08/04/2015  . Cerebrovascular disease 09/28/2011  . Thyroid nodule 09/28/2011  . Irritable bowel syndrome   . Gastroesophageal reflux disease   . Arteriosclerotic cardiovascular disease (ASCVD)   . Diabetes mellitus, type II (HCC)   . Hypertension   . Hyperlipidemia   . Pulmonary emphysema (HCC) 05/10/2008  . BACK PAIN, CHRONIC 05/10/2008    Virgina Organynthia Donavyn Fecher, PT CLT [phone] 08/19/2016, 12:31 PM  [facility] [address] [place], [place], [address] Phone: [phone]   Fax:  [phone]  Name: Sheila Myers MRN: [id number] Date of Birth: Oct 31, 1928

## 2016-08-19 NOTE — Patient Instructions (Signed)
                                     Repeat                                                         Hold                                          Duration                                              Complete                          Perform                          Time(s)

## 2016-08-22 ENCOUNTER — Ambulatory Visit (HOSPITAL_COMMUNITY): Payer: Medicare Other | Admitting: Physical Therapy

## 2016-08-22 DIAGNOSIS — R2681 Unsteadiness on feet: Secondary | ICD-10-CM | POA: Diagnosis not present

## 2016-08-22 DIAGNOSIS — M545 Low back pain, unspecified: Secondary | ICD-10-CM

## 2016-08-22 DIAGNOSIS — R293 Abnormal posture: Secondary | ICD-10-CM

## 2016-08-22 DIAGNOSIS — M6281 Muscle weakness (generalized): Secondary | ICD-10-CM

## 2016-08-22 NOTE — Therapy (Signed)
[facility] [address] [place], [place], [address] Phone: [phone]   Fax:  [phone]  Physical Therapy Treatment  Patient Details  Name: Sheila Myers MRN: [id number] Date of Birth: 1928-03-17 Referring Provider: Darreld McleanWayne Keeling   Encounter Date: 08/22/2016      PT End of Session - 08/22/16 1200    Visit Number 4   Number of Visits 13   Date for PT Re-Evaluation 09/03/16   Authorization Type UHC Medicare/Medicaid    Authorization Time Period 08/13/16 to 09/24/16   Authorization - Visit Number 4   Authorization - Number of Visits 10   PT Start Time 1120   PT Stop Time 1158   PT Time Calculation (min) 38 min   Activity Tolerance Patient tolerated treatment well   Behavior [facility] Therapy [facility] for tasks assessed/performed      Past Medical History:  Diagnosis Date  . ASCVD (arteriosclerotic cardiovascular disease)    H/o PCI  . Chronic obstructive pulmonary disease (HCC)   . Degenerative joint disease    Right shoulder pain-mechanism unclear  . Depression   . Diabetes mellitus type II   . Gastroesophageal reflux disease    Diverticulosis, hemorrhoids with rectal bleeding,  . Glaucoma   . Hyperlipidemia   . Hypertension   . Irritable bowel syndrome    constipation alternating with diarrhea    Past Surgical History:  Procedure Laterality Date  . ABDOMINAL HYSTERECTOMY    . APPENDECTOMY    . CHOLECYSTECTOMY    . COLONOSCOPY  2007   Negative screening study except for diverticulosis    There were no vitals filed for this visit.      Subjective Assessment - 08/22/16 1123    Subjective patient arrives stating she is feeling better, she started a new medincie for bone density. She rates her pain 8/10 even with description of pain scale, no signs or presentation of pain this high.    Pertinent History HTN, hx TIA, pulmonary emphysema, DM, chronic LBP, COPD    Patient Stated Goals get active again, get back to gym     Currently in Pain? Yes   Pain Score 8    Pain Location Back   Pain Orientation Right                         [facility] Adult PT Treatment/Exercise - 08/22/16 0001      Lumbar Exercises: Standing   Scapular Retraction 10 reps   Theraband Level (Scapular Retraction) Level 2 (Red)   Scapular Retraction Limitations mod cues verbal and tactile    Shoulder Extension Both;10 reps   Theraband Level (Shoulder Extension) Level 2 (Red)   Shoulder Extension Limitations min cues      Lumbar Exercises: Supine   Bridge 15 reps   Bridge Limitations cues for height of bridge    Other Supine Lumbar Exercises decompression exercises 2-4 x15 each; supine hip flexor stertch 2x60 seconds each side.     Other Supine Lumbar Exercises supine thoracic extensions with UEs overhead 1x10; supine to sit/sit to supine bed mobility with mod cues                 PT Education - 08/22/16 1159    Education provided Yes   Education Details hip flexor stretch HEP, bed mobility    Person(s) Educated Patient   Methods Explanation;Handout   Comprehension Verbalized understanding;Returned demonstration;Need further instruction  PT Short Term Goals - 08/19/16 1230      PT SHORT TERM GOAL #1   Title Patient to verbalize the importance of avoiding bent/flexed positions of her back to assist in preventing further developmet of compression fractures    Time 1   Period Weeks   Status On-going     PT SHORT TERM GOAL #2   Title Patient to be able to maintain correct posture without external cues at least 70% of the time in order to show improved core strength and mechancis    Time 3   Period Weeks   Status On-going     PT SHORT TERM GOAL #3   Title Patient to be able to ambulate 543ft during to show improved functional activity tolerance and community access    Time 3   Period Weeks   Status On-going     PT SHORT TERM GOAL #4   Title Patient to be compliant with correct  performance of HEP, to be updated PRN    Time 1   Period Weeks   Status On-going           PT Long Term Goals - 08/19/16 1230      PT LONG TERM GOAL #1   Title Patient to demonstrate functional strength as being 5/5 in order to improve mechanics and balance    Time 6   Period Weeks   Status On-going     PT LONG TERM GOAL #2   Title Patient to be able to complete TUG test in 12 seconds and maintain SLS for 30 seconds on solid surface in order to show improved functional balance/reduced fall risk    Time 6   Period Weeks   Status On-going     PT LONG TERM GOAL #3   Title Patient to be independent in modifying functional tasks and activities to avoid flexed posture of spine in order to show self-efficacy of managing condition    Time 6   Period Weeks   Status On-going     PT LONG TERM GOAL #4   Title Patient to report she has been able to return to 75% of her PLOF based activities without pain exacerbation in order to faciliate return to prior level of function and exercise    Time 6   Period Weeks   Status On-going               Plan - 08/22/16 1200    Clinical Impression Statement Patient arrives today reporting she is feeling better but her pain continues to be a 8/10, no signs/symptoms or distressed behavior correlating with this high pain rating noted. Continued with general theme of extensor and postural muscle strengthening today, as well as general education of importance of being straight and tall to reduce stress on vertebrae/prevent further compression fractures. Spent time reviewing bed mobility as well to protect lumbar spine during this transfer.    Rehab Potential Good   Clinical Impairments Affecting Rehab Potential (+) high PLOF, motivated to participate in PT   PT Frequency 2x / week   PT Duration 6 weeks   PT Treatment/Interventions ADLs/Self Care Home Management;Biofeedback;DME Instruction;Gait training;Stair training;Functional mobility  training;Therapeutic activities;Therapeutic exercise;Balance training;Neuromuscular re-education;Patient/family education;Manual techniques;Passive range of motion;Taping   PT Next Visit Plan continue working on posture and core, upright/extended posture. Continue working on bed mobility. Continue TB work. Balance.    PT Home Exercise Plan Eval: use lumbar support, back extensions into chair, bridges, TA activation; 7/26  hip flexor stretch    Consulted and Agree with Plan of Care Patient      Patient will benefit from skilled therapeutic intervention in order to improve the following deficits and impairments:  Abnormal gait, Improper body mechanics, Decreased coordination, Decreased mobility, Postural dysfunction, Decreased activity tolerance, Decreased strength, Decreased balance, Difficulty walking  Visit Diagnosis: Acute bilateral low back pain without sciatica  Abnormal posture  Muscle weakness (generalized)  Unsteadiness on feet     Problem List Patient Active Problem List   Diagnosis Date Noted  . Closed compression fracture of L1 lumbar vertebra, with routine healing, subsequent encounter 07/03/2016  . Chest pain 12/07/2015  . Chest pain at rest 12/07/2015  . TIA (transient ischemic attack) 08/04/2015  . Slurred speech 08/04/2015  . Accelerated hypertension 08/04/2015  . Right arm pain 08/04/2015  . Cerebrovascular disease 09/28/2011  . Thyroid nodule 09/28/2011  . Irritable bowel syndrome   . Gastroesophageal reflux disease   . Arteriosclerotic cardiovascular disease (ASCVD)   . Diabetes mellitus, type II (HCC)   . Hypertension   . Hyperlipidemia   . Pulmonary emphysema (HCC) 05/10/2008  . BACK PAIN, CHRONIC 05/10/2008    Nedra HaiKristen Lavida Patch PT, DPT [phone]  [facility] [address] [place], [place], [address] Phone: [phone]   Fax:  [phone]  Name: Sheila AltesQueen E Sebring MRN: [id number] Date of Birth:  August 02, 1928

## 2016-08-22 NOTE — Patient Instructions (Signed)
   HIP FLEXOR STRETCH (DO OFF THE SIDE OF YOUR BED AT HOME)  While lying on a table or high bed, let the affected leg lower towards the floor until a stretch is felt along the front of your thigh.  Hold for at least 30 seconds.  Repeat 2-3 times each leg, 2x/day.

## 2016-08-27 ENCOUNTER — Ambulatory Visit (HOSPITAL_COMMUNITY): Payer: Medicare Other

## 2016-08-27 DIAGNOSIS — R293 Abnormal posture: Secondary | ICD-10-CM

## 2016-08-27 DIAGNOSIS — M6281 Muscle weakness (generalized): Secondary | ICD-10-CM

## 2016-08-27 DIAGNOSIS — M545 Low back pain, unspecified: Secondary | ICD-10-CM

## 2016-08-27 DIAGNOSIS — R2681 Unsteadiness on feet: Secondary | ICD-10-CM | POA: Diagnosis not present

## 2016-08-27 NOTE — Therapy (Signed)
[facility] [address] [place], [place], [address] Phone: [phone]   Fax:  [phone]  Physical Therapy Treatment  Patient Details  Name: Sheila Myers MRN: [id number] Date of Birth: [birth year] Referring Provider: Darreld McleanWayne Keeling   Encounter Date: 08/27/2016      PT End of Session - 08/27/16 1000    Visit Number 5   Number of Visits 13   Date for PT Re-Evaluation 09/03/16   Authorization Type UHC Medicare/Medicaid    Authorization Time Period 08/13/16 to 09/24/16   Authorization - Visit Number 5   Authorization - Number of Visits 10   PT Start Time 0951   PT Stop Time 1029   PT Time Calculation (min) 38 min   Activity Tolerance Patient tolerated treatment well;No increased pain   Behavior During Therapy WFL for tasks assessed/performed      Past Medical History:  Diagnosis Date  . ASCVD (arteriosclerotic cardiovascular disease)    H/o PCI  . Chronic obstructive pulmonary disease (HCC)   . Degenerative joint disease    Right shoulder pain-mechanism unclear  . Depression   . Diabetes mellitus type II   . Gastroesophageal reflux disease    Diverticulosis, hemorrhoids with rectal bleeding,  . Glaucoma   . Hyperlipidemia   . Hypertension   . Irritable bowel syndrome    constipation alternating with diarrhea    Past Surgical History:  Procedure Laterality Date  . ABDOMINAL HYSTERECTOMY    . APPENDECTOMY    . CHOLECYSTECTOMY    . COLONOSCOPY  2007   Negative screening study except for diverticulosis    There were no vitals filed for this visit.      Subjective Assessment - 08/27/16 0955    Subjective Pt reports she is feeling good today, no reports of current pain.  reports compliance with HEP, does c/o LBP with shoulder flexion exercise.   Pertinent History HTN, hx TIA, pulmonary emphysema, DM, chronic LBP, COPD    Patient Stated Goals get active again, get back to gym    Currently in Pain? No/denies                [facility] Treatment/Exercise - 08/27/16 0001      Bed Mobility   Bed Mobility Left Sidelying to Sit  Supine to sidelying 5 reps then Lt sidelying to sitting 5rep   Left Sidelying to Sit 5: Supervision  CUeing for mechanics     Lumbar Exercises: Standing   Scapular Retraction 10 reps   Theraband Level (Scapular Retraction) Level 2 (Red)   Scapular Retraction Limitations mod cues verbal and tactile    Shoulder Extension Both;10 reps   Theraband Level (Shoulder Extension) Level 2 (Red)     Lumbar Exercises: Seated   Sit to Stand 5 reps   Sit to Stand Limitations eccentric control no HHA     Lumbar Exercises: Supine   Bridge 15 reps  2 setsx 15 reps   Other Supine Lumbar Exercises decompression exercise with RTB 4 positions 10 reps.                  PT Education - 08/27/16 1019    Education provided Yes   Education Details Reviewed body mechanics/bed mobiltiy   Person(s) Educated Patient   Methods Explanation;Demonstration;Tactile cues;Verbal cues   Comprehension Verbalized understanding;Returned demonstration;Need further instruction          PT Short Term Goals - 08/19/16 1230      PT  SHORT TERM GOAL #1   Title Patient to verbalize the importance of avoiding bent/flexed positions of her back to assist in preventing further developmet of compression fractures    Time 1   Period Weeks   Status On-going     PT SHORT TERM GOAL #2   Title Patient to be able to maintain correct posture without external cues at least 70% of the time in order to show improved core strength and mechancis    Time 3   Period Weeks   Status On-going     PT SHORT TERM GOAL #3   Title Patient to be able to ambulate 5450ft during 3MWT to show improved functional activity tolerance and community access    Time 3   Period Weeks   Status On-going     PT SHORT TERM GOAL #4   Title Patient to be compliant with correct performance of HEP, to be updated PRN    Time  1   Period Weeks   Status On-going           PT Long Term Goals - 08/19/16 1230      PT LONG TERM GOAL #1   Title Patient to demonstrate functional strength as being 5/5 in order to improve mechanics and balance    Time 6   Period Weeks   Status On-going     PT LONG TERM GOAL #2   Title Patient to be able to complete TUG test in 12 seconds and maintain SLS for 30 seconds on solid surface in order to show improved functional balance/reduced fall risk    Time 6   Period Weeks   Status On-going     PT LONG TERM GOAL #3   Title Patient to be independent in modifying functional tasks and activities to avoid flexed posture of spine in order to show self-efficacy of managing condition    Time 6   Period Weeks   Status On-going     PT LONG TERM GOAL #4   Title Patient to report she has been able to return to 75% of her PLOF based activities without pain exacerbation in order to faciliate return to prior level of function and exercise    Time 6   Period Weeks   Status On-going               Plan - 08/27/16 1027    Clinical Impression Statement Reviewed body mechanics with bed mobility with min to moderate cueing to improve mechanics.  Progressed postural strenghtening with additional theraband with decompression exercises and continues with standing postural strengthening exercises.  No reports of pain, pt required min to moderate cueing through session for proper form/technique with all therex.     Rehab Potential Good   Clinical Impairments Affecting Rehab Potential (+) high PLOF, motivated to participate in PT   PT Frequency 2x / week   PT Duration 6 weeks   PT Treatment/Interventions ADLs/Self Care Home Management;Biofeedback;DME Instruction;Gait training;Stair training;Functional mobility training;Therapeutic activities;Therapeutic exercise;Balance training;Neuromuscular re-education;Patient/family education;Manual techniques;Passive range of motion;Taping   PT Next  Visit Plan continue working on posture and core, upright/extended posture. Continue working on bed mobility. Continue TB work. Balance.    PT Home Exercise Plan Eval: use lumbar support, back extensions into chair, bridges, TA activation; 7/26 hip flexor stretch       Patient will benefit from skilled therapeutic intervention in order to improve the following deficits and impairments:  Abnormal gait, Improper body mechanics, Decreased coordination, Decreased mobility,  Postural dysfunction, Decreased activity tolerance, Decreased strength, Decreased balance, Difficulty walking  Visit Diagnosis: Acute bilateral low back pain without sciatica  Abnormal posture  Muscle weakness (generalized)  Unsteadiness on feet     Problem List Patient Active Problem List   Diagnosis Date Noted  . Closed compression fracture of L1 lumbar vertebra, with routine healing, subsequent encounter 07/03/2016  . Chest pain 12/07/2015  . Chest pain at rest 12/07/2015  . TIA (transient ischemic attack) 08/04/2015  . Slurred speech 08/04/2015  . Accelerated hypertension 08/04/2015  . Right arm pain 08/04/2015  . Cerebrovascular disease 09/28/2011  . Thyroid nodule 09/28/2011  . Irritable bowel syndrome   . Gastroesophageal reflux disease   . Arteriosclerotic cardiovascular disease (ASCVD)   . Diabetes mellitus, type II (HCC)   . Hypertension   . Hyperlipidemia   . Pulmonary emphysema (HCC) 05/10/2008  . BACK PAIN, CHRONIC 05/10/2008   Becky Sax, LPTA; CBIS [phone]  Juel Burrow 08/27/2016, 10:32 AM  [facility] [address] [place], [place], [address] Phone: [phone]   Fax:  [phone]  Name: Sheila Myers MRN: [id number] Date of Birth: 12-23-1928

## 2016-08-29 ENCOUNTER — Ambulatory Visit (HOSPITAL_COMMUNITY): Payer: Medicare Other | Attending: Pulmonary Disease

## 2016-08-29 ENCOUNTER — Encounter (HOSPITAL_COMMUNITY): Payer: Self-pay

## 2016-08-29 DIAGNOSIS — M6281 Muscle weakness (generalized): Secondary | ICD-10-CM

## 2016-08-29 DIAGNOSIS — M545 Low back pain, unspecified: Secondary | ICD-10-CM

## 2016-08-29 DIAGNOSIS — R293 Abnormal posture: Secondary | ICD-10-CM | POA: Insufficient documentation

## 2016-08-29 DIAGNOSIS — R2681 Unsteadiness on feet: Secondary | ICD-10-CM | POA: Insufficient documentation

## 2016-08-29 NOTE — Therapy (Signed)
[facility] [address] [place], [place], [address] Phone: [phone]   Fax:  [phone]  Physical Therapy Treatment  Patient Details  Name: Sheila Myers MRN: [id number] Date of Birth: [birth year] Referring Provider: Darreld Mclean   Encounter Date: 08/29/2016      PT End of Session - 08/29/16 1002    Visit Number 6   Number of Visits 13   Date for PT Re-Evaluation 09/03/16   Authorization Type UHC Medicare/Medicaid    Authorization Time Period 08/13/16 to 09/24/16   Authorization - Visit Number 6   Authorization - Number of Visits 10   PT Start Time 1000   PT Stop Time 1040   PT Time Calculation (min) 40 min   Activity Tolerance Patient tolerated treatment well;No increased pain   Behavior During Therapy WFL for tasks assessed/performed      Past Medical History:  Diagnosis Date  . ASCVD (arteriosclerotic cardiovascular disease)    H/o PCI  . Chronic obstructive pulmonary disease (HCC)   . Degenerative joint disease    Right shoulder pain-mechanism unclear  . Depression   . Diabetes mellitus type II   . Gastroesophageal reflux disease    Diverticulosis, hemorrhoids with rectal bleeding,  . Glaucoma   . Hyperlipidemia   . Hypertension   . Irritable bowel syndrome    constipation alternating with diarrhea    Past Surgical History:  Procedure Laterality Date  . ABDOMINAL HYSTERECTOMY    . APPENDECTOMY    . CHOLECYSTECTOMY    . COLONOSCOPY  2007   Negative screening study except for diverticulosis    There were no vitals filed for this visit.      Subjective Assessment - 08/29/16 1002    Subjective Pt states that she is feeling better. No pain today. States that exercises are going well at home.   Pertinent History HTN, hx TIA, pulmonary emphysema, DM, chronic LBP, COPD    Patient Stated Goals get active again, get back to gym    Currently in Pain? No/denies              [facility] Adult PT  Treatment/Exercise - 08/29/16 0001      Lumbar Exercises: Stretches   Standing Side Bend Limitations corner stretch 3x30 sec for posture     Lumbar Exercises: Standing   Scapular Retraction 15 reps   Theraband Level (Scapular Retraction) Level 2 (Red)   Scapular Retraction Limitations min verbal and tactile cues   Shoulder Extension 15 reps   Theraband Level (Shoulder Extension) Level 2 (Red)   Other Standing Lumbar Exercises Y's on wall with liftoff x10 reps (max cueing); bil staggered stance with front foot on 4" step and pulldowns with RTB x15reps each; sidestepping over 6" and 12" hurdles on firm x1 and attempted to go back on foam but pt unable   Other Standing Lumbar Exercises SLS 5x10" holds each on firm; marching on foam with 3" contralateral hold x10 reps each     Lumbar Exercises: Seated   Sit to Stand 10 reps   Sit to Stand Limitations no UE, RTB, eccentric control   Other Seated Lumbar Exercises 3D thoracic excursions x10 reps each     Lumbar Exercises: Supine   Bridge 10 reps   Bridge Limitations 3 sets; last 2 sets with RTB                PT Education - 08/29/16 1045    Education provided Yes  Education Details exercise technique, continue HEP   Person(s) Educated Patient   Methods Explanation;Demonstration;Tactile cues;Verbal cues   Comprehension Verbalized understanding;Returned demonstration;Need further instruction          PT Short Term Goals - 08/19/16 1230      PT SHORT TERM GOAL #1   Title Patient to verbalize the importance of avoiding bent/flexed positions of her back to assist in preventing further developmet of compression fractures    Time 1   Period Weeks   Status On-going     PT SHORT TERM GOAL #2   Title Patient to be able to maintain correct posture without external cues at least 70% of the time in order to show improved core strength and mechancis    Time 3   Period Weeks   Status On-going     PT SHORT TERM GOAL #3   Title  Patient to be able to ambulate 56250ft during 3MWT to show improved functional activity tolerance and community access    Time 3   Period Weeks   Status On-going     PT SHORT TERM GOAL #4   Title Patient to be compliant with correct performance of HEP, to be updated PRN    Time 1   Period Weeks   Status On-going           PT Long Term Goals - 08/19/16 1230      PT LONG TERM GOAL #1   Title Patient to demonstrate functional strength as being 5/5 in order to improve mechanics and balance    Time 6   Period Weeks   Status On-going     PT LONG TERM GOAL #2   Title Patient to be able to complete TUG test in 12 seconds and maintain SLS for 30 seconds on solid surface in order to show improved functional balance/reduced fall risk    Time 6   Period Weeks   Status On-going     PT LONG TERM GOAL #3   Title Patient to be independent in modifying functional tasks and activities to avoid flexed posture of spine in order to show self-efficacy of managing condition    Time 6   Period Weeks   Status On-going     PT LONG TERM GOAL #4   Title Patient to report she has been able to return to 75% of her PLOF based activities without pain exacerbation in order to faciliate return to prior level of function and exercise    Time 6   Period Weeks   Status On-going               Plan - 08/29/16 1004    Clinical Impression Statement Pt demo'd good bed mobility this date when getting on/off the mat table. Pt continues to require cues for proper exercise technique throughout session. Attempted sidestepping on foam over hurdles this date but pt unable to do so due to fear of falling/unsteadiness. Continue POC as planned.   Rehab Potential Good   Clinical Impairments Affecting Rehab Potential (+) high PLOF, motivated to participate in PT   PT Frequency 2x / week   PT Duration 6 weeks   PT Treatment/Interventions ADLs/Self Care Home Management;Biofeedback;DME Instruction;Gait training;Stair  training;Functional mobility training;Therapeutic activities;Therapeutic exercise;Balance training;Neuromuscular re-education;Patient/family education;Manual techniques;Passive range of motion;Taping   PT Next Visit Plan continue working on posture and core, upright/extended posture. Continue working on bed mobility. Progress TB work. Balance. try sidestepping on foam in // bars   PT Home Exercise  Plan Eval: use lumbar support, back extensions into chair, bridges, TA activation; 7/26 hip flexor stretch    Consulted and Agree with Plan of Care Patient      Patient will benefit from skilled therapeutic intervention in order to improve the following deficits and impairments:  Abnormal gait, Improper body mechanics, Decreased coordination, Decreased mobility, Postural dysfunction, Decreased activity tolerance, Decreased strength, Decreased balance, Difficulty walking  Visit Diagnosis: Acute bilateral low back pain without sciatica  Abnormal posture  Muscle weakness (generalized)  Unsteadiness on feet     Problem List Patient Active Problem List   Diagnosis Date Noted  . Closed compression fracture of L1 lumbar vertebra, with routine healing, subsequent encounter 07/03/2016  . Chest pain 12/07/2015  . Chest pain at rest 12/07/2015  . TIA (transient ischemic attack) 08/04/2015  . Slurred speech 08/04/2015  . Accelerated hypertension 08/04/2015  . Right arm pain 08/04/2015  . Cerebrovascular disease 09/28/2011  . Thyroid nodule 09/28/2011  . Irritable bowel syndrome   . Gastroesophageal reflux disease   . Arteriosclerotic cardiovascular disease (ASCVD)   . Diabetes mellitus, type II (HCC)   . Hypertension   . Hyperlipidemia   . Pulmonary emphysema (HCC) 05/10/2008  . BACK PAIN, CHRONIC 05/10/2008     Jac CanavanBrooke Powell PT, DPT  [facility] [address] [place], [place], [address] Phone: [phone]   Fax:  [phone]  Name: Sheila AltesQueen E  Myers MRN: [id number] Date of Birth: 07-17-1928

## 2016-09-03 ENCOUNTER — Ambulatory Visit (HOSPITAL_COMMUNITY): Payer: Medicare Other | Admitting: Physical Therapy

## 2016-09-03 ENCOUNTER — Ambulatory Visit (INDEPENDENT_AMBULATORY_CARE_PROVIDER_SITE_OTHER): Payer: Medicare Other | Admitting: Orthopaedic Surgery

## 2016-09-03 ENCOUNTER — Encounter: Payer: Self-pay | Admitting: Orthopaedic Surgery

## 2016-09-03 ENCOUNTER — Telehealth (HOSPITAL_COMMUNITY): Payer: Self-pay | Admitting: Physical Therapy

## 2016-09-03 VITALS — BP 198/91 | HR 67 | Temp 96.9°F | Ht 61.0 in | Wt 125.0 lb

## 2016-09-03 DIAGNOSIS — R2689 Other abnormalities of gait and mobility: Secondary | ICD-10-CM

## 2016-09-03 DIAGNOSIS — G8929 Other chronic pain: Secondary | ICD-10-CM | POA: Diagnosis not present

## 2016-09-03 DIAGNOSIS — M25561 Pain in right knee: Secondary | ICD-10-CM

## 2016-09-03 NOTE — Progress Notes (Signed)
Patient AV:WUJWJ:Sheila Virgilio FreesE Sheila Myers, female DOB:[birth year], 83 y.o. XBJ:[id number]  Chief Complaint  Patient presents with  . Follow-up    knee pain, balance issues    HPI  Sheila Myers is a 83 y.o. female who has right knee pain and balance problem issues. Her right knee is not hurting today.  She has less effusion and no giving way.  She is walking much better after PT and is seeing them for her balance issues.  She is benefiting from the therapy. HPI  Body mass index is 23.62 kg/m.  ROS  Review of Systems  HENT: Negative for congestion.   Respiratory: Positive for shortness of breath. Negative for cough.   Cardiovascular: Negative for chest pain and leg swelling.  Endocrine: Positive for cold intolerance.  Musculoskeletal: Positive for arthralgias and back pain.  Allergic/Immunologic: Positive for environmental allergies.    Past Medical History:  Diagnosis Date  . ASCVD (arteriosclerotic cardiovascular disease)    H/o PCI  . Chronic obstructive pulmonary disease (HCC)   . Degenerative joint disease    Right shoulder pain-mechanism unclear  . Depression   . Diabetes mellitus type II   . Gastroesophageal reflux disease    Diverticulosis, hemorrhoids with rectal bleeding,  . Glaucoma   . Hyperlipidemia   . Hypertension   . Irritable bowel syndrome    constipation alternating with diarrhea    Past Surgical History:  Procedure Laterality Date  . ABDOMINAL HYSTERECTOMY    . APPENDECTOMY    . CHOLECYSTECTOMY    . COLONOSCOPY  2007   Negative screening study except for diverticulosis    Family History  Problem Relation Age of Onset  . Aneurysm Mother        Cerebral  . Hypertension Mother     Social History Social History  Substance Use Topics  . Smoking status: Never Smoker  . Smokeless tobacco: Never Used  . Alcohol use No    Allergies  Allergen Reactions  . Peanut-Containing Drug Products Anaphylaxis, Itching and Rash    Allergy not confirmed-patient  also mentions a rash and itching when eating peanut butter  . Catapres [Clonidine Hcl] Other (See Comments)    Dry mouth  . Codeine Other (See Comments)    "I just freak out"  . Morphine Other (See Comments)    "I just freak out"    Current Outpatient Prescriptions  Medication Sig Dispense Refill  . albuterol (PROVENTIL HFA;VENTOLIN HFA) 108 (90 Base) MCG/ACT inhaler Inhale 2 puffs into the lungs 4 (four) times daily.    Marland Kitchen. ALPRAZolam (XANAX) 0.5 MG tablet Take 0.5 mg by mouth 3 (three) times daily as needed for anxiety.     Marland Kitchen. amLODipine-benazepril (LOTREL) 10-20 MG per capsule Take 1 capsule by mouth daily. 90 capsule 1  . aspirin 325 MG tablet Take 1 tablet (325 mg total) by mouth daily. 30 tablet 12  . bimatoprost (LUMIGAN) 0.03 % ophthalmic drops Place 1 drop into both eyes at bedtime.      Marland Kitchen. dextromethorphan-guaiFENesin (MUCINEX DM) 30-600 MG 12hr tablet Take 1 tablet by mouth 2 (two) times daily as needed for cough.     . hydrALAZINE (APRESOLINE) 25 MG tablet Take 25 mg by mouth 3 (three) times daily.     Marland Kitchen. HYDROcodone-acetaminophen (NORCO/VICODIN) 5-325 MG tablet One tablet every four hours as needed for acute pain.  Limit of five days per [place] statue. 30 tablet 0  . ipratropium-albuterol (DUONEB) 0.5-2.5 (3) MG/3ML SOLN Inhale 3 mLs into the  lungs every 6 (six) hours as needed. Shortness of breath/wheezing    . lidocaine (XYLOCAINE) 5 % ointment Apply 1 application topically daily. To legs and feet    . meclizine (ANTIVERT) 25 MG tablet 1 tablet daily as needed.    . pravastatin (PRAVACHOL) 40 MG tablet Take 40 mg by mouth daily.  0   No current facility-administered medications for this visit.      Physical Exam  Blood pressure (!) 198/91, pulse 67, temperature (!) 96.9 F (36.1 C), height 5\' 1"  (1.549 m), weight 125 lb (56.7 kg).  Constitutional: overall normal hygiene, normal nutrition, well developed, normal grooming, normal body habitus. Assistive  device:cane  Musculoskeletal: gait and station Limp none, muscle tone and strength are normal, no tremors or atrophy is present.  .  Neurological: coordination overall normal.  Deep tendon reflex/nerve stretch intact.  Sensation normal.  Cranial nerves II-XII intact.   Skin:   Normal overall no scars, lesions, ulcers or rashes. No psoriasis.  Psychiatric: Alert and oriented x 3.  Recent memory intact, remote memory unclear.  Normal mood and affect. Well groomed.  Good eye contact.  Cardiovascular: overall no swelling, no varicosities, no edema bilaterally, normal temperatures of the legs and arms, no clubbing, cyanosis and good capillary refill.  Lymphatic: palpation is normal.  Her balance and general walking is much improved.  The right lower extremity is examined:  Inspection:  Thigh:  Non-tender and no defects  Knee has swelling 1/2+ effusion.                        Joint tenderness is present                        Patient is tender over the medial joint line  Lower Leg:  Has normal appearance and no tenderness or defects  Ankle:  Non-tender and no defects  Foot:  Non-tender and no defects Range of Motion:  Knee:  Range of motion is: 0-110                        Crepitus is  present  Ankle:  Range of motion is normal. Strength and Tone:  The right lower extremity has normal strength and tone. Stability:  Knee:  The knee is stable.  Ankle:  The ankle is stable.    The patient has been educated about the nature of the problem(s) and counseled on treatment options.  The patient appeared to understand what I have discussed and is in agreement with it.  Encounter Diagnoses  Name Primary?  . Balance problem Yes  . Chronic pain of right knee     PLAN Call if any problems.  Precautions discussed.  Continue current medications.   Return to clinic 1 month   Continue PT.  Electronically Signed Darreld Mclean, MD 8/7/20189:40 AM

## 2016-09-03 NOTE — Telephone Encounter (Signed)
pt BP is over 200 and Dr. Mort SawyersHarrison's said for her not to take PT today.

## 2016-09-05 ENCOUNTER — Telehealth (HOSPITAL_COMMUNITY): Payer: Self-pay | Admitting: Physical Therapy

## 2016-09-05 ENCOUNTER — Ambulatory Visit (HOSPITAL_COMMUNITY): Payer: Medicare Other | Admitting: Physical Therapy

## 2016-09-05 NOTE — Telephone Encounter (Signed)
No-show #1. Called and spoke to patient, she states her blood pressure is still very high and this is why she didn't come. Reminded patient of next scheduled session.  Nedra HaiKristen Unger PT, DPT [phone]

## 2016-09-09 ENCOUNTER — Encounter (HOSPITAL_COMMUNITY): Payer: Self-pay | Admitting: Emergency Medicine

## 2016-09-09 ENCOUNTER — Emergency Department (HOSPITAL_COMMUNITY): Payer: Medicare Other

## 2016-09-09 ENCOUNTER — Emergency Department (HOSPITAL_COMMUNITY)
Admission: EM | Admit: 2016-09-09 | Discharge: 2016-09-09 | Disposition: A | Discharge status: Home / Self Care | Payer: Medicare Other | Attending: Emergency Medicine | Admitting: Emergency Medicine

## 2016-09-09 DIAGNOSIS — I10 Essential (primary) hypertension: Secondary | ICD-10-CM | POA: Insufficient documentation

## 2016-09-09 DIAGNOSIS — J45909 Unspecified asthma, uncomplicated: Secondary | ICD-10-CM | POA: Diagnosis not present

## 2016-09-09 DIAGNOSIS — Z79899 Other long term (current) drug therapy: Secondary | ICD-10-CM | POA: Diagnosis not present

## 2016-09-09 DIAGNOSIS — J449 Chronic obstructive pulmonary disease, unspecified: Secondary | ICD-10-CM | POA: Diagnosis not present

## 2016-09-09 DIAGNOSIS — R531 Weakness: Secondary | ICD-10-CM | POA: Insufficient documentation

## 2016-09-09 DIAGNOSIS — E119 Type 2 diabetes mellitus without complications: Secondary | ICD-10-CM | POA: Diagnosis not present

## 2016-09-09 DIAGNOSIS — Z91010 Allergy to peanuts: Secondary | ICD-10-CM | POA: Diagnosis not present

## 2016-09-09 DIAGNOSIS — R0602 Shortness of breath: Secondary | ICD-10-CM | POA: Insufficient documentation

## 2016-09-09 DIAGNOSIS — R05 Cough: Secondary | ICD-10-CM | POA: Insufficient documentation

## 2016-09-09 DIAGNOSIS — R079 Chest pain, unspecified: Secondary | ICD-10-CM | POA: Diagnosis not present

## 2016-09-09 DIAGNOSIS — R059 Cough, unspecified: Secondary | ICD-10-CM

## 2016-09-09 HISTORY — DX: Unspecified asthma, uncomplicated: J45.909

## 2016-09-09 LAB — BASIC METABOLIC PANEL
Anion gap: 7 (ref 5–15)
BUN: 17 mg/dL (ref 6–20)
CHLORIDE: 105 mmol/L (ref 101–111)
CO2: 29 mmol/L (ref 22–32)
Calcium: 9.4 mg/dL (ref 8.9–10.3)
Creatinine, Ser: 0.99 mg/dL (ref 0.44–1.00)
GFR calc non Af Amer: 51 mL/min — ABNORMAL LOW (ref >60)
GFR, EST AFRICAN AMERICAN: 59 mL/min — AB (ref >60)
Glucose, Bld: 116 mg/dL — ABNORMAL HIGH (ref 65–99)
POTASSIUM: 3.5 mmol/L (ref 3.5–5.1)
SODIUM: 141 mmol/L (ref 135–145)

## 2016-09-09 LAB — CBC
HEMATOCRIT: 42.4 % (ref 36.0–46.0)
Hemoglobin: 13.7 g/dL (ref 12.0–15.0)
MCH: 23.3 pg — ABNORMAL LOW (ref 26.0–34.0)
MCHC: 32.3 g/dL (ref 30.0–36.0)
MCV: 72.1 fL — AB (ref 78.0–100.0)
Platelets: 232 10*3/uL (ref 150–400)
RBC: 5.88 MIL/uL — AB (ref 3.87–5.11)
RDW: 16.3 % — ABNORMAL HIGH (ref 11.5–15.5)
WBC: 9.6 10*3/uL (ref 4.0–10.5)

## 2016-09-09 LAB — TROPONIN I: Troponin I: <0.03 ng/mL (ref <0.03)

## 2016-09-09 LAB — BRAIN NATRIURETIC PEPTIDE: B Natriuretic Peptide: 99 pg/mL (ref 0.0–100.0)

## 2016-09-09 MED ORDER — HYDRALAZINE HCL 25 MG PO TABS
25.0000 mg | ORAL_TABLET | Freq: Once | ORAL | Status: DC
Start: 2016-09-09 — End: 2016-09-09
  Filled 2016-09-09: qty 1

## 2016-09-09 MED ORDER — SODIUM CHLORIDE 0.9 % IV BOLUS (SEPSIS)
500.0000 mL | Freq: Once | INTRAVENOUS | Status: AC
  Administered 2016-09-09: 500 mL via INTRAVENOUS

## 2016-09-09 NOTE — ED Notes (Signed)
Pt ambulated well in hallway, steady gait.  No sob.

## 2016-09-09 NOTE — ED Notes (Signed)
EKG given to Dr. Mesner. 

## 2016-09-09 NOTE — ED Notes (Signed)
Pt made aware to return if symptoms worsen or if any life threatening symptoms occur.   

## 2016-09-09 NOTE — ED Notes (Signed)
Pt bp systolic is 130s for two consecutive readings. Dr. Clayborne DanaMesner aware, was advised to hold hydralazine at this time. Pharmacy tech called to request to go over med rec.

## 2016-09-09 NOTE — ED Triage Notes (Signed)
Pt c/o weakness and cough with white mucus x 2 days. Started being real tired and sharp pains to center of chest intermittent today. Nad. No resp distress or sob noted.

## 2016-09-09 NOTE — ED Provider Notes (Signed)
[facility]-EM[facility]Y DEPT Provider Note   CSN: [id number] Arrival date & time: 09/09/16  1302     History   Chief Complaint Chief Complaint  Patient presents with  . Chest Pain  . Weakness  . Cough    HPI Sheila Myers is a 83 y.o. female.  HPI  83 year old female with a past medical history of coronary artery disease, COPD, diabetes and hypertension presents to the emergency department with multiple complaints. Sounds like over the last week or so she's had worsening blood pressures along with shortness of breath, chest pain, blurry vision. She's had some sweatiness. No fevers but has had a little bit of cough worse at night. No lower extremity swelling or abdominal distention. Has had symptoms of this before with high blood pressure. She states compliance with all of her medications. No other associated or modifying symptoms.   Past Medical History:  Diagnosis Date  . ASCVD (arteriosclerotic cardiovascular disease)    H/o PCI  . Asthma   . Chronic obstructive pulmonary disease (HCC)   . Degenerative joint disease    Right shoulder pain-mechanism unclear  . Depression   . Diabetes mellitus type II   . Gastroesophageal reflux disease    Diverticulosis, hemorrhoids with rectal bleeding,  . Glaucoma   . Hyperlipidemia   . Hypertension   . Irritable bowel syndrome    constipation alternating with diarrhea    Patient Active Problem List   Diagnosis Date Noted  . Closed compression fracture of L1 lumbar vertebra, with routine healing, subsequent encounter 07/03/2016  . Chest pain 12/07/2015  . Chest pain at rest 12/07/2015  . TIA (transient ischemic attack) 08/04/2015  . Slurred speech 08/04/2015  . Accelerated hypertension 08/04/2015  . Right arm pain 08/04/2015  . Cerebrovascular disease 09/28/2011  . Thyroid nodule 09/28/2011  . Irritable bowel syndrome   . Gastroesophageal reflux disease   . Arteriosclerotic cardiovascular disease (ASCVD)   . Diabetes mellitus,  type II (HCC)   . Hypertension   . Hyperlipidemia   . Pulmonary emphysema (HCC) 05/10/2008  . BACK PAIN, CHRONIC 05/10/2008    Past Surgical History:  Procedure Laterality Date  . ABDOMINAL HYSTERECTOMY    . APPENDECTOMY    . CHOLECYSTECTOMY    . COLONOSCOPY  2007   Negative screening study except for diverticulosis    OB History    No data available       Home Medications    Prior to Admission medications   Medication Sig Start Date End Date Taking? Authorizing Provider  albuterol (PROVENTIL HFA;VENTOLIN HFA) 108 (90 Base) MCG/ACT inhaler Inhale 2 puffs into the lungs 4 (four) times daily.   Yes [provider]  alendronate (FOSAMAX) 70 MG tablet TK 1 Tabletby mouth every 7 DAYS 08/15/16  Yes [provider]  ALPRAZolam (XANAX) 0.5 MG tablet Take 0.5 mg by mouth 3 (three) times daily as needed for anxiety.    Yes [provider]  amLODipine-benazepril (LOTREL) 10-20 MG per capsule Take 1 capsule by mouth daily. 07/16/12  Yes Rothbart, Gerrit Friendsobert M, MD  aspirin 325 MG tablet Take 1 tablet (325 mg total) by mouth daily. 08/06/15  Yes Kari BaarsHawkins, Edward, MD  bimatoprost (LUMIGAN) 0.03 % ophthalmic drops Place 1 drop into both eyes at bedtime.     Yes [provider]  dextromethorphan-guaiFENesin (MUCINEX DM) 30-600 MG 12hr tablet Take 1 tablet by mouth 2 (two) times daily as needed for cough.    Yes [provider]  HYDROcodone-acetaminophen (NORCO/VICODIN)  5-325 MG tablet One tablet every four hours as needed for acute pain.  Limit of five days per [place] statue. 04/24/16  Yes Darreld Mclean, MD  ipratropium-albuterol (DUONEB) 0.5-2.5 (3) MG/3ML SOLN Inhale 3 mLs into the lungs every 6 (six) hours as needed. Shortness of breath/wheezing 07/31/15  Yes [provider]  lidocaine (XYLOCAINE) 5 % ointment Apply 1 application topically daily. To legs and feet 07/24/15  Yes [provider]  pravastatin (PRAVACHOL) 40 MG tablet Take 40 mg  by mouth daily. 06/30/15  Yes [provider]    Family History Family History  Problem Relation Age of Onset  . Aneurysm Mother        Cerebral  . Hypertension Mother     Social History Social History  Substance Use Topics  . Smoking status: Never Smoker  . Smokeless tobacco: Never Used  . Alcohol use No     Allergies   Peanut-containing drug products; Catapres [clonidine hcl]; Codeine; and Morphine   Review of Systems Review of Systems  All other systems reviewed and are negative.    Physical Exam Updated Vital Signs BP (!) 148/64 (BP Location: Left Arm)   Pulse 73   Temp 98.4 F (36.9 C) (Oral)   Resp 17   Ht 5\' 3"  (1.6 m)   Wt 58.1 kg (128 lb)   SpO2 99%   BMI 22.67 kg/m   Physical Exam  Constitutional: She is oriented to person, place, and time. She appears well-developed and well-nourished.  HENT:  Head: Normocephalic and atraumatic.  Eyes: Conjunctivae and EOM are normal.  Neck: Normal range of motion.  Cardiovascular: Normal rate and regular rhythm.   Pulmonary/Chest: Effort normal and breath sounds normal. No stridor. No respiratory distress. She has no wheezes. She has no rales.  Abdominal: Soft. She exhibits no distension. There is no tenderness.  Musculoskeletal: She exhibits no edema or deformity.  Neurological: She is alert and oriented to person, place, and time. No cranial nerve deficit. Coordination normal.  No altered mental status, able to give full seemingly accurate history.  Face is symmetric, EOM's intact, pupils equal and reactive, vision intact, tongue and uvula midline without deviation. Upper and Lower extremity motor 5/5, intact pain perception in distal extremities, 1+ reflexes in biceps, patella and achilles tendons. Able to perform finger to nose normal with both hands.  Skin: Skin is warm and dry. No erythema. No pallor.  Nursing note and vitals reviewed.    ED Treatments / Results  Labs (all labs ordered are  listed, but only abnormal results are displayed) Labs Reviewed  BASIC METABOLIC PANEL - Abnormal; Notable for the following:       Result Value   Glucose, Bld 116 (*)    GFR calc non Af Amer 51 (*)    GFR calc Af Amer 59 (*)    All other components within normal limits  CBC - Abnormal; Notable for the following:    RBC 5.88 (*)    MCV 72.1 (*)    MCH 23.3 (*)    RDW 16.3 (*)    All other components within normal limits  TROPONIN I  BRAIN NATRIURETIC PEPTIDE  TROPONIN I    EKG  EKG Interpretation  Date/Time:  Monday September 09 2016 13:15:32 EDT Ventricular Rate:  69 PR Interval:    QRS Duration: 95 QT Interval:  407 QTC Calculation: 436 R Axis:   49 Text Interpretation:  Sinus rhythm Atrial premature complex Anterior infarct, old Borderline  T abnormalities, inferior leads Borderline ST elevation, lateral leads Confirmed by Marily Memos [phone]) on 09/09/2016 3:31:06 PM       Radiology Dg Chest 2 View  Result Date: 09/09/2016 CLINICAL DATA:  Weakness and productive cough for the past 2 days. Increased fatigue. Intermittent sharp chest pain. No shortness of breath. History of coronary artery disease, diabetes, asthma. Nonsmoker. EXAM: CHEST  2 VIEW COMPARISON:  PA and lateral chest x-ray of April 16, 2016 FINDINGS: The lungs are reasonably well inflated. There is no focal infiltrate. There is no pleural effusion. The heart is top-normal in size. There is calcification in the wall of the thoracic aorta. The pulmonary vascularity is normal. There is multilevel degenerative disc disease of the lower thoracic spine. IMPRESSION: There is no pneumonia nor other acute cardiopulmonary abnormality. Mild enlargement of the cardiac silhouette, stable. Thoracic aortic atherosclerosis. Electronically Signed   By: David  [place] M.D.   On: 09/09/2016 13:43   Ct Head Wo Contrast  Result Date: 09/09/2016 CLINICAL DATA:  83 year old female with weakness for the past 2 days. Initial encounter. EXAM:  CT HEAD WITHOUT CONTRAST TECHNIQUE: Contiguous axial images were obtained from the base of the skull through the vertex without intravenous contrast. COMPARISON:  08/04/2015 CT. FINDINGS: Brain: No intracranial hemorrhage or CT evidence of large acute infarct. Remote small right cerebellar infarct. Chronic microvascular changes. Moderate global atrophy without hydrocephalus. No intracranial mass lesion noted on this unenhanced exam. Expanded partially empty sella unchanged. Vascular: Vascular calcifications. Skull: No acute worrisome abnormality.  No skull fracture. Sinuses/Orbits: No acute orbital abnormality. Opacification anterior ethmoid sinus air cell otherwise visualized sinuses clear. Other: Mastoid air cells and middle ear cavities are clear. IMPRESSION: No intracranial hemorrhage or CT evidence of large acute infarct. Remote small infarct right cerebellum. Chronic microvascular changes. Atrophy. Expanded partially empty sella unchanged Electronically Signed   By: Lacy Duverney M.D.   On: 09/09/2016 14:16    Procedures Procedures (including critical care time)  Medications Ordered in ED Medications  sodium chloride 0.9 % bolus 500 mL (0 mLs Intravenous Stopped 09/09/16 1808)     Initial Impression / Assessment and Plan / ED Course  I have reviewed the triage vital signs and the nursing notes.  Pertinent labs & imaging results that were available during my care of the patient were reviewed by me and considered in my medical decision making (see chart for details).  Symptoms likely 2/2 HTN, but will eval for end organ damage before acute management.      bp consistently lower without intervention. Symptoms improved. Ambulates normally. Neuro still intact. Vs wnl. Delta troponins normal. Plan for close PCP follow up.   Final Clinical Impressions(s) / ED Diagnoses   Final diagnoses:  Cough  Weakness  Hypertension, unspecified type    New Prescriptions New Prescriptions   No  medications on file     Gwynn Crossley, Barbara Cower, MD 09/09/16 1818

## 2016-09-10 ENCOUNTER — Ambulatory Visit (HOSPITAL_COMMUNITY): Payer: Medicare Other | Admitting: Physical Therapy

## 2016-09-10 DIAGNOSIS — R2681 Unsteadiness on feet: Secondary | ICD-10-CM

## 2016-09-10 DIAGNOSIS — M545 Low back pain, unspecified: Secondary | ICD-10-CM

## 2016-09-10 DIAGNOSIS — M6281 Muscle weakness (generalized): Secondary | ICD-10-CM

## 2016-09-10 DIAGNOSIS — R293 Abnormal posture: Secondary | ICD-10-CM | POA: Diagnosis not present

## 2016-09-10 NOTE — Therapy (Signed)
[place], [place], [address] Phone: [phone]   Fax:  [phone]  Physical Therapy Treatment (Re-Assessment)  Patient Details  Name: Sheila Myers MRN: [id number] Date of Birth: October 09, 1928 Referring Provider: Sanjuana Kava   Encounter Date: 09/10/2016      PT End of Session - 09/10/16 1209    Visit Number 7   Number of Visits 13   Date for PT Re-Evaluation 10/01/16   Authorization Type UHC Medicare/Medicaid (G-codes done 7th session)   Authorization Time Period 7/00/17 to 4/94/49; recert done 6/75    Authorization - Visit Number 7   Authorization - Number of Visits 17   PT Start Time 9163   PT Stop Time 1113   PT Time Calculation (min) 38 min   Activity Tolerance Patient tolerated treatment well   Behavior During Therapy [facility] for tasks assessed/performed      Past Medical History:  Diagnosis Date  . ASCVD (arteriosclerotic cardiovascular disease)    H/o PCI  . Asthma   . Chronic obstructive pulmonary disease ([place])   . Degenerative joint disease    Right shoulder pain-mechanism unclear  . Depression   . Diabetes mellitus type II   . Gastroesophageal reflux disease    Diverticulosis, hemorrhoids with rectal bleeding,  . Glaucoma   . Hyperlipidemia   . Hypertension   . Irritable bowel syndrome    constipation alternating with diarrhea    Past Surgical History:  Procedure Laterality Date  . ABDOMINAL HYSTERECTOMY    . APPENDECTOMY    . CHOLECYSTECTOMY    . COLONOSCOPY  2007   Negative screening study except for diverticulosis    There were no vitals filed for this visit.      Subjective Assessment - 09/10/16 1035    Subjective Patient states her BP is doing better, she was feeling better with all the exercises and work that she was doing with PT.    Pertinent History HTN, hx TIA, pulmonary emphysema, DM, chronic LBP, COPD    How long can you sit comfortably? 8/14- 30 minutse    How  long can you stand comfortably? 8/14- 15 minutes    How long can you walk comfortably? 8/14- 530f    Patient Stated Goals get active again, get back to gym    Currently in Pain? No/denies            [facility] - 09/10/16 0001      Assessment   Medical Diagnosis L1 compression fracture    Referring Provider WSanjuana Kava   Onset Date/Surgical Date --  March 2018   Next MD Visit Dr. KLuna GlasgowSeptember 4th    Prior Therapy none for her back      Precautions   Precautions Back;Fall     Balance Screen   Has the patient fallen in the past 6 months No   Has the patient had a decrease in activity level because of a fear of falling?  No   Is the patient reluctant to leave their home because of a fear of falling?  No     Prior Function   Level of Independence Independent;Independent with basic ADLs;Independent with gait;Independent with transfers   Vocation Retired   Leisure very active, gym     Strength   Right Hip Flexion 5/5   Right Hip Extension 2/5   Right Hip ABduction 5/5   Left Hip Flexion 4/5   Left Hip Extension 2+/5  Left Hip ABduction 4/5   Right Knee Flexion 4-/5   Right Knee Extension 4+/5   Left Knee Flexion 3+/5   Left Knee Extension 4/5   Right Ankle Dorsiflexion 5/5   Left Ankle Dorsiflexion 5/5     6 minute walk test results    Aerobic Endurance Distance Walked 452   Endurance additional comments 3MWT      Timed Up and Go Test   Normal TUG (seconds) 13.9   TUG Comments no device      High Level Balance   High Level Balance Comments SLS 2-3 seconds B                      [facility] Adult PT Treatment/Exercise - 09/10/16 0001      Lumbar Exercises: Standing   Other Standing Lumbar Exercises forward and lateral step ups 1x15 4 inch step upright posture, U HHA    Other Standing Lumbar Exercises red ball pushups with thoracic extension 1x10; seated thoracic extensions 1x10 with UE motion              Balance Exercises -  09/10/16 1108      Balance Exercises: Standing   Tandem Stance Eyes open;3 reps;15 secs           PT Education - 09/10/16 1209    Education provided Yes   Education Details progress thus far, POC moving forward, HEP update    Person(s) Educated Patient   Methods Explanation   Comprehension Verbalized understanding          PT Short Term Goals - 09/10/16 1054      PT SHORT TERM GOAL #1   Title Patient to verbalize the importance of avoiding bent/flexed positions of her back to assist in preventing further developmet of compression fractures    Baseline 8/14- educted in depth today    Time 1   Period Weeks   Status On-going     PT SHORT TERM GOAL #2   Title Patient to be able to maintain correct posture without external cues at least 70% of the time in order to show improved core strength and mechancis    Baseline 8/14- needs ongoing cues    Time 3   Period Weeks   Status On-going     PT SHORT TERM GOAL #3   Title Patient to be able to ambulate 561f during 3MWT to show improved functional activity tolerance and community access    Baseline 8/14- 452    Time 3   Period Weeks   Status On-going     PT SHORT TERM GOAL #4   Title Patient to be compliant with correct performance of HEP, to be updated PRN    Baseline 8/14- compliant    Time 1   Period Weeks   Status Achieved           PT Long Term Goals - 09/10/16 1057      PT LONG TERM GOAL #1   Title Patient to demonstrate functional strength as being 5/5 in order to improve mechanics and balance    Baseline 8/14- some improved    Time 6   Status Partially Met     PT LONG TERM GOAL #2   Title Patient to be able to complete TUG test in 12 seconds and maintain SLS for 30 seconds on solid surface in order to show improved functional balance/reduced fall risk    Baseline 8/14- TUG 14 seconds, SLS 2-3 seconds  Time 6   Period Weeks   Status On-going     PT LONG TERM GOAL #3   Title Patient to be  independent in modifying functional tasks and activities to avoid flexed posture of spine in order to show self-efficacy of managing condition    Baseline 10-05-2022- onoging    Time 6   Period Weeks   Status On-going     PT LONG TERM GOAL #4   Title Patient to report she has been able to return to 75% of her PLOF based activities without pain exacerbation in order to faciliate return to prior level of function and exercise    Baseline October 05, 2022- thrown back due to issues with blodo pressure, before this she was doing much better    Time 6   Period Weeks   Status On-going               Plan - 2016-10-04 1211    Clinical Impression Statement Re-assessment/recert performed today; note that patient has been out of PT since 08/29/16 due to medical issues regarding high blood pressure. BP taken at this session noted to be 155/81, HR 69. Patient appears to have made some minor gains in functional strength and balance however continues to demonstrate significant functional deficits in all other objective measures and areas. Recommend continuation of skilled PT services for intervention towards functional deficits moving forwards.    Rehab Potential Good   Clinical Impairments Affecting Rehab Potential (+) high PLOF, motivated to participate in PT   PT Frequency 2x / week   PT Duration 3 weeks   PT Treatment/Interventions ADLs/Self Care Home Management;Biofeedback;DME Instruction;Gait training;Stair training;Functional mobility training;Therapeutic activities;Therapeutic exercise;Balance training;Neuromuscular re-education;Patient/family education;Manual techniques;Passive range of motion;Taping   PT Next Visit Plan continue working on posture and core, upright/extended posture. Continue working on bed mobility. Progress TB work. Balance. try sidestepping on foam in // bars   PT Home Exercise Plan Eval: use lumbar support, back extensions into chair, bridges, TA activation; 7/26 hip flexor stretch     Consulted and Agree with Plan of Care Patient      Patient will benefit from skilled therapeutic intervention in order to improve the following deficits and impairments:  Abnormal gait, Improper body mechanics, Decreased coordination, Decreased mobility, Postural dysfunction, Decreased activity tolerance, Decreased strength, Decreased balance, Difficulty walking  Visit Diagnosis: Acute bilateral low back pain without sciatica - Plan: PT plan of care cert/re-cert  Abnormal posture - Plan: PT plan of care cert/re-cert  Muscle weakness (generalized) - Plan: PT plan of care cert/re-cert  Unsteadiness on feet - Plan: PT plan of care cert/re-cert       G-Codes - 10-04-2016 04/18/1209    Functional Assessment Tool Used (Outpatient Only) Based on skilled clinical assessment of posture, strenght, balance, safety awareness    Functional Limitation Mobility: Walking and moving around   Mobility: Walking and Moving Around Current Status (F7494) At least 40 percent but less than 60 percent impaired, limited or restricted   Mobility: Walking and Moving Around Goal Status (W9675) At least 20 percent but less than 40 percent impaired, limited or restricted      Problem List Patient Active Problem List   Diagnosis Date Noted  . Closed compression fracture of L1 lumbar vertebra, with routine healing, subsequent encounter 07/03/2016  . Chest pain 12/07/2015  . Chest pain at rest 12/07/2015  . TIA (transient ischemic attack) 08/04/2015  . Slurred speech 08/04/2015  . Accelerated hypertension 08/04/2015  . Right arm pain 08/04/2015  .  Cerebrovascular disease 09/28/2011  . Thyroid nodule 09/28/2011  . Irritable bowel syndrome   . Gastroesophageal reflux disease   . Arteriosclerotic cardiovascular disease (ASCVD)   . Diabetes mellitus, type II ([place])   . Hypertension   . Hyperlipidemia   . Pulmonary emphysema ([place]) 05/10/2008  . BACK PAIN, CHRONIC 05/10/2008    Deniece Ree PT,  DPT [facility] [address] [place], [place], [address] Phone: [phone]   Fax:  [phone]  Name: Sheila Myers MRN: [id number] Date of Birth: November 09, 1928

## 2016-09-10 NOTE — Patient Instructions (Signed)
   TANDEM STANCE BALANCE (DO AT COUNTER FOR SAFETY)  Stand and balnace in tandem stance. Hold this position for 10-15 seconds or more if you can.  Switch your feet.  Repeat 3 times each way, 2 times a day.

## 2016-09-12 ENCOUNTER — Ambulatory Visit (HOSPITAL_COMMUNITY): Payer: Medicare Other

## 2016-09-12 ENCOUNTER — Telehealth (HOSPITAL_COMMUNITY): Payer: Self-pay

## 2016-09-12 NOTE — Telephone Encounter (Signed)
No show, called and spoke to pt who stated she is waiting on ride thought her apt was scheduled for 1030.  Reminded next apt date and time with contact information given.    [address] Gartner RoadCasey Cockerham, LPTA; CBIS [phone]

## 2016-09-17 ENCOUNTER — Encounter (HOSPITAL_COMMUNITY): Payer: Self-pay

## 2016-09-17 ENCOUNTER — Ambulatory Visit (HOSPITAL_COMMUNITY): Payer: Medicare Other

## 2016-09-17 VITALS — BP 158/72 | HR 69

## 2016-09-17 DIAGNOSIS — R2681 Unsteadiness on feet: Secondary | ICD-10-CM

## 2016-09-17 DIAGNOSIS — R293 Abnormal posture: Secondary | ICD-10-CM | POA: Diagnosis not present

## 2016-09-17 DIAGNOSIS — M6281 Muscle weakness (generalized): Secondary | ICD-10-CM

## 2016-09-17 DIAGNOSIS — M545 Low back pain, unspecified: Secondary | ICD-10-CM

## 2016-09-17 NOTE — Therapy (Signed)
[place], [place], [address] Phone: [phone]   Fax:  [phone]  Physical Therapy Treatment  Patient Details  Name: Sheila Myers MRN: [id number] Date of Birth: July 09, 1928 Referring Provider: Sanjuana Kava   Encounter Date: 09/17/2016      PT End of Session - 09/17/16 0958    Visit Number 8   Number of Visits 13   Date for PT Re-Evaluation 10/01/16   Authorization Type UHC Medicare/Medicaid (G-codes done 7th session)   Authorization Time Period 7/35/32 to 9/92/42; recert done 6/83    Authorization - Visit Number 8   Authorization - Number of Visits 17   PT Start Time 0951   PT Stop Time 1029   PT Time Calculation (min) 38 min   Activity Tolerance Patient tolerated treatment well   Behavior During Therapy [facility] for tasks assessed/performed      Past Medical History:  Diagnosis Date  . ASCVD (arteriosclerotic cardiovascular disease)    H/o PCI  . Asthma   . Chronic obstructive pulmonary disease ([place])   . Degenerative joint disease    Right shoulder pain-mechanism unclear  . Depression   . Diabetes mellitus type II   . Gastroesophageal reflux disease    Diverticulosis, hemorrhoids with rectal bleeding,  . Glaucoma   . Hyperlipidemia   . Hypertension   . Irritable bowel syndrome    constipation alternating with diarrhea    Past Surgical History:  Procedure Laterality Date  . ABDOMINAL HYSTERECTOMY    . APPENDECTOMY    . CHOLECYSTECTOMY    . COLONOSCOPY  2007   Negative screening study except for diverticulosis    Vitals:   09/17/16 1045  BP: (!) 158/72  Pulse: 69        Subjective Assessment - 09/17/16 0954    Subjective Pt stated she is feeling good today, has taken her BP medication but has not checked it this morning.  No reoprts of pain today   Pertinent History HTN, hx TIA, pulmonary emphysema, DM, chronic LBP, COPD    Patient Stated Goals get active again, get back to gym     Currently in Pain? No/denies                         [facility] Adult PT Treatment/Exercise - 09/17/16 0001      Lumbar Exercises: Standing   Wall Slides 10 reps;2 seconds   Scapular Retraction 15 reps   Theraband Level (Scapular Retraction) Level 2 (Red)   Scapular Retraction Limitations min verbal and tactile cues   Row 15 reps;Theraband   Theraband Level (Row) Level 2 (Red)   Row Limitations cueing form   Shoulder Extension 15 reps   Theraband Level (Shoulder Extension) Level 2 (Red)   Shoulder Extension Limitations cueing for form   Other Standing Lumbar Exercises forward and lateral step ups 1x15 4 inch step upright posture, U HHA    Other Standing Lumbar Exercises y at wall 15x 5" holds     Lumbar Exercises: Seated   Sit to Stand 10 reps     Lumbar Exercises: Supine   Bridge 10 reps   Bridge Limitations 2 sets with RTB             Balance Exercises - 09/17/16 1029      Balance Exercises: Standing   Tandem Stance Eyes open;Foam/compliant surface;2 reps;30 secs   Sidestepping 2 reps;Theraband  2RT RTB no HHA in //  bars             PT Short Term Goals - 09/10/16 1054      PT SHORT TERM GOAL #1   Title Patient to verbalize the importance of avoiding bent/flexed positions of her back to assist in preventing further developmet of compression fractures    Baseline 8/14- educted in depth today    Time 1   Period Weeks   Status On-going     PT SHORT TERM GOAL #2   Title Patient to be able to maintain correct posture without external cues at least 70% of the time in order to show improved core strength and mechancis    Baseline 8/14- needs ongoing cues    Time 3   Period Weeks   Status On-going     PT SHORT TERM GOAL #3   Title Patient to be able to ambulate 527f during 3MWT to show improved functional activity tolerance and community access    Baseline 8/14- 452    Time 3   Period Weeks   Status On-going     PT SHORT TERM GOAL #4    Title Patient to be compliant with correct performance of HEP, to be updated PRN    Baseline 8/14- compliant    Time 1   Period Weeks   Status Achieved           PT Long Term Goals - 09/10/16 1057      PT LONG TERM GOAL #1   Title Patient to demonstrate functional strength as being 5/5 in order to improve mechanics and balance    Baseline 8/14- some improved    Time 6   Status Partially Met     PT LONG TERM GOAL #2   Title Patient to be able to complete TUG test in 12 seconds and maintain SLS for 30 seconds on solid surface in order to show improved functional balance/reduced fall risk    Baseline 8/14- TUG 14 seconds, SLS 2-3 seconds    Time 6   Period Weeks   Status On-going     PT LONG TERM GOAL #3   Title Patient to be independent in modifying functional tasks and activities to avoid flexed posture of spine in order to show self-efficacy of managing condition    Baseline 8/14- onoging    Time 6   Period Weeks   Status On-going     PT LONG TERM GOAL #4   Title Patient to report she has been able to return to 75% of her PLOF based activities without pain exacerbation in order to faciliate return to prior level of function and exercise    Baseline 8/14- thrown back due to issues with blodo pressure, before this she was doing much better    Time 6   Period Weeks   Status On-going               Plan - 09/17/16 1311    Clinical Impression Statement Session focus with therex for functional and postural strengthening.  Cueing for form through session and education with importance of reducting stress on lower back.  No reoprts of pain through session, was limited by fatigue at EOS.     Rehab Potential Good   Clinical Impairments Affecting Rehab Potential (+) high PLOF, motivated to participate in PT   PT Frequency 2x / week   PT Duration 3 weeks   PT Treatment/Interventions ADLs/Self Care Home Management;Biofeedback;DME Instruction;Gait training;Stair  training;Functional mobility training;Therapeutic activities;Therapeutic exercise;Balance  training;Neuromuscular re-education;Patient/family education;Manual techniques;Passive range of motion;Taping   PT Next Visit Plan continue working on posture and core, upright/extended posture. Continue working on bed mobility. Progress TB work. Balance. try sidestepping on foam in // bars   PT Home Exercise Plan Eval: use lumbar support, back extensions into chair, bridges, TA activation; 7/26 hip flexor stretch       Patient will benefit from skilled therapeutic intervention in order to improve the following deficits and impairments:  Abnormal gait, Improper body mechanics, Decreased coordination, Decreased mobility, Postural dysfunction, Decreased activity tolerance, Decreased strength, Decreased balance, Difficulty walking  Visit Diagnosis: Acute bilateral low back pain without sciatica  Abnormal posture  Muscle weakness (generalized)  Unsteadiness on feet     Problem List Patient Active Problem List   Diagnosis Date Noted  . Closed compression fracture of L1 lumbar vertebra, with routine healing, subsequent encounter 07/03/2016  . Chest pain 12/07/2015  . Chest pain at rest 12/07/2015  . TIA (transient ischemic attack) 08/04/2015  . Slurred speech 08/04/2015  . Accelerated hypertension 08/04/2015  . Right arm pain 08/04/2015  . Cerebrovascular disease 09/28/2011  . Thyroid nodule 09/28/2011  . Irritable bowel syndrome   . Gastroesophageal reflux disease   . Arteriosclerotic cardiovascular disease (ASCVD)   . Diabetes mellitus, type II ([place])   . Hypertension   . Hyperlipidemia   . Pulmonary emphysema ([place]) 05/10/2008  . BACK PAIN, CHRONIC 05/10/2008    Ihor Austin, LPTA; Iva  Aldona Lento 09/17/2016, 1:14 PM  [place], [place], [address] Phone: [phone]   Fax:   [phone]  Name: Sheila Myers MRN: [id number] Date of Birth: 1928-04-01

## 2016-09-19 ENCOUNTER — Ambulatory Visit (HOSPITAL_COMMUNITY): Payer: Medicare Other

## 2016-09-19 DIAGNOSIS — M545 Low back pain, unspecified: Secondary | ICD-10-CM

## 2016-09-19 DIAGNOSIS — M6281 Muscle weakness (generalized): Secondary | ICD-10-CM | POA: Diagnosis not present

## 2016-09-19 DIAGNOSIS — R2681 Unsteadiness on feet: Secondary | ICD-10-CM | POA: Diagnosis not present

## 2016-09-19 DIAGNOSIS — R293 Abnormal posture: Secondary | ICD-10-CM

## 2016-09-19 NOTE — Therapy (Signed)
[place], [place], [address] Phone: [phone]   Fax:  [phone]  Physical Therapy Treatment  Patient Details  Name: Sheila Myers MRN: [id number] Date of Birth: June 29, 1928 Referring Provider: Sanjuana Kava   Encounter Date: 09/19/2016      PT End of Session - 09/19/16 0956    Visit Number 9   Number of Visits 13   Date for PT Re-Evaluation 10/01/16   Authorization Type UHC Medicare/Medicaid (G-codes done 7th session)   Authorization Time Period 5/72/62 to 0/35/59; recert done 7/41    Authorization - Visit Number 9   Authorization - Number of Visits 17   PT Start Time 6384   PT Stop Time 1027   PT Time Calculation (min) 44 min   Activity Tolerance Patient tolerated treatment well   Behavior During Therapy [facility] for tasks assessed/performed      Past Medical History:  Diagnosis Date  . ASCVD (arteriosclerotic cardiovascular disease)    H/o PCI  . Asthma   . Chronic obstructive pulmonary disease ([place])   . Degenerative joint disease    Right shoulder pain-mechanism unclear  . Depression   . Diabetes mellitus type II   . Gastroesophageal reflux disease    Diverticulosis, hemorrhoids with rectal bleeding,  . Glaucoma   . Hyperlipidemia   . Hypertension   . Irritable bowel syndrome    constipation alternating with diarrhea    Past Surgical History:  Procedure Laterality Date  . ABDOMINAL HYSTERECTOMY    . APPENDECTOMY    . CHOLECYSTECTOMY    . COLONOSCOPY  2007   Negative screening study except for diverticulosis    There were no vitals filed for this visit.      Subjective Assessment - 09/19/16 0951    Subjective Pt stated she has increased pain following functional activities including laundry and vaccumming.  Reports she walked to mailbox for the first time yesterday and was limited by fatigue with activity.  Reports increased pain posterior thigh Rt>Lt today.   Pertinent History HTN,  hx TIA, pulmonary emphysema, DM, chronic LBP, COPD    Patient Stated Goals get active again, get back to gym    Currently in Pain? Yes   Pain Score 6    Pain Location Back   Pain Orientation Lower   Pain Descriptors / Indicators Pins and needles   Pain Radiating Towards Bil thigh Rt > Lt   Pain Onset More than a month ago   Aggravating Factors  standing for long periods of time, weather   Pain Relieving Factors pain medication   Effect of Pain on Daily Activities increased difficulty                         [facility] Adult PT Treatment/Exercise - 09/19/16 0001      Lumbar Exercises: Stretches   Active Hamstring Stretch 3 reps;30 seconds   Active Hamstring Stretch Limitations supine UE behind knees     Lumbar Exercises: Standing   Row 15 reps;Theraband   Theraband Level (Row) Level 3 (Green)   Row Limitations cueing for form   Shoulder Extension 15 reps   Theraband Level (Shoulder Extension) Level 3 (Green)   Shoulder Extension Limitations cueing for form   Other Standing Lumbar Exercises standing extension; Pavlo with GTB in partial tandem stance   Other Standing Lumbar Exercises y at wall 15x 5" holds     Lumbar Exercises: Seated  Sit to Stand 10 reps   Sit to Stand Limitations eccentric control no UE A     Lumbar Exercises: Supine   Bridge 10 reps   Bridge Limitations 2 sets with RTB                  PT Short Term Goals - 09/10/16 1054      PT SHORT TERM GOAL #1   Title Patient to verbalize the importance of avoiding bent/flexed positions of her back to assist in preventing further developmet of compression fractures    Baseline 8/14- educted in depth today    Time 1   Period Weeks   Status On-going     PT SHORT TERM GOAL #2   Title Patient to be able to maintain correct posture without external cues at least 70% of the time in order to show improved core strength and mechancis    Baseline 8/14- needs ongoing cues    Time 3   Period Weeks    Status On-going     PT SHORT TERM GOAL #3   Title Patient to be able to ambulate 59f during 3MWT to show improved functional activity tolerance and community access    Baseline 8/14- 452    Time 3   Period Weeks   Status On-going     PT SHORT TERM GOAL #4   Title Patient to be compliant with correct performance of HEP, to be updated PRN    Baseline 8/14- compliant    Time 1   Period Weeks   Status Achieved           PT Long Term Goals - 09/10/16 1057      PT LONG TERM GOAL #1   Title Patient to demonstrate functional strength as being 5/5 in order to improve mechanics and balance    Baseline 8/14- some improved    Time 6   Status Partially Met     PT LONG TERM GOAL #2   Title Patient to be able to complete TUG test in 12 seconds and maintain SLS for 30 seconds on solid surface in order to show improved functional balance/reduced fall risk    Baseline 8/14- TUG 14 seconds, SLS 2-3 seconds    Time 6   Period Weeks   Status On-going     PT LONG TERM GOAL #3   Title Patient to be independent in modifying functional tasks and activities to avoid flexed posture of spine in order to show self-efficacy of managing condition    Baseline 8/14- onoging    Time 6   Period Weeks   Status On-going     PT LONG TERM GOAL #4   Title Patient to report she has been able to return to 75% of her PLOF based activities without pain exacerbation in order to faciliate return to prior level of function and exercise    Baseline 8/14- thrown back due to issues with blodo pressure, before this she was doing much better    Time 6   Period Weeks   Status On-going               Plan - 09/19/16 1028    Clinical Impression Statement Pt arrived with reports of increased LBP and posterior thigh pain, reports she has increased activities at home wiht increased standing vaccumming and laundry and walking.  Therex focus on core stability, proximal strengthening and postural strengthening.   Pt continues to require cueing to improve posture through session  and proper form with therex.  Added pavlo for core strengthening in partial tandem stance to address balance with min A for safety.  Reports pain reduced to 3/10 at EOS.     Rehab Potential Good   Clinical Impairments Affecting Rehab Potential (+) high PLOF, motivated to participate in PT   PT Frequency 2x / week   PT Duration 3 weeks   PT Treatment/Interventions ADLs/Self Care Home Management;Biofeedback;DME Instruction;Gait training;Stair training;Functional mobility training;Therapeutic activities;Therapeutic exercise;Balance training;Neuromuscular re-education;Patient/family education;Manual techniques;Passive range of motion;Taping   PT Next Visit Plan continue working on posture and core, upright/extended posture. Continue working on bed mobility. Progress TB work. Balance. try sidestepping on foam in // bars   PT Home Exercise Plan Eval: use lumbar support, back extensions into chair, bridges, TA activation; 7/26 hip flexor stretch       Patient will benefit from skilled therapeutic intervention in order to improve the following deficits and impairments:  Abnormal gait, Improper body mechanics, Decreased coordination, Decreased mobility, Postural dysfunction, Decreased activity tolerance, Decreased strength, Decreased balance, Difficulty walking  Visit Diagnosis: Acute bilateral low back pain without sciatica  Abnormal posture  Muscle weakness (generalized)  Unsteadiness on feet     Problem List Patient Active Problem List   Diagnosis Date Noted  . Closed compression fracture of L1 lumbar vertebra, with routine healing, subsequent encounter 07/03/2016  . Chest pain 12/07/2015  . Chest pain at rest 12/07/2015  . TIA (transient ischemic attack) 08/04/2015  . Slurred speech 08/04/2015  . Accelerated hypertension 08/04/2015  . Right arm pain 08/04/2015  . Cerebrovascular disease 09/28/2011  . Thyroid nodule  09/28/2011  . Irritable bowel syndrome   . Gastroesophageal reflux disease   . Arteriosclerotic cardiovascular disease (ASCVD)   . Diabetes mellitus, type II ([place])   . Hypertension   . Hyperlipidemia   . Pulmonary emphysema ([place]) 05/10/2008  . BACK PAIN, CHRONIC 05/10/2008   Ihor Austin, [place]; [place]  Aldona Lento 09/19/2016, 12:49 PM  [place], [place], [address] Phone: [phone]   Fax:  [phone]  Name: Sheila Myers MRN: [id number] Date of Birth: 1928-07-20

## 2016-09-23 ENCOUNTER — Ambulatory Visit (HOSPITAL_COMMUNITY): Payer: Medicare Other

## 2016-09-23 DIAGNOSIS — R293 Abnormal posture: Secondary | ICD-10-CM | POA: Diagnosis not present

## 2016-09-23 DIAGNOSIS — M545 Low back pain, unspecified: Secondary | ICD-10-CM

## 2016-09-23 DIAGNOSIS — R2681 Unsteadiness on feet: Secondary | ICD-10-CM | POA: Diagnosis not present

## 2016-09-23 DIAGNOSIS — M6281 Muscle weakness (generalized): Secondary | ICD-10-CM | POA: Diagnosis not present

## 2016-09-23 NOTE — Therapy (Signed)
[place], [place], [address] Phone: [phone]   Fax:  [phone]  Physical Therapy Treatment  Patient Details  Name: Sheila Myers MRN: [id number] Date of Birth: 08-06-1928 Referring Provider: Sanjuana Kava   Encounter Date: 09/23/2016      PT End of Session - 09/23/16 0950    Visit Number 10   Number of Visits 13   Date for PT Re-Evaluation 10/01/16   Authorization Type UHC Medicare/Medicaid (G-codes done 7th session)   Authorization Time Period 7/67/34 to 1/93/79; recert done 0/24    Authorization - Visit Number 10   Authorization - Number of Visits 17   PT Start Time 0950   PT Stop Time 1029   PT Time Calculation (min) 39 min   Activity Tolerance Patient tolerated treatment well   Behavior During Therapy [facility] for tasks assessed/performed      Past Medical History:  Diagnosis Date  . ASCVD (arteriosclerotic cardiovascular disease)    H/o PCI  . Asthma   . Chronic obstructive pulmonary disease ([place])   . Degenerative joint disease    Right shoulder pain-mechanism unclear  . Depression   . Diabetes mellitus type II   . Gastroesophageal reflux disease    Diverticulosis, hemorrhoids with rectal bleeding,  . Glaucoma   . Hyperlipidemia   . Hypertension   . Irritable bowel syndrome    constipation alternating with diarrhea    Past Surgical History:  Procedure Laterality Date  . ABDOMINAL HYSTERECTOMY    . APPENDECTOMY    . CHOLECYSTECTOMY    . COLONOSCOPY  2007   Negative screening study except for diverticulosis    There were no vitals filed for this visit.      Subjective Assessment - 09/23/16 0950    Subjective Pt states that she is stiff and sore this morning, rates it 8/10 soreness in her lower back. She said she did a lot of sitting yesterday and thinks that could be what caused this.    Pertinent History HTN, hx TIA, pulmonary emphysema, DM, chronic LBP, COPD    Patient Stated  Goals get active again, get back to gym    Currently in Pain? Yes   Pain Score 8    Pain Location Back   Pain Orientation Lower   Pain Descriptors / Indicators Sore   Pain Type Chronic pain   Pain Onset More than a month ago   Pain Frequency Intermittent   Aggravating Factors  standing for long periods of time, weather   Pain Relieving Factors pain medication   Effect of Pain on Daily Activities increased difficulty              [facility] Adult PT Treatment/Exercise - 09/23/16 0001      Lumbar Exercises: Standing   Row 15 reps;Theraband   Theraband Level (Row) Level 3 (Green)   Row Limitations 2 sets; cueing for form   Shoulder Extension 15 reps   Theraband Level (Shoulder Extension) Level 3 (Green)   Shoulder Extension Limitations 2 sets; cueing for form   Other Standing Lumbar Exercises wall push-ups 2x10    Other Standing Lumbar Exercises Y's on wall and lfitoff with RTB      Lumbar Exercises: Seated   Other Seated Lumbar Exercises scap retraction with ER with GTB 2x15 reps; band pulls with RTB 2x15 reps         Balance Exercises - 09/23/16 1009      Balance Exercises: Standing  Tandem Stance --  bil tandem stance on firm and palov press with RTB x15 reps    Tandem Gait Forward;2 reps  27f   Sidestepping --  x3RT on foam in // bars               PT Education - 09/23/16 1030    Education provided Yes   Education Details exercise technique, continue HEP   Person(s) Educated Patient   Methods Explanation;Demonstration;Tactile cues;Verbal cues   Comprehension Verbalized understanding;Need further instruction          PT Short Term Goals - 09/10/16 1054      PT SHORT TERM GOAL #1   Title Patient to verbalize the importance of avoiding bent/flexed positions of her back to assist in preventing further developmet of compression fractures    Baseline 8/14- educted in depth today    Time 1   Period Weeks   Status On-going     PT SHORT TERM GOAL #2    Title Patient to be able to maintain correct posture without external cues at least 70% of the time in order to show improved core strength and mechancis    Baseline 8/14- needs ongoing cues    Time 3   Period Weeks   Status On-going     PT SHORT TERM GOAL #3   Title Patient to be able to ambulate 5577fduring 3MWT to show improved functional activity tolerance and community access    Baseline 8/14- 452    Time 3   Period Weeks   Status On-going     PT SHORT TERM GOAL #4   Title Patient to be compliant with correct performance of HEP, to be updated PRN    Baseline 8/14- compliant    Time 1   Period Weeks   Status Achieved           PT Long Term Goals - 09/10/16 1057      PT LONG TERM GOAL #1   Title Patient to demonstrate functional strength as being 5/5 in order to improve mechanics and balance    Baseline 8/14- some improved    Time 6   Status Partially Met     PT LONG TERM GOAL #2   Title Patient to be able to complete TUG test in 12 seconds and maintain SLS for 30 seconds on solid surface in order to show improved functional balance/reduced fall risk    Baseline 8/14- TUG 14 seconds, SLS 2-3 seconds    Time 6   Period Weeks   Status On-going     PT LONG TERM GOAL #3   Title Patient to be independent in modifying functional tasks and activities to avoid flexed posture of spine in order to show self-efficacy of managing condition    Baseline 8/14- onoging    Time 6   Period Weeks   Status On-going     PT LONG TERM GOAL #4   Title Patient to report she has been able to return to 75% of her PLOF based activities without pain exacerbation in order to faciliate return to prior level of function and exercise    Baseline 8/14- thrown back due to issues with blodo pressure, before this she was doing much better    Time 6   Period Weeks   Status On-going               Plan - 09/23/16 1031    Clinical Impression Statement Pt reported increased LBP soreness and  stiffness after she sat a lot over the weekend at church. Session focused on posture and balance per POC. Pt required mod verbal and tactile cues throughout session for exercise technique. She reported no pain or stiffness at EOS. Continue POC as planned.   Rehab Potential Good   Clinical Impairments Affecting Rehab Potential (+) high PLOF, motivated to participate in PT   PT Frequency 2x / week   PT Duration 3 weeks   PT Treatment/Interventions ADLs/Self Care Home Management;Biofeedback;DME Instruction;Gait training;Stair training;Functional mobility training;Therapeutic activities;Therapeutic exercise;Balance training;Neuromuscular re-education;Patient/family education;Manual techniques;Passive range of motion;Taping   PT Next Visit Plan continue working on posture and core, upright/extended posture. Continue working on bed mobility. Progress TB work. progress balance.    PT Home Exercise Plan Eval: use lumbar support, back extensions into chair, bridges, TA activation; 7/26 hip flexor stretch    Consulted and Agree with Plan of Care Patient      Patient will benefit from skilled therapeutic intervention in order to improve the following deficits and impairments:  Abnormal gait, Improper body mechanics, Decreased coordination, Decreased mobility, Postural dysfunction, Decreased activity tolerance, Decreased strength, Decreased balance, Difficulty walking  Visit Diagnosis: Acute bilateral low back pain without sciatica  Abnormal posture  Muscle weakness (generalized)  Unsteadiness on feet     Problem List Patient Active Problem List   Diagnosis Date Noted  . Closed compression fracture of L1 lumbar vertebra, with routine healing, subsequent encounter 07/03/2016  . Chest pain 12/07/2015  . Chest pain at rest 12/07/2015  . TIA (transient ischemic attack) 08/04/2015  . Slurred speech 08/04/2015  . Accelerated hypertension 08/04/2015  . Right arm pain 08/04/2015  . Cerebrovascular  disease 09/28/2011  . Thyroid nodule 09/28/2011  . Irritable bowel syndrome   . Gastroesophageal reflux disease   . Arteriosclerotic cardiovascular disease (ASCVD)   . Diabetes mellitus, type II ([place])   . Hypertension   . Hyperlipidemia   . Pulmonary emphysema ([place]) 05/10/2008  . BACK PAIN, CHRONIC 05/10/2008      Geraldine Solar PT, DPT  [place] [address] [place], [place], [address] Phone: [phone]   Fax:  [phone]  Name: Sheila Myers MRN: [id number] Date of Birth: 05/10/1928

## 2016-09-27 ENCOUNTER — Ambulatory Visit (HOSPITAL_COMMUNITY): Payer: Medicare Other

## 2016-09-27 ENCOUNTER — Encounter (HOSPITAL_COMMUNITY): Payer: Self-pay

## 2016-09-27 DIAGNOSIS — M545 Low back pain, unspecified: Secondary | ICD-10-CM

## 2016-09-27 DIAGNOSIS — M6281 Muscle weakness (generalized): Secondary | ICD-10-CM | POA: Diagnosis not present

## 2016-09-27 DIAGNOSIS — R2681 Unsteadiness on feet: Secondary | ICD-10-CM

## 2016-09-27 DIAGNOSIS — R293 Abnormal posture: Secondary | ICD-10-CM

## 2016-09-27 NOTE — Therapy (Signed)
[place], [place], [address] Phone: [phone]   Fax:  [phone]  Physical Therapy Treatment  Patient Details  Name: Sheila Myers MRN: [id number] Date of Birth: [birth year] Referring Provider: Sanjuana Kava   Encounter Date: 09/27/2016      PT End of Session - 09/27/16 1113    Visit Number 11   Number of Visits 13   Date for PT Re-Evaluation 10/01/16   Authorization Type UHC Medicare/Medicaid (G-codes done 7th session)   Authorization Time Period 8/88/28 to 0/03/49; recert done 1/79    Authorization - Visit Number 11   Authorization - Number of Visits 17   PT Start Time 1505   PT Stop Time 1114   PT Time Calculation (min) 39 min   Activity Tolerance Patient tolerated treatment well   Behavior During Therapy [facility] for tasks assessed/performed      Past Medical History:  Diagnosis Date  . ASCVD (arteriosclerotic cardiovascular disease)    H/o PCI  . Asthma   . Chronic obstructive pulmonary disease ([place])   . Degenerative joint disease    Right shoulder pain-mechanism unclear  . Depression   . Diabetes mellitus type II   . Gastroesophageal reflux disease    Diverticulosis, hemorrhoids with rectal bleeding,  . Glaucoma   . Hyperlipidemia   . Hypertension   . Irritable bowel syndrome    constipation alternating with diarrhea    Past Surgical History:  Procedure Laterality Date  . ABDOMINAL HYSTERECTOMY    . APPENDECTOMY    . CHOLECYSTECTOMY    . COLONOSCOPY  2007   Negative screening study except for diverticulosis    There were no vitals filed for this visit.      Subjective Assessment - 09/27/16 1038    Subjective Pt states that she is still stiff in her lower back this morning and her R knee is swollen. She states that she did not do anything out of the ordinary to cause this.    Pertinent History HTN, hx TIA, pulmonary emphysema, DM, chronic LBP, COPD    Patient Stated Goals get  active again, get back to gym    Currently in Pain? Yes   Pain Score 6    Pain Location Back   Pain Orientation Lower   Pain Descriptors / Indicators Sore;Tightness   Pain Onset More than a month ago   Pain Frequency Intermittent   Aggravating Factors  standing for long periods of time, weather   Pain Relieving Factors pain medication   Effect of Pain on Daily Activities increased difficulty               [facility] Adult PT Treatment/Exercise - 09/27/16 0001      Lumbar Exercises: Stretches   Standing Side Bend Limitations corner stretch 3x30 sec for posture     Lumbar Exercises: Standing   Row Both;10 reps   Theraband Level (Row) Level 4 (Blue)   Row Limitations 2 sets   Shoulder Extension Both;10 reps   Theraband Level (Shoulder Extension) Level 4 (Blue)   Shoulder Extension Limitations 2 sets   Other Standing Lumbar Exercises band pulls with GTB and scap retraction with ER with GTB 2x10 reps each   Other Standing Lumbar Exercises Y's on wall and lfitoff with RTB 2x10; UE flexion against wall with RTB 2x10     Lumbar Exercises: Seated   Other Seated Lumbar Exercises 3D thoracic excursions x10 reps each; thoracic extension over 1/2 foam  roll x10 reps at 3 segments between T3-T9         Balance Exercises - 09/27/16 1100      Balance Exercises: Standing   Tandem Gait Forward;Retro;Intermittent upper extremity support;1 rep  64f in hallway   Sidestepping Foam/compliant support;1 rep  in hallway               PT Education - 09/27/16 1116    Education provided Yes   Education Details exercise technique   Person(s) Educated Patient   Methods Explanation;Demonstration;Tactile cues;Verbal cues   Comprehension Verbalized understanding;Need further instruction          PT Short Term Goals - 09/10/16 1054      PT SHORT TERM GOAL #1   Title Patient to verbalize the importance of avoiding bent/flexed positions of her back to assist in preventing further  developmet of compression fractures    Baseline 8/14- educted in depth today    Time 1   Period Weeks   Status On-going     PT SHORT TERM GOAL #2   Title Patient to be able to maintain correct posture without external cues at least 70% of the time in order to show improved core strength and mechancis    Baseline 8/14- needs ongoing cues    Time 3   Period Weeks   Status On-going     PT SHORT TERM GOAL #3   Title Patient to be able to ambulate 5562fduring 3MWT to show improved functional activity tolerance and community access    Baseline 8/14- 452    Time 3   Period Weeks   Status On-going     PT SHORT TERM GOAL #4   Title Patient to be compliant with correct performance of HEP, to be updated PRN    Baseline 8/14- compliant    Time 1   Period Weeks   Status Achieved           PT Long Term Goals - 09/10/16 1057      PT LONG TERM GOAL #1   Title Patient to demonstrate functional strength as being 5/5 in order to improve mechanics and balance    Baseline 8/14- some improved    Time 6   Status Partially Met     PT LONG TERM GOAL #2   Title Patient to be able to complete TUG test in 12 seconds and maintain SLS for 30 seconds on solid surface in order to show improved functional balance/reduced fall risk    Baseline 8/14- TUG 14 seconds, SLS 2-3 seconds    Time 6   Period Weeks   Status On-going     PT LONG TERM GOAL #3   Title Patient to be independent in modifying functional tasks and activities to avoid flexed posture of spine in order to show self-efficacy of managing condition    Baseline 8/14- onoging    Time 6   Period Weeks   Status On-going     PT LONG TERM GOAL #4   Title Patient to report she has been able to return to 75% of her PLOF based activities without pain exacerbation in order to faciliate return to prior level of function and exercise    Baseline 8/14- thrown back due to issues with blodo pressure, before this she was doing much better    Time  6   Period Weeks   Status On-going               Plan -  09/27/16 1116    Clinical Impression Statement Pt continues to report increased LBP stiffness upon entry. Progressed postural strengthening and balance activities this date. Pt required increased time to complete balance exercises and min cues to stay on task. Continue POC as planned.   Rehab Potential Good   Clinical Impairments Affecting Rehab Potential (+) high PLOF, motivated to participate in PT   PT Frequency 2x / week   PT Duration 3 weeks   PT Treatment/Interventions ADLs/Self Care Home Management;Biofeedback;DME Instruction;Gait training;Stair training;Functional mobility training;Therapeutic activities;Therapeutic exercise;Balance training;Neuromuscular re-education;Patient/family education;Manual techniques;Passive range of motion;Taping   PT Next Visit Plan continue working on posture and core, upright/extended posture. Continue working on bed mobility. Progress TB work. progress balance; add march on foam and march with pulldown   PT Home Exercise Plan Eval: use lumbar support, back extensions into chair, bridges, TA activation; 7/26 hip flexor stretch    Consulted and Agree with Plan of Care Patient      Patient will benefit from skilled therapeutic intervention in order to improve the following deficits and impairments:  Abnormal gait, Improper body mechanics, Decreased coordination, Decreased mobility, Postural dysfunction, Decreased activity tolerance, Decreased strength, Decreased balance, Difficulty walking  Visit Diagnosis: Acute bilateral low back pain without sciatica  Abnormal posture  Muscle weakness (generalized)  Unsteadiness on feet     Problem List Patient Active Problem List   Diagnosis Date Noted  . Closed compression fracture of L1 lumbar vertebra, with routine healing, subsequent encounter 07/03/2016  . Chest pain 12/07/2015  . Chest pain at rest 12/07/2015  . TIA (transient ischemic  attack) 08/04/2015  . Slurred speech 08/04/2015  . Accelerated hypertension 08/04/2015  . Right arm pain 08/04/2015  . Cerebrovascular disease 09/28/2011  . Thyroid nodule 09/28/2011  . Irritable bowel syndrome   . Gastroesophageal reflux disease   . Arteriosclerotic cardiovascular disease (ASCVD)   . Diabetes mellitus, type II ([place])   . Hypertension   . Hyperlipidemia   . Pulmonary emphysema ([place]) 05/10/2008  . BACK PAIN, CHRONIC 05/10/2008      Geraldine Solar PT, DPT  [facility] [address] [place], [place], [address] Phone: [phone]   Fax:  [phone]  Name: Sheila Myers MRN: [id number] Date of Birth: [birth year]

## 2016-10-01 ENCOUNTER — Ambulatory Visit (INDEPENDENT_AMBULATORY_CARE_PROVIDER_SITE_OTHER): Payer: Medicare Other | Admitting: Orthopaedic Surgery

## 2016-10-01 ENCOUNTER — Ambulatory Visit (HOSPITAL_COMMUNITY): Payer: Medicare Other | Attending: Pulmonary Disease | Admitting: Physical Therapy

## 2016-10-01 VITALS — BP 145/68 | HR 64 | Temp 96.8°F | Ht 63.0 in | Wt 125.0 lb

## 2016-10-01 DIAGNOSIS — G8929 Other chronic pain: Secondary | ICD-10-CM | POA: Diagnosis not present

## 2016-10-01 DIAGNOSIS — M545 Low back pain, unspecified: Secondary | ICD-10-CM

## 2016-10-01 DIAGNOSIS — R2689 Other abnormalities of gait and mobility: Secondary | ICD-10-CM

## 2016-10-01 DIAGNOSIS — R293 Abnormal posture: Secondary | ICD-10-CM

## 2016-10-01 DIAGNOSIS — R2681 Unsteadiness on feet: Secondary | ICD-10-CM | POA: Insufficient documentation

## 2016-10-01 DIAGNOSIS — M25561 Pain in right knee: Secondary | ICD-10-CM

## 2016-10-01 DIAGNOSIS — M6281 Muscle weakness (generalized): Secondary | ICD-10-CM | POA: Diagnosis not present

## 2016-10-01 NOTE — Progress Notes (Signed)
Patient ZO:XWRUE Virgilio Frees, female DOB:August 25, 1928, 83 y.o. AVW:[id number]  Chief Complaint  Patient presents with  . Follow-up    Right knee pain    HPI  ANNER BAITY is a 83 y.o. female who has balance problems and right knee pain.  She is improving steadily.  She has been to PT and is walking much better.  Her right knee is not hurting today.  She has no new trauma. HPI  Body mass index is 22.14 kg/m.  ROS  Review of Systems  HENT: Negative for congestion.   Respiratory: Positive for shortness of breath. Negative for cough.   Cardiovascular: Negative for chest pain and leg swelling.  Endocrine: Positive for cold intolerance.  Musculoskeletal: Positive for arthralgias and back pain.  Allergic/Immunologic: Positive for environmental allergies.    Past Medical History:  Diagnosis Date  . ASCVD (arteriosclerotic cardiovascular disease)    H/o PCI  . Asthma   . Chronic obstructive pulmonary disease (HCC)   . Degenerative joint disease    Right shoulder pain-mechanism unclear  . Depression   . Diabetes mellitus type II   . Gastroesophageal reflux disease    Diverticulosis, hemorrhoids with rectal bleeding,  . Glaucoma   . Hyperlipidemia   . Hypertension   . Irritable bowel syndrome    constipation alternating with diarrhea    Past Surgical History:  Procedure Laterality Date  . ABDOMINAL HYSTERECTOMY    . APPENDECTOMY    . CHOLECYSTECTOMY    . COLONOSCOPY  2007   Negative screening study except for diverticulosis    Family History  Problem Relation Age of Onset  . Aneurysm Mother        Cerebral  . Hypertension Mother     Social History Social History  Substance Use Topics  . Smoking status: Never Smoker  . Smokeless tobacco: Never Used  . Alcohol use No    Allergies  Allergen Reactions  . Peanut-Containing Drug Products Anaphylaxis, Itching and Rash    Allergy not confirmed-patient also mentions a rash and itching when eating peanut butter   . Catapres [Clonidine Hcl] Other (See Comments)    Dry mouth  . Codeine Other (See Comments)    "I just freak out"  . Morphine Other (See Comments)    "I just freak out"    Current Outpatient Prescriptions  Medication Sig Dispense Refill  . albuterol (PROVENTIL HFA;VENTOLIN HFA) 108 (90 Base) MCG/ACT inhaler Inhale 2 puffs into the lungs 4 (four) times daily.    Marland Kitchen alendronate (FOSAMAX) 70 MG tablet TK 1 Tabletby mouth every 7 DAYS  10  . ALPRAZolam (XANAX) 0.5 MG tablet Take 0.5 mg by mouth 3 (three) times daily as needed for anxiety.     Marland Kitchen amLODipine-benazepril (LOTREL) 10-20 MG per capsule Take 1 capsule by mouth daily. 90 capsule 1  . aspirin 325 MG tablet Take 1 tablet (325 mg total) by mouth daily. 30 tablet 12  . bimatoprost (LUMIGAN) 0.03 % ophthalmic drops Place 1 drop into both eyes at bedtime.      Marland Kitchen dextromethorphan-guaiFENesin (MUCINEX DM) 30-600 MG 12hr tablet Take 1 tablet by mouth 2 (two) times daily as needed for cough.     Marland Kitchen HYDROcodone-acetaminophen (NORCO/VICODIN) 5-325 MG tablet One tablet every four hours as needed for acute pain.  Limit of five days per [place]. 30 tablet 0  . ipratropium-albuterol (DUONEB) 0.5-2.5 (3) MG/3ML SOLN Inhale 3 mLs into the lungs every 6 (six) hours as needed. Shortness of breath/wheezing    .  lidocaine (XYLOCAINE) 5 % ointment Apply 1 application topically daily. To legs and feet    . pravastatin (PRAVACHOL) 40 MG tablet Take 40 mg by mouth daily.  0   No current facility-administered medications for this visit.      Physical Exam  Blood pressure (!) 145/68, pulse 64, temperature (!) 96.8 F (36 C), height 5\' 3"  (1.6 m), weight 125 lb (56.7 kg).  Constitutional: overall normal hygiene, normal nutrition, well developed, normal grooming, normal body habitus. Assistive device:cane  Musculoskeletal: gait and station Limp none, muscle tone and strength are normal, no tremors or atrophy is present.  .  Neurological:  coordination overall normal.  Deep tendon reflex/nerve stretch intact.  Sensation normal.  Cranial nerves II-XII intact.   Skin:   Normal overall no scars, lesions, ulcers or rashes. No psoriasis.  Psychiatric: Alert and oriented x 3.  Recent memory intact, remote memory unclear.  Normal mood and affect. Well groomed.  Good eye contact.  Cardiovascular: overall no swelling, no varicosities, no edema bilaterally, normal temperatures of the legs and arms, no clubbing, cyanosis and good capillary refill.  Lymphatic: palpation is normal.  Right knee with only slight tenderness and crepitus and no effusion today. ROM is 0 to 110.  She is stable with her walking and uses the cane only slightly.  She has no limp today.  NV intact.  The patient has been educated about the nature of the problem(s) and counseled on treatment options.  The patient appeared to understand what I have discussed and is in agreement with it.  Encounter Diagnoses  Name Primary?  . Balance problem Yes  . Chronic pain of right knee     PLAN Call if any problems.  Precautions discussed.  Continue current medications.   Return to clinic 6 weeks   Electronically Signed Darreld McleanWayne Raysa Bosak, MD 9/4/20189:49 AM

## 2016-10-01 NOTE — Patient Instructions (Signed)
RETURN TO THE GYM  1) Start on the bicycle- this machine is probably the safest place for you to start especially since you are having problems with your balance.  2) The arm bicycle could also be good for you to go back to- it will be much better for you to do this BACKWARDS to help with your posture however.  3) Consider water exercise- the [facility] has a group class/exercise group that could benefit you.

## 2016-10-01 NOTE — Therapy (Signed)
[facility] [address] [place], [place], [address] Phone: [phone]   Fax:  [phone]  Physical Therapy Treatment (Re-Assessment)  Patient Details  Name: Sheila Myers MRN: [id number] Date of Birth: 11/14/1928 Referring Provider: Darreld Mclean   Encounter Date: 10/01/2016      PT End of Session - 10/01/16 1121    Visit Number 12   Number of Visits 16   Date for PT Re-Evaluation 10/16/16   Authorization Type UHC Medicare/Medicaid (G-codes done 06/11/22 session)   Authorization Time Period 08/13/16 to 09/24/16; recert done 8/13; recert done 9/5   Authorization - Visit Number 12   Authorization - Number of Visits 22   PT Start Time 1031   PT Stop Time 1111   PT Time Calculation (min) 40 min   Activity Tolerance Patient tolerated treatment well   Behavior During Therapy [facility] for tasks assessed/performed      Past Medical History:  Diagnosis Date  . ASCVD (arteriosclerotic cardiovascular disease)    H/o PCI  . Asthma   . Chronic obstructive pulmonary disease (HCC)   . Degenerative joint disease    Right shoulder pain-mechanism unclear  . Depression   . Diabetes mellitus type II   . Gastroesophageal reflux disease    Diverticulosis, hemorrhoids with rectal bleeding,  . Glaucoma   . Hyperlipidemia   . Hypertension   . Irritable bowel syndrome    constipation alternating with diarrhea    Past Surgical History:  Procedure Laterality Date  . ABDOMINAL HYSTERECTOMY    . APPENDECTOMY    . CHOLECYSTECTOMY    . COLONOSCOPY  2007   Negative screening study except for diverticulosis    There were no vitals filed for this visit.      Subjective Assessment - 10/01/16 1032    Subjective patient arrives stating she is doing well but she still feels wobbly; her BP is much better and she says her BP was 140s/60s at the MD just now. She has a hard time walking but she thinks this is "due to the air". She rates herself as being  about 75% right now with the remaining 25% being things that are inconsistent day by day.    Pertinent History HTN, hx TIA, pulmonary emphysema, DM, chronic LBP, COPD    How long can you sit comfortably? 9/4- 30-40 minutes    How long can you stand comfortably? 9/4- 15 minutes    How long can you walk comfortably? 9/4- 10 minutes maybe    Patient Stated Goals get active again, get back to gym    Currently in Pain? No/denies            [facility] PT Assessment - 10/01/16 0001      Assessment   Medical Diagnosis L1 compression fracture    Referring Provider Darreld Mclean    Onset Date/Surgical Date --  march 2018   Next MD Visit Dr. Hilda Lias in 6 weeks    Prior Therapy none for her back      Precautions   Precautions Back;Fall     Restrictions   Weight Bearing Restrictions No     Balance Screen   Has the patient fallen in the past 6 months No   Has the patient had a decrease in activity level because of a fear of falling?  No   Is the patient reluctant to leave their home because of a fear of falling?  No     Prior Function  Level of Independence Independent;Independent with basic ADLs;Independent with gait;Independent with transfers   Vocation Retired   Leisure very active, gym     Strength   Right Hip Flexion 5/5   Right Hip Extension 2/5   Right Hip ABduction 3/5   Left Hip Flexion 4+/5   Left Hip Extension 2/5   Left Hip ABduction 3/5   Right Knee Flexion 4/5   Right Knee Extension 5/5   Left Knee Flexion 4/5   Left Knee Extension 5/5   Right Ankle Dorsiflexion 5/5  difficulty following cues for MMT    Left Ankle Dorsiflexion --  difficulty following MMT cues      6 minute walk test results    Aerobic Endurance Distance Walked 452   Endurance additional comments      Timed Up and Go Test   Normal TUG (seconds) 13   TUG Comments no device      High Level Balance   High Level Balance Comments SLS 1-3 seconds B                            Balance Exercises - 10/01/16 1118      Balance Exercises: Standing   Tandem Stance Eyes open;3 reps;15 secs;Other (comment)  solid surface    Retro Gait 3 reps;Other (comment)  inside // bars, mod cues/min guard            PT Education - 10/01/16 1119    Education provided Yes   Education Details progress with skilled PT services and towards goals, discussed return to gym and activity (see instructions section), POC moving forward, importance of proactive approach towards  fall prevention in the elderly    Person(s) Educated Patient   Methods Explanation;Handout   Comprehension Verbalized understanding;Need further instruction          PT Short Term Goals - 10/01/16 1052      PT SHORT TERM GOAL #1   Title Patient to verbalize the importance of avoiding bent/flexed positions of her back to assist in preventing further developmet of compression fractures    Baseline 9/4- requires ongoing education    Time 1   Period Weeks   Status On-going     PT SHORT TERM GOAL #2   Title Patient to be able to maintain correct posture without external cues at least 70% of the time in order to show improved core strength and mechancis    Baseline 9/4- ongoing problem, no major improvement    Time 3   Period Weeks   Status On-going     PT SHORT TERM GOAL #3   Title Patient to be able to ambulate 539ft during to show improved functional activity tolerance and community access    Baseline 9/4- 474ft    Time 3   Period Weeks   Status On-going     PT SHORT TERM GOAL #4   Title Patient to be compliant with correct performance of HEP, to be updated PRN    Baseline 9/4- HEP x1/day per patient    Time 1   Period Weeks   Status Achieved           PT Long Term Goals - 10/01/16 1054      PT LONG TERM GOAL #1   Title Patient to demonstrate functional strength as being 5/5 in order to improve mechanics and balance    Baseline 9/4- functionally weak    Time 6  Period Weeks    Status On-going     PT LONG TERM GOAL #2   Title Patient to be able to complete TUG test in 12 seconds and maintain SLS for 30 seconds on solid surface in order to show improved functional balance/reduced fall risk    Baseline 9/4- TUG 13, SLS 1-3 seconds    Time 6   Period Weeks   Status On-going     PT LONG TERM GOAL #3   Title Patient to be independent in modifying functional tasks and activities to avoid flexed posture of spine in order to show self-efficacy of managing condition    Baseline 9/4- ongoing    Time 6   Period Weeks   Status On-going     PT LONG TERM GOAL #4   Title Patient to report she has been able to return to 75% of her PLOF based activities without pain exacerbation in order to faciliate return to prior level of function and exercise    Baseline 9/4- she is not depending on her cane, unsure otherwise    Time 6   Period Weeks   Status On-going               Plan - 10/01/16 1123    Clinical Impression Statement Re-assessment performed today. Patient does not appear to have made major progress towards her goals or in objective improvement, however of concern is her balance- noted patient frequently bumping into objects around her as well as posterior LOB at end of session requiring Min assist from PT to correct/prevent potential fall. Discussed progress thus far with PT as well as importance of her returning to gym, mostly on LE bikes and UBE given her current balance deficits, also discussed possible water exercise. Due to significant fall risk at this time, recommend short extension of skilled PT services with strict focus on balance and safety education to reduce fall risk before likely DC moving forward.    Rehab Potential Good   PT Frequency 2x / week   PT Duration 2 weeks   PT Treatment/Interventions ADLs/Self Care Home Management;Biofeedback;DME Instruction;Gait training;Stair training;Functional mobility training;Therapeutic activities;Therapeutic  exercise;Balance training;Neuromuscular re-education;Patient/family education;Manual techniques;Passive range of motion;Taping   PT Next Visit Plan strict focus on balance, safety education and fall prevention moving forward. DC in 4 sessions. Update HEP.    PT Home Exercise Plan Eval: use lumbar support, back extensions into chair, bridges, TA activation; 7/26 hip flexor stretch    Consulted and Agree with Plan of Care Patient      Patient will benefit from skilled therapeutic intervention in order to improve the following deficits and impairments:  Abnormal gait, Improper body mechanics, Decreased coordination, Decreased mobility, Postural dysfunction, Decreased activity tolerance, Decreased strength, Decreased balance, Difficulty walking  Visit Diagnosis: Acute bilateral low back pain without sciatica - Plan: PT plan of care cert/re-cert  Abnormal posture - Plan: PT plan of care cert/re-cert  Muscle weakness (generalized) - Plan: PT plan of care cert/re-cert  Unsteadiness on feet - Plan: PT plan of care cert/re-cert       G-Codes - 10/01/16 1124    Functional Assessment Tool Used (Outpatient Only) Based on skilled clinical assessment of posture, strenght, balance, safety awareness    Functional Limitation Mobility: Walking and moving around   Mobility: Walking and Moving Around Current Status ([id number]) At least 40 percent but less than 60 percent impaired, limited or restricted   Mobility: Walking and Moving Around Goal Status ([id number]) At least 20 percent  but less than 40 percent impaired, limited or restricted      Problem List Patient Active Problem List   Diagnosis Date Noted  . Closed compression fracture of L1 lumbar vertebra, with routine healing, subsequent encounter 07/03/2016  . Chest pain 12/07/2015  . Chest pain at rest 12/07/2015  . TIA (transient ischemic attack) 08/04/2015  . Slurred speech 08/04/2015  . Accelerated hypertension 08/04/2015  . Right arm pain  08/04/2015  . Cerebrovascular disease 09/28/2011  . Thyroid nodule 09/28/2011  . Irritable bowel syndrome   . Gastroesophageal reflux disease   . Arteriosclerotic cardiovascular disease (ASCVD)   . Diabetes mellitus, type II (HCC)   . Hypertension   . Hyperlipidemia   . Pulmonary emphysema (HCC) 05/10/2008  . BACK PAIN, CHRONIC 05/10/2008    Nedra Hai PT, DPT [phone]  [facility] [address] [place], [place], [address] Phone: [phone]   Fax:  [phone]  Name: Sheila Myers MRN: [id number] Date of Birth: March 26, 1928

## 2016-10-03 ENCOUNTER — Ambulatory Visit (HOSPITAL_COMMUNITY): Payer: Medicare Other

## 2016-10-03 DIAGNOSIS — M545 Low back pain, unspecified: Secondary | ICD-10-CM

## 2016-10-03 DIAGNOSIS — R293 Abnormal posture: Secondary | ICD-10-CM | POA: Diagnosis not present

## 2016-10-03 DIAGNOSIS — R2681 Unsteadiness on feet: Secondary | ICD-10-CM | POA: Diagnosis not present

## 2016-10-03 DIAGNOSIS — M6281 Muscle weakness (generalized): Secondary | ICD-10-CM | POA: Diagnosis not present
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
## 2016-10-09 ENCOUNTER — Ambulatory Visit (HOSPITAL_COMMUNITY): Payer: Medicare Other

## 2016-10-09 ENCOUNTER — Encounter (HOSPITAL_COMMUNITY): Payer: Self-pay

## 2016-10-09 DIAGNOSIS — M545 Low back pain, unspecified: Secondary | ICD-10-CM

## 2016-10-09 DIAGNOSIS — R2681 Unsteadiness on feet: Secondary | ICD-10-CM | POA: Diagnosis not present

## 2016-10-09 DIAGNOSIS — M6281 Muscle weakness (generalized): Secondary | ICD-10-CM

## 2016-10-09 DIAGNOSIS — R293 Abnormal posture: Secondary | ICD-10-CM | POA: Diagnosis not present

## 2016-10-09 NOTE — Therapy (Signed)
[facility] [address] [place], [place], [address] Phone: [phone]   Fax:  [phone]  Physical Therapy Treatment  Patient Details  Name: Sheila Myers MRN: [id number] Date of Birth: 01-08-1929 Referring Provider: Darreld McleanWayne Keeling   Encounter Date: 10/09/2016      PT End of Session - 10/09/16 1032    Visit Number 14   Number of Visits 16   Date for PT Re-Evaluation 10/16/16   Authorization Type UHC Medicare/Medicaid (G-codes done 12th session)   Authorization Time Period 08/13/16 to 09/24/16; recert done 8/13; recert done 9/5   Authorization - Visit Number 14   Authorization - Number of Visits 22   PT Start Time 1031   PT Stop Time 1110   PT Time Calculation (min) 39 min   Activity Tolerance Patient tolerated treatment well;No increased pain   Behavior During Therapy WFL for tasks assessed/performed      Past Medical History:  Diagnosis Date  . ASCVD (arteriosclerotic cardiovascular disease)    H/o PCI  . Asthma   . Chronic obstructive pulmonary disease (HCC)   . Degenerative joint disease    Right shoulder pain-mechanism unclear  . Depression   . Diabetes mellitus type II   . Gastroesophageal reflux disease    Diverticulosis, hemorrhoids with rectal bleeding,  . Glaucoma   . Hyperlipidemia   . Hypertension   . Irritable bowel syndrome    constipation alternating with diarrhea    Past Surgical History:  Procedure Laterality Date  . ABDOMINAL HYSTERECTOMY    . APPENDECTOMY    . CHOLECYSTECTOMY    . COLONOSCOPY  2007   Negative screening study except for diverticulosis    There were no vitals filed for this visit.      Subjective Assessment - 10/09/16 1032    Subjective Pt states that once she is up and moving she feels pretty good. She reports a little bit of tightness in her LLE, "muscle pains."   Patient Stated Goals get active again, get back to gym    Currently in Pain? Yes   Pain Score 7    Pain  Location Leg   Pain Orientation Left;Right   Pain Descriptors / Indicators Tightness   Pain Type Chronic pain   Pain Onset More than a month ago   Pain Frequency Intermittent   Aggravating Factors  standing for long periods of time, weather   Pain Relieving Factors pain medication   Effect of Pain on Daily Activities increased difficulty                Balance Exercises - 10/09/16 1036      Balance Exercises: Standing   Standing Eyes Closed Narrow base of support (BOS);Foam/compliant surface;5 reps;10 secs   Partial Tandem Stance Eyes open  front foot on dynadisc and palov press with RTB x15 reps eac   Step Over Hurdles / Cones side step over (4) 6" hurdles on balance beam in // bars x2RT   Marching Limitations march on foam 20x3" contralateral holds   Other Standing Exercises cone taps on foam x5RT; bil tandem stance on foam with UE flexion and trunk rotations with 3# wt bar x10 reps, x5 reps each              PT Education - 10/09/16 1110    Education provided Yes   Education Details exercise technique   Person(s) Educated Patient   Methods Explanation;Demonstration   Comprehension Verbalized understanding;Need further instruction  PT Short Term Goals - 10/01/16 1052      PT SHORT TERM GOAL #1   Title Patient to verbalize the importance of avoiding bent/flexed positions of her back to assist in preventing further developmet of compression fractures    Baseline 9/4- requires ongoing education    Time 1   Period Weeks   Status On-going     PT SHORT TERM GOAL #2   Title Patient to be able to maintain correct posture without external cues at least 70% of the time in order to show improved core strength and mechancis    Baseline 9/4- ongoing problem, no major improvement    Time 3   Period Weeks   Status On-going     PT SHORT TERM GOAL #3   Title Patient to be able to ambulate 547ft during to show improved functional activity tolerance and  community access    Baseline 9/4- 4102ft    Time 3   Period Weeks   Status On-going     PT SHORT TERM GOAL #4   Title Patient to be compliant with correct performance of HEP, to be updated PRN    Baseline 9/4- HEP x1/day per patient    Time 1   Period Weeks   Status Achieved           PT Long Term Goals - 10/01/16 1054      PT LONG TERM GOAL #1   Title Patient to demonstrate functional strength as being 5/5 in order to improve mechanics and balance    Baseline 9/4- functionally weak    Time 6   Period Weeks   Status On-going     PT LONG TERM GOAL #2   Title Patient to be able to complete TUG test in 12 seconds and maintain SLS for 30 seconds on solid surface in order to show improved functional balance/reduced fall risk    Baseline 9/4- TUG 13, SLS 1-3 seconds    Time 6   Period Weeks   Status On-going     PT LONG TERM GOAL #3   Title Patient to be independent in modifying functional tasks and activities to avoid flexed posture of spine in order to show self-efficacy of managing condition    Baseline 9/4- ongoing    Time 6   Period Weeks   Status On-going     PT LONG TERM GOAL #4   Title Patient to report she has been able to return to 75% of her PLOF based activities without pain exacerbation in order to faciliate return to prior level of function and exercise    Baseline 9/4- she is not depending on her cane, unsure otherwise    Time 6   Period Weeks   Status On-going               Plan - 10/09/16 1111    Clinical Impression Statement Per POC, session focused on balance activities. Pt did well throughout session but was challenged by dynamic activities. Supervision to min a required throughout session. She did require increased time to complete tasks this date. Continue POC as planned.   Rehab Potential Good   Clinical Impairments Affecting Rehab Potential (+) high PLOF, motivated to participate in PT   PT Frequency 2x / week   PT Duration 2 weeks   PT  Treatment/Interventions ADLs/Self Care Home Management;Biofeedback;DME Instruction;Gait training;Stair training;Functional mobility training;Therapeutic activities;Therapeutic exercise;Balance training;Neuromuscular re-education;Patient/family education;Manual techniques;Passive range of motion;Taping   PT Next Visit Plan strict  focus on balance, safety education and fall prevention moving forward. DC in 3 sessions. Update HEP.    PT Home Exercise Plan Eval: use lumbar support, back extensions into chair, bridges, TA activation; 7/26 hip flexor stretch    Consulted and Agree with Plan of Care Patient      Patient will benefit from skilled therapeutic intervention in order to improve the following deficits and impairments:  Abnormal gait, Improper body mechanics, Decreased coordination, Decreased mobility, Postural dysfunction, Decreased activity tolerance, Decreased strength, Decreased balance, Difficulty walking  Visit Diagnosis: Acute bilateral low back pain without sciatica  Abnormal posture  Muscle weakness (generalized)  Unsteadiness on feet     Problem List Patient Active Problem List   Diagnosis Date Noted  . Closed compression fracture of L1 lumbar vertebra, with routine healing, subsequent encounter 07/03/2016  . Chest pain 12/07/2015  . Chest pain at rest 12/07/2015  . TIA (transient ischemic attack) 08/04/2015  . Slurred speech 08/04/2015  . Accelerated hypertension 08/04/2015  . Right arm pain 08/04/2015  . Cerebrovascular disease 09/28/2011  . Thyroid nodule 09/28/2011  . Irritable bowel syndrome   . Gastroesophageal reflux disease   . Arteriosclerotic cardiovascular disease (ASCVD)   . Diabetes mellitus, type II (HCC)   . Hypertension   . Hyperlipidemia   . Pulmonary emphysema (HCC) 05/10/2008  . BACK PAIN, CHRONIC 05/10/2008       Jac Canavan PT, DPT   [facility] [address] [place], [place],  [address] Phone: [phone]   Fax:  [phone]  Name: Sheila Myers MRN: [id number] Date of Birth: 25-Jun-1928

## 2016-10-11 ENCOUNTER — Ambulatory Visit (HOSPITAL_COMMUNITY): Payer: Medicare Other

## 2016-10-11 ENCOUNTER — Telehealth (HOSPITAL_COMMUNITY): Payer: Self-pay

## 2016-10-11 NOTE — Telephone Encounter (Signed)
No show; PT attempted to call pt regarding missed appointment but there was no answer and no machine to leave a message.   Jac Canavan PT, DPT

## 2016-10-16 ENCOUNTER — Ambulatory Visit (HOSPITAL_COMMUNITY): Payer: Medicare Other

## 2016-10-16 DIAGNOSIS — M6281 Muscle weakness (generalized): Secondary | ICD-10-CM

## 2016-10-16 DIAGNOSIS — R293 Abnormal posture: Secondary | ICD-10-CM | POA: Diagnosis not present

## 2016-10-16 DIAGNOSIS — M545 Low back pain, unspecified: Secondary | ICD-10-CM

## 2016-10-16 DIAGNOSIS — R2681 Unsteadiness on feet: Secondary | ICD-10-CM | POA: Diagnosis not present
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
## 2016-10-18 ENCOUNTER — Ambulatory Visit (HOSPITAL_COMMUNITY): Payer: Medicare Other

## 2016-11-12 ENCOUNTER — Ambulatory Visit: Payer: Medicare Other | Admitting: Orthopaedic Surgery

## 2016-11-18 DIAGNOSIS — Z0000 Encounter for general adult medical examination without abnormal findings: Secondary | ICD-10-CM | POA: Diagnosis not present

## 2016-11-18 DIAGNOSIS — J309 Allergic rhinitis, unspecified: Secondary | ICD-10-CM | POA: Diagnosis not present

## 2016-11-18 DIAGNOSIS — Z23 Encounter for immunization: Secondary | ICD-10-CM | POA: Diagnosis not present

## 2016-11-19 DIAGNOSIS — E785 Hyperlipidemia, unspecified: Secondary | ICD-10-CM | POA: Diagnosis not present

## 2016-11-19 DIAGNOSIS — I25119 Atherosclerotic heart disease of native coronary artery with unspecified angina pectoris: Secondary | ICD-10-CM | POA: Diagnosis not present

## 2016-11-19 DIAGNOSIS — I10 Essential (primary) hypertension: Secondary | ICD-10-CM | POA: Diagnosis not present

## 2016-11-19 DIAGNOSIS — E1165 Type 2 diabetes mellitus with hyperglycemia: Secondary | ICD-10-CM | POA: Diagnosis not present

## 2017-02-18 DIAGNOSIS — E785 Hyperlipidemia, unspecified: Secondary | ICD-10-CM | POA: Diagnosis not present

## 2017-02-18 DIAGNOSIS — J449 Chronic obstructive pulmonary disease, unspecified: Secondary | ICD-10-CM | POA: Diagnosis not present

## 2017-02-18 DIAGNOSIS — M159 Polyosteoarthritis, unspecified: Secondary | ICD-10-CM | POA: Diagnosis not present

## 2017-02-18 DIAGNOSIS — I10 Essential (primary) hypertension: Secondary | ICD-10-CM | POA: Diagnosis not present

## 2017-03-21 DIAGNOSIS — Z79891 Long term (current) use of opiate analgesic: Secondary | ICD-10-CM | POA: Diagnosis not present

## 2017-04-25 DIAGNOSIS — Z79891 Long term (current) use of opiate analgesic: Secondary | ICD-10-CM | POA: Diagnosis not present

## 2017-05-02 DIAGNOSIS — I10 Essential (primary) hypertension: Secondary | ICD-10-CM | POA: Diagnosis not present

## 2017-05-02 DIAGNOSIS — M159 Polyosteoarthritis, unspecified: Secondary | ICD-10-CM | POA: Diagnosis not present

## 2017-05-02 DIAGNOSIS — I25119 Atherosclerotic heart disease of native coronary artery with unspecified angina pectoris: Secondary | ICD-10-CM | POA: Diagnosis not present

## 2017-05-02 DIAGNOSIS — J449 Chronic obstructive pulmonary disease, unspecified: Secondary | ICD-10-CM | POA: Diagnosis not present

## 2017-05-20 ENCOUNTER — Other Ambulatory Visit (HOSPITAL_COMMUNITY): Payer: Self-pay | Admitting: Pulmonary Disease

## 2017-05-20 DIAGNOSIS — R1314 Dysphagia, pharyngoesophageal phase: Secondary | ICD-10-CM | POA: Diagnosis not present

## 2017-05-20 DIAGNOSIS — M159 Polyosteoarthritis, unspecified: Secondary | ICD-10-CM | POA: Diagnosis not present

## 2017-05-20 DIAGNOSIS — R1310 Dysphagia, unspecified: Secondary | ICD-10-CM

## 2017-05-20 DIAGNOSIS — J449 Chronic obstructive pulmonary disease, unspecified: Secondary | ICD-10-CM | POA: Diagnosis not present

## 2017-05-20 DIAGNOSIS — I25119 Atherosclerotic heart disease of native coronary artery with unspecified angina pectoris: Secondary | ICD-10-CM | POA: Diagnosis not present

## 2017-05-21 ENCOUNTER — Encounter: Payer: Self-pay | Admitting: Gastroenterology

## 2017-05-23 ENCOUNTER — Ambulatory Visit (HOSPITAL_COMMUNITY)
Admission: RE | Admit: 2017-05-23 | Discharge: 2017-05-23 | Disposition: A | Discharge status: Home / Self Care | Payer: Medicare Other | Source: Ambulatory Visit | Attending: Pulmonary Disease | Admitting: Pulmonary Disease

## 2017-05-23 DIAGNOSIS — K224 Dyskinesia of esophagus: Secondary | ICD-10-CM | POA: Diagnosis not present

## 2017-05-23 DIAGNOSIS — K225 Diverticulum of esophagus, acquired: Secondary | ICD-10-CM | POA: Insufficient documentation

## 2017-05-23 DIAGNOSIS — R1310 Dysphagia, unspecified: Secondary | ICD-10-CM | POA: Diagnosis not present

## 2017-06-19 ENCOUNTER — Other Ambulatory Visit: Payer: Self-pay | Admitting: *Deleted

## 2017-06-19 ENCOUNTER — Encounter: Payer: Self-pay | Admitting: *Deleted

## 2017-06-19 ENCOUNTER — Encounter: Payer: Self-pay | Admitting: Gastroenterology

## 2017-06-19 ENCOUNTER — Telehealth: Payer: Self-pay | Admitting: *Deleted

## 2017-06-19 ENCOUNTER — Ambulatory Visit (INDEPENDENT_AMBULATORY_CARE_PROVIDER_SITE_OTHER): Payer: Medicare Other | Admitting: Gastroenterology

## 2017-06-19 VITALS — BP 166/88 | HR 74 | Temp 97.6°F | Ht 63.0 in | Wt 128.0 lb

## 2017-06-19 DIAGNOSIS — R1310 Dysphagia, unspecified: Secondary | ICD-10-CM

## 2017-06-19 DIAGNOSIS — K219 Gastro-esophageal reflux disease without esophagitis: Secondary | ICD-10-CM | POA: Diagnosis not present

## 2017-06-19 DIAGNOSIS — R1013 Epigastric pain: Secondary | ICD-10-CM

## 2017-06-19 DIAGNOSIS — R1319 Other dysphagia: Secondary | ICD-10-CM

## 2017-06-19 MED ORDER — ESOMEPRAZOLE MAGNESIUM 40 MG PO CPDR: 40.0000 mg | DELAYED_RELEASE_CAPSULE | Freq: Two times a day (BID) | ORAL | 5 refills | Status: DC

## 2017-06-19 NOTE — Patient Instructions (Signed)
1. Stop pantoprazole. Start Nexium once capsule before breakfast and one before evening meal. Once we get your symptoms under control we will try to back you down to once daily only. 2. Upper endoscopy as scheduled. See separate instructions.

## 2017-06-19 NOTE — Telephone Encounter (Signed)
Pre-op scheduled for 08/19/17 at 2:15pm. Letter mailed. Called pt, NA.

## 2017-06-19 NOTE — Assessment & Plan Note (Signed)
84 year old female presenting with several month history of refractory reflux.  Typically better controlled on Nexium but had to be switched 2 years ago due to insurance no longer covering it.  Has been on pantoprazole twice daily for 1 year but continues to have frequent heartburn, water brash and complains of discomfort in the substernal region/epigastrium with meals.  Describes sensation of fullness, things not going down or not digesting.  Barium esophagram recently without evidence of stricture however did have some esophageal dysmotility and retrograde peristalsis.  Due to poorly controlled GERD, epigastric discomfort I have offered her an upper endoscopy for further evaluation of her symptoms.  Plan for deep sedation with propofol given polypharmacy.  I have discussed the risks, alternatives, benefits with regards to but not limited to the risk of reaction to medication, bleeding, infection, perforation and the patient is agreeable to proceed. Written consent to be obtained.  Of note she had a tiny Zenker's diverticulum seen on barium pill esophagram.  Denies any associated symptoms.  Stop pantoprazole, begin Nexium 40 mg twice daily before meals with hopes to decreasing to once daily dosing once her symptoms are better controlled.  It appears that Nexium is now on her formulary.

## 2017-06-19 NOTE — Progress Notes (Signed)
CC'D TO PCP °

## 2017-06-19 NOTE — Progress Notes (Signed)
Primary Care Physician:  Kari Baars, MD  Primary Gastroenterologist:  Jonette Eva, MD   Chief Complaint  Patient presents with  . Gastroesophageal Reflux    HPI:  Sheila Myers is a 84 y.o. female here for further evaluation dysphagia.  She had a barium pill esophagram on April 26 showing age-related esophageal dysmotility with incomplete clearance of barium by primary peristaltic waves and note of scattered secondary and tertiary waves as well as intermittent retrograde peristalsis.  No esophageal mass or stricture.  Tiny Zenker's diverticulum.  Patient reports she did very well on Nexium  BID but in 2017 she could no longer get insurance to cover. Went three or four months without PPI. Dr. [name] put her on pantoprazole BID about a year ago. Has never felt like her reflux was controlled. Several days a week she has belching and water brash. No vomiting. She complains of feeling a fullness and discomfort in substernal region/epigastric region with meals. Worse last year when she was in a full brace for spinal fracture. No pill dysphagia. BM regular. No melena, brbpr. No reproduction of symptoms with BPE. Denies weight loss. She has modified her diet, very bland but still with issues with her GERD and fullness in epigastrium.    Current Outpatient Medications  Medication Sig Dispense Refill  . albuterol (PROVENTIL HFA;VENTOLIN HFA) 108 (90 Base) MCG/ACT inhaler Inhale 2 puffs into the lungs 4 (four) times daily.    Marland Kitchen alendronate (FOSAMAX) 70 MG tablet TK 1 Tabletby mouth every 7 DAYS  10  . ALPRAZolam (XANAX) 0.5 MG tablet Take 0.5 mg by mouth 3 (three) times daily as needed for anxiety. Very seldom    . amLODipine-benazepril (LOTREL) 10-20 MG per capsule Take 1 capsule by mouth daily. 90 capsule 1  . aspirin 325 MG tablet Take 1 tablet (325 mg total) by mouth daily. 30 tablet 12  . bimatoprost (LUMIGAN) 0.03 % ophthalmic drops Place 1 drop into both eyes at bedtime.      Marland Kitchen  HYDROcodone-acetaminophen (NORCO/VICODIN) 5-325 MG tablet One tablet every four hours as needed for acute pain.  Limit of five days per [place] statue. 30 tablet 0  . ibuprofen (ADVIL,MOTRIN) 800 MG tablet Take 800 mg by mouth every 8 (eight) hours as needed. Only as needed every 8 hours    . ipratropium-albuterol (DUONEB) 0.5-2.5 (3) MG/3ML SOLN Inhale 3 mLs into the lungs every 6 (six) hours as needed. Shortness of breath/wheezing    . lidocaine (XYLOCAINE) 5 % ointment Apply 1 application topically daily. To legs and feet    . pantoprazole (PROTONIX) 40 MG tablet Take 40 mg by mouth 2 (two) times daily.    . pravastatin (PRAVACHOL) 40 MG tablet Take 40 mg by mouth daily.  0   No current facility-administered medications for this visit.     Allergies as of 06/19/2017 - Review Complete 06/19/2017  Allergen Reaction Noted  . Peanut-containing drug products Anaphylaxis, Itching, and Rash 03/31/2008  . Catapres [clonidine hcl] Other (See Comments) 09/16/2011  . Codeine Other (See Comments) 05/10/2008  . Morphine Other (See Comments) 05/10/2008    Past Medical History:  Diagnosis Date  . Anxiety   . ASCVD (arteriosclerotic cardiovascular disease)    H/o PCI  . Asthma   . Chronic back pain   . Chronic obstructive pulmonary disease ([facility])   . Degenerative joint disease    Right shoulder pain-mechanism unclear  . Depression   . Diabetes mellitus type II   . Gastroesophageal  reflux disease    Diverticulosis, hemorrhoids with rectal bleeding,  . Glaucoma   . Hyperlipidemia   . Hypertension   . Irritable bowel syndrome    constipation alternating with diarrhea    Past Surgical History:  Procedure Laterality Date  . ABDOMINAL HYSTERECTOMY    . APPENDECTOMY    . CHOLECYSTECTOMY    . COLONOSCOPY WITH ESOPHAGOGASTRODUODENOSCOPY (EGD)  2007   Dr. Darrick Penna: diverticulosis, external hemorrhoids, hiatal hernia, patent Schatzki ring.  . CORONARY ANGIOPLASTY WITH STENT PLACEMENT    .  ESOPHAGOGASTRODUODENOSCOPY  2012   Dr. Darrick Penna: Peptic stricture versus ring status post dilation, gastric inflammation and polyps,  biopsies unremarkable.    Family History  Problem Relation Age of Onset  . Aneurysm Mother        Cerebral  . Hypertension Mother   . Breast cancer Sister   . Breast cancer Sister   . Colon cancer Neg Hx     Social History   Socioeconomic History  . Marital status: Widowed    Spouse name: Not on file  . Number of children: Not on file  . Years of education: Not on file  . Highest education level: Not on file  Occupational History  . Occupation: Retired Lawyer    Comment: [employer]  Social Needs  . Financial resource strain: Not on file  . Food insecurity:    Worry: Not on file    Inability: Not on file  . Transportation needs:    Medical: Not on file    Non-medical: Not on file  Tobacco Use  . Smoking status: Never Smoker  . Smokeless tobacco: Never Used  Substance and Sexual Activity  . Alcohol use: No  . Drug use: No  . Sexual activity: Never  Lifestyle  . Physical activity:    Days per week: Not on file    Minutes per session: Not on file  . Stress: Not on file  Relationships  . Social connections:    Talks on phone: Not on file    Gets together: Not on file    Attends religious service: Not on file    Active member of club or organization: Not on file    Attends meetings of clubs or organizations: Not on file    Relationship status: Not on file  . Intimate partner violence:    Fear of current or ex partner: Not on file    Emotionally abused: Not on file    Physically abused: Not on file    Forced sexual activity: Not on file  Other Topics Concern  . Not on file  Social History Narrative  . Not on file      ROS:  General: Negative for anorexia, weight loss, fever, chills, fatigue, weakness. Eyes: Negative for vision changes.  ENT: Negative for hoarseness, difficulty swallowing , nasal congestion. CV:  Negative for chest pain, angina, palpitations, dyspnea on exertion, peripheral edema.  Respiratory: Negative for dyspnea at rest, dyspnea on exertion, cough, sputum, wheezing.  GI: See history of present illness. GU:  Negative for dysuria, hematuria, urinary incontinence, urinary frequency, nocturnal urination.  MS: chronic knee pain.  Derm: Negative for rash or itching.  Neuro: Negative for weakness, abnormal sensation, seizure, frequent headaches, memory loss, confusion.  Psych: Negative for anxiety, depression, suicidal ideation, hallucinations.  Endo: Negative for unusual weight change.  Heme: Negative for bruising or bleeding. Allergy: Negative for rash or hives.    Physical Examination:  BP (!) 166/88   Pulse  74   Temp 97.6 F (36.4 C) (Oral)   Ht  (1.6 m)   Wt 128 lb (58.1 kg)   BMI 22.67 kg/m    General: Well-nourished, well-developed in no acute distress.  Head: Normocephalic, atraumatic.   Eyes: Conjunctiva pink, no icterus. Mouth: Oropharyngeal mucosa moist and pink , no lesions erythema or exudate. Neck: Supple without thyromegaly, masses, or lymphadenopathy.  Lungs: Clear to auscultation bilaterally.  Heart: Regular rate and rhythm, no murmurs rubs or gallops.  Abdomen: Bowel sounds are normal, nontender, nondistended, no hepatosplenomegaly or masses, no abdominal bruits or    hernia , no rebound or guarding.   Rectal: not performed Extremities: No lower extremity edema. No clubbing or deformities.  Neuro: Alert and oriented x 4 , grossly normal neurologically.  Skin: Warm and dry, no rash or jaundice.   Psych: Alert and cooperative, normal mood and affect.  Labs: Lab Results  Component Value Date   CREATININE 0.99 09/09/2016   BUN 17 09/09/2016   NA 141 09/09/2016   K 3.5 09/09/2016   CL 105 09/09/2016   CO2 29 09/09/2016   Lab Results  Component Value Date   ALT 17 04/16/2016   AST 18 04/16/2016   ALKPHOS 58 04/16/2016   BILITOT 1.4 (H)  04/16/2016   Lab Results  Component Value Date   WBC 9.6 09/09/2016   HGB 13.7 09/09/2016   HCT 42.4 09/09/2016   MCV 72.1 (L) 09/09/2016   PLT 232 09/09/2016     Imaging Studies: Dg Esophagus  Result Date: 05/23/2017 CLINICAL DATA:  Dysphagia at mid thorax, recent thoracic spine fracture and wearing a brace EXAM: ESOPHOGRAM / BARIUM SWALLOW / BARIUM TABLET STUDY TECHNIQUE: Combined double contrast and single contrast examination performed using effervescent crystals, thick barium liquid, and thin barium liquid. The patient was observed with fluoroscopy swallowing a 13 mm barium sulphate tablet. FLUOROSCOPY TIME:  Fluoroscopy Time:  1 minutes 42 seconds Radiation Exposure Index (if provided by the fluoroscopic device): 25.9 mGy Number of Acquired Spot Images: multiple fluoroscopic screen captures COMPARISON:  None FINDINGS: Esophageal distention: Normal, without mass or stricture Filling defects:  None 12.5 mm barium tablet: Passed from oral cavity to stomach without obstruction Motility: Diffuse age-related esophageal dysmotility with incomplete clearance of barium by primary peristaltic waves and note of scattered secondary and tertiary waves as well as intermittent retrograde peristalsis. Mucosa:  Smooth without irregularity or ulceration Hypopharynx/cervical esophagus: No laryngeal penetration or aspiration. Small residuals in the vallecula which were spontaneously cleared by a second swallow. Tiny Zenker diverticulum. Hiatal hernia:  Not visualized GE reflux:  Not witnessed during exam Other:  N/A IMPRESSION: Age-related esophageal dysmotility without mass or stricture. Tiny Zenker diverticulum. Electronically Signed   By: Ulyses Southward M.D.   On: 05/23/2017 10:11

## 2017-08-04 DIAGNOSIS — G4700 Insomnia, unspecified: Secondary | ICD-10-CM | POA: Diagnosis not present

## 2017-08-04 DIAGNOSIS — J449 Chronic obstructive pulmonary disease, unspecified: Secondary | ICD-10-CM | POA: Diagnosis not present

## 2017-08-04 DIAGNOSIS — M545 Low back pain: Secondary | ICD-10-CM | POA: Diagnosis not present

## 2017-08-04 DIAGNOSIS — I10 Essential (primary) hypertension: Secondary | ICD-10-CM | POA: Diagnosis not present

## 2017-08-11 NOTE — Patient Instructions (Signed)
Sheila Myers  08/11/2017     @PREFPERIOPPHARMACY @   Your procedure is scheduled on  08/19/2017 .  Report to [facility] at  1:00  P.M.  Call this number if you have problems the morning of surgery:  [phone]   Remember:  Do not eat or drink after midnight.  You may drink clear liquids until (foloow the instructions given to you) .  Clear liquids allowed are:                    Water, Juice (non-citric and without pulp), Carbonated beverages, Clear Tea, Black Coffee only, Plain Jell-O only, Gatorade and Plain Popsicles only    Take these medicines the morning of surgery with A SIP OF WATER Amlopine, nexium, norco. Use your inhaler and your nebulizer before you come.    Do not wear jewelry, make-up or nail polish.  Do not wear lotions, powders, or perfumes, or deodorant.  Do not shave 48 hours prior to surgery.  Men may shave face and neck.  Do not bring valuables to the hospital.  [facility] is not responsible for any belongings or valuables.  Contacts, dentures or bridgework may not be worn into surgery.  Leave your suitcase in the car.  After surgery it may be brought to your room.  For patients admitted to the hospital, discharge time will be determined by your treatment team.  Patients discharged the day of surgery will not be allowed to drive home.   Name and phone number of your driver:   family Special instructions:  Follow the instructions given to you by Dr Evelina Dun office.  Please read over the following fact sheets that you were given. Anesthesia Post-op Instructions and Care and Recovery After Surgery       Esophagogastroduodenoscopy Esophagogastroduodenoscopy (EGD) is a procedure to examine the lining of the esophagus, stomach, and first part of the small intestine (duodenum). This procedure is done to check for problems such as inflammation, bleeding, ulcers, or growths. During this procedure, a long, flexible, lighted tube with a  camera attached (endoscope) is inserted down the throat. Tell a health care provider about:  Any allergies you have.  All medicines you are taking, including vitamins, herbs, eye drops, creams, and over-the-counter medicines.  Any problems you or family members have had with anesthetic medicines.  Any blood disorders you have.  Any surgeries you have had.  Any medical conditions you have.  Whether you are pregnant or may be pregnant. What are the risks? Generally, this is a safe procedure. However, problems may occur, including:  Infection.  Bleeding.  A tear (perforation) in the esophagus, stomach, or duodenum.  Trouble breathing.  Excessive sweating.  Spasms of the larynx.  A slowed heartbeat.  Low blood pressure.  What happens before the procedure?  Follow instructions from your health care provider about eating or drinking restrictions.  Ask your health care provider about: ? Changing or stopping your regular medicines. This is especially important if you are taking diabetes medicines or blood thinners. ? Taking medicines such as aspirin and ibuprofen. These medicines can thin your blood. Do not take these medicines before your procedure if your health care provider instructs you not to.  Plan to have someone take you home after the procedure.  If you wear dentures, be ready to remove them before the procedure. What happens during the procedure?  To reduce your risk of  infection, your health care team will wash or sanitize their hands.  An IV tube will be put in a vein in your hand or arm. You will get medicines and fluids through this tube.  You will be given one or more of the following: ? A medicine to help you relax (sedative). ? A medicine to numb the area (local anesthetic). This medicine may be sprayed into your throat. It will make you feel more comfortable and keep you from gagging or coughing during the procedure. ? A medicine for pain.  A mouth  guard may be placed in your mouth to protect your teeth and to keep you from biting on the endoscope.  You will be asked to lie on your left side.  The endoscope will be lowered down your throat into your esophagus, stomach, and duodenum.  Air will be put into the endoscope. This will help your health care provider see better.  The lining of your esophagus, stomach, and duodenum will be examined.  Your health care provider may: ? Take a tissue sample so it can be looked at in a lab (biopsy). ? Remove growths. ? Remove objects (foreign bodies) that are stuck. ? Treat any bleeding with medicines or other devices that stop tissue from bleeding. ? Widen (dilate) or stretch narrowed areas of your esophagus and stomach.  The endoscope will be taken out. The procedure may vary among health care providers and hospitals. What happens after the procedure?  Your blood pressure, heart rate, breathing rate, and blood oxygen level will be monitored often until the medicines you were given have worn off.  Do not eat or drink anything until the numbing medicine has worn off and your gag reflex has returned. This information is not intended to replace advice given to you by your health care provider. Make sure you discuss any questions you have with your health care provider. Document Released: 05/17/2004 Document Revised: 06/22/2015 Document Reviewed: 12/08/2014 Elsevier Interactive Patient Education  2018 [employer]. Esophagogastroduodenoscopy, Care After Refer to this sheet in the next few weeks. These instructions provide you with information about caring for yourself after your procedure. Your health care provider may also give you more specific instructions. Your treatment has been planned according to current medical practices, but problems sometimes occur. Call your health care provider if you have any problems or questions after your procedure. What can I expect after the procedure? After  the procedure, it is common to have:  A sore throat.  Nausea.  Bloating.  Dizziness.  Fatigue.  Follow these instructions at home:  Do not eat or drink anything until the numbing medicine (local anesthetic) has worn off and your gag reflex has returned. You will know that the local anesthetic has worn off when you can swallow comfortably.  Do not drive for 24 hours if you received a medicine to help you relax (sedative).  If your health care provider took a tissue sample for testing during the procedure, make sure to get your test results. This is your responsibility. Ask your health care provider or the department performing the test when your results will be ready.  Keep all follow-up visits as told by your health care provider. This is important. Contact a health care provider if:  You cannot stop coughing.  You are not urinating.  You are urinating less than usual. Get help right away if:  You have trouble swallowing.  You cannot eat or drink.  You have throat or chest pain  that gets worse.  You are dizzy or light-headed.  You faint.  You have nausea or vomiting.  You have chills.  You have a fever.  You have severe abdominal pain.  You have black, tarry, or bloody stools. This information is not intended to replace advice given to you by your health care provider. Make sure you discuss any questions you have with your health care provider. Document Released: 01/01/2012 Document Revised: 06/22/2015 Document Reviewed: 12/08/2014 Elsevier Interactive Patient Education  2018 [employer]  Esophageal Dilatation Esophageal dilatation is a procedure to open a blocked or narrowed part of the esophagus. The esophagus is the long tube in your throat that carries food and liquid from your mouth to your stomach. The procedure is also called esophageal dilation. You may need this procedure if you have a buildup of scar tissue in your esophagus that makes it difficult,  painful, or even impossible to swallow. This can be caused by gastroesophageal reflux disease (GERD). In rare cases, people need this procedure because they have cancer of the esophagus or a problem with the way food moves through the esophagus. Sometimes you may need to have another dilatation to enlarge the opening of the esophagus gradually. Tell a health care provider about:  Any allergies you have.  All medicines you are taking, including vitamins, herbs, eye drops, creams, and over-the-counter medicines.  Any problems you or family members have had with anesthetic medicines.  Any blood disorders you have.  Any surgeries you have had.  Any medical conditions you have.  Any antibiotic medicines you are required to take before dental procedures. What are the risks? Generally, this is a safe procedure. However, problems can occur and include:  Bleeding from a tear in the lining of the esophagus.  A hole (perforation) in the esophagus.  What happens before the procedure?  Do not eat or drink anything after midnight on the night before the procedure or as directed by your health care provider.  Ask your health care provider about changing or stopping your regular medicines. This is especially important if you are taking diabetes medicines or blood thinners.  Plan to have someone take you home after the procedure. What happens during the procedure?  You will be given a medicine that makes you relaxed and sleepy (sedative).  A medicine may be sprayed or gargled to numb the back of the throat.  Your health care provider can use various instruments to do an esophageal dilatation. During the procedure, the instrument used will be placed in your mouth and passed down into your esophagus. Options include: ? Simple dilators. This instrument is carefully placed in the esophagus to stretch it. ? Guided wire bougies. In this method, a flexible tube (endoscope) is used to insert a wire into  the esophagus. The dilator is passed over this wire to enlarge the esophagus. Then the wire is removed. ? Balloon dilators. An endoscope with a small balloon at the end is passed down into the esophagus. Inflating the balloon gently stretches the esophagus and opens it up. What happens after the procedure?  Your blood pressure, heart rate, breathing rate, and blood oxygen level will be monitored often until the medicines you were given have worn off.  Your throat may feel slightly sore and will probably still feel numb. This will improve slowly over time.  You will not be allowed to eat or drink until the throat numbness has resolved.  If this is a same-day procedure, you may be  allowed to go home once you have been able to drink, urinate, and sit on the edge of the bed without nausea or dizziness.  If this is a same-day procedure, you should have a friend or family member with you for the next 24 hours after the procedure. This information is not intended to replace advice given to you by your health care provider. Make sure you discuss any questions you have with your health care provider. Document Released: 03/07/2005 Document Revised: 06/22/2015 Document Reviewed: 05/26/2013 Elsevier Interactive Patient Education  2018 [employer]  Monitored Anesthesia Care Anesthesia is a term that refers to techniques, procedures, and medicines that help a person stay safe and comfortable during a medical procedure. Monitored anesthesia care, or sedation, is one type of anesthesia. Your anesthesia specialist may recommend sedation if you will be having a procedure that does not require you to be unconscious, such as:  Cataract surgery.  A dental procedure.  A biopsy.  A colonoscopy.  During the procedure, you may receive a medicine to help you relax (sedative). There are three levels of sedation:  Mild sedation. At this level, you may feel awake and relaxed. You will be able to follow  directions.  Moderate sedation. At this level, you will be sleepy. You may not remember the procedure.  Deep sedation. At this level, you will be asleep. You will not remember the procedure.  The more medicine you are given, the deeper your level of sedation will be. Depending on how you respond to the procedure, the anesthesia specialist may change your level of sedation or the type of anesthesia to fit your needs. An anesthesia specialist will monitor you closely during the procedure. Let your health care provider know about:  Any allergies you have.  All medicines you are taking, including vitamins, herbs, eye drops, creams, and over-the-counter medicines.  Any use of steroids (by mouth or as a cream).  Any problems you or family members have had with sedatives and anesthetic medicines.  Any blood disorders you have.  Any surgeries you have had.  Any medical conditions you have, such as sleep apnea.  Whether you are pregnant or may be pregnant.  Any use of cigarettes, alcohol, or street drugs. What are the risks? Generally, this is a safe procedure. However, problems may occur, including:  Getting too much medicine (oversedation).  Nausea.  Allergic reaction to medicines.  Trouble breathing. If this happens, a breathing tube may be used to help with breathing. It will be removed when you are awake and breathing on your own.  Heart trouble.  Lung trouble.  Before the procedure Staying hydrated Follow instructions from your health care provider about hydration, which may include:  Up to 2 hours before the procedure - you may continue to drink clear liquids, such as water, clear fruit juice, black coffee, and plain tea.  Eating and drinking restrictions Follow instructions from your health care provider about eating and drinking, which may include:  8 hours before the procedure - stop eating heavy meals or foods such as meat, fried foods, or fatty foods.  6 hours  before the procedure - stop eating light meals or foods, such as toast or cereal.  6 hours before the procedure - stop drinking milk or drinks that contain milk.  2 hours before the procedure - stop drinking clear liquids.  Medicines Ask your health care provider about:  Changing or stopping your regular medicines. This is especially important if you are taking diabetes  medicines or blood thinners.  Taking medicines such as aspirin and ibuprofen. These medicines can thin your blood. Do not take these medicines before your procedure if your health care provider instructs you not to.  Tests and exams  You will have a physical exam.  You may have blood tests done to show: ? How well your kidneys and liver are working. ? How well your blood can clot.  General instructions  Plan to have someone take you home from the hospital or clinic.  If you will be going home right after the procedure, plan to have someone with you for 24 hours.  What happens during the procedure?  Your blood pressure, heart rate, breathing, level of pain and overall condition will be monitored.  An IV tube will be inserted into one of your veins.  Your anesthesia specialist will give you medicines as needed to keep you comfortable during the procedure. This may mean changing the level of sedation.  The procedure will be performed. After the procedure  Your blood pressure, heart rate, breathing rate, and blood oxygen level will be monitored until the medicines you were given have worn off.  Do not drive for 24 hours if you received a sedative.  You may: ? Feel sleepy, clumsy, or nauseous. ? Feel forgetful about what happened after the procedure. ? Have a sore throat if you had a breathing tube during the procedure. ? Vomit. This information is not intended to replace advice given to you by your health care provider. Make sure you discuss any questions you have with your health care provider. Document  Released: 10/10/2004 Document Revised: 06/23/2015 Document Reviewed: 05/07/2015 Elsevier Interactive Patient Education  2018 [employer] Monitored Anesthesia Care, Care After These instructions provide you with information about caring for yourself after your procedure. Your health care provider may also give you more specific instructions. Your treatment has been planned according to current medical practices, but problems sometimes occur. Call your health care provider if you have any problems or questions after your procedure. What can I expect after the procedure? After your procedure, it is common to:  Feel sleepy for several hours.  Feel clumsy and have poor balance for several hours.  Feel forgetful about what happened after the procedure.  Have poor judgment for several hours.  Feel nauseous or vomit.  Have a sore throat if you had a breathing tube during the procedure.  Follow these instructions at home: For at least 24 hours after the procedure:   Do not: ? Participate in activities in which you could fall or become injured. ? Drive. ? Use heavy machinery. ? Drink alcohol. ? Take sleeping pills or medicines that cause drowsiness. ? Make important decisions or sign legal documents. ? Take care of children on your own.  Rest. Eating and drinking  Follow the diet that is recommended by your health care provider.  If you vomit, drink water, juice, or soup when you can drink without vomiting.  Make sure you have little or no nausea before eating solid foods. General instructions  Have a responsible adult stay with you until you are awake and alert.  Take over-the-counter and prescription medicines only as told by your health care provider.  If you smoke, do not smoke without supervision.  Keep all follow-up visits as told by your health care provider. This is important. Contact a health care provider if:  You keep feeling nauseous or you keep  vomiting.  You feel light-headed.  You  develop a rash.  You have a fever. Get help right away if:  You have trouble breathing. This information is not intended to replace advice given to you by your health care provider. Make sure you discuss any questions you have with your health care provider. Document Released: 05/07/2015 Document Revised: 09/06/2015 Document Reviewed: 05/07/2015 Elsevier Interactive Patient Education  [employer].

## 2017-08-12 ENCOUNTER — Other Ambulatory Visit: Payer: Self-pay

## 2017-08-12 ENCOUNTER — Encounter (HOSPITAL_COMMUNITY)
Admission: RE | Admit: 2017-08-12 | Discharge: 2017-08-12 | Disposition: A | Discharge status: Home / Self Care | Payer: Medicare Other | Source: Ambulatory Visit | Attending: Gastroenterology | Admitting: Gastroenterology

## 2017-08-12 ENCOUNTER — Encounter (HOSPITAL_COMMUNITY): Payer: Self-pay

## 2017-08-12 DIAGNOSIS — Z01818 Encounter for other preprocedural examination: Secondary | ICD-10-CM | POA: Insufficient documentation

## 2017-08-12 DIAGNOSIS — R1310 Dysphagia, unspecified: Secondary | ICD-10-CM | POA: Diagnosis not present

## 2017-08-12 DIAGNOSIS — K219 Gastro-esophageal reflux disease without esophagitis: Secondary | ICD-10-CM | POA: Diagnosis not present

## 2017-08-12 DIAGNOSIS — R1013 Epigastric pain: Secondary | ICD-10-CM | POA: Diagnosis not present

## 2017-08-12 HISTORY — DX: Unspecified glaucoma: H40.9

## 2017-08-12 HISTORY — DX: Cerebral infarction, unspecified: I63.9

## 2017-08-12 LAB — CBC WITH DIFFERENTIAL/PLATELET
Basophils Absolute: 0 10*3/uL (ref 0.0–0.1)
Basophils Relative: 0 %
EOS ABS: 0.2 10*3/uL (ref 0.0–0.7)
EOS PCT: 2 %
HCT: 42.8 % (ref 36.0–46.0)
Hemoglobin: 13.5 g/dL (ref 12.0–15.0)
LYMPHS ABS: 2.4 10*3/uL (ref 0.7–4.0)
LYMPHS PCT: 29 %
MCH: 22.7 pg — AB (ref 26.0–34.0)
MCHC: 31.5 g/dL (ref 30.0–36.0)
MCV: 71.9 fL — AB (ref 78.0–100.0)
MONO ABS: 0.4 10*3/uL (ref 0.1–1.0)
Monocytes Relative: 5 %
Neutro Abs: 5.3 10*3/uL (ref 1.7–7.7)
Neutrophils Relative %: 64 %
PLATELETS: 227 10*3/uL (ref 150–400)
RBC: 5.95 MIL/uL — ABNORMAL HIGH (ref 3.87–5.11)
RDW: 15.6 % — AB (ref 11.5–15.5)
WBC: 8.4 10*3/uL (ref 4.0–10.5)

## 2017-08-12 LAB — BASIC METABOLIC PANEL
Anion gap: 7 (ref 5–15)
BUN: 14 mg/dL (ref 8–23)
CALCIUM: 9.6 mg/dL (ref 8.9–10.3)
CO2: 29 mmol/L (ref 22–32)
Chloride: 104 mmol/L (ref 98–111)
Creatinine, Ser: 0.87 mg/dL (ref 0.44–1.00)
GFR calc Af Amer: >60 mL/min (ref >60)
GFR, EST NON AFRICAN AMERICAN: 59 mL/min — AB (ref >60)
GLUCOSE: 113 mg/dL — AB (ref 70–99)
Potassium: 3.3 mmol/L — ABNORMAL LOW (ref 3.5–5.1)
Sodium: 140 mmol/L (ref 135–145)

## 2017-08-14 ENCOUNTER — Other Ambulatory Visit: Payer: Self-pay | Admitting: Gastroenterology

## 2017-08-14 ENCOUNTER — Telehealth: Payer: Self-pay | Admitting: Gastroenterology

## 2017-08-14 MED ORDER — POTASSIUM CHLORIDE ER 20 MEQ PO TBCR: EXTENDED_RELEASE_TABLET | ORAL | 0 refills | Status: DC

## 2017-08-14 NOTE — Telephone Encounter (Signed)
Pt is aware.  

## 2017-08-14 NOTE — Telephone Encounter (Signed)
PLEASE CALL PT. SHE HAS A LOW POTASSIUM. RX SENT FOR KCL 40 mEq BID FOR 3 DAYS.  

## 2017-08-15 ENCOUNTER — Other Ambulatory Visit: Payer: Self-pay | Admitting: Gastroenterology

## 2017-08-18 ENCOUNTER — Telehealth: Payer: Self-pay

## 2017-08-18 NOTE — Telephone Encounter (Signed)
Called pt, EGD/-/+Dilation for tomorrow moved up to 12:00p. Advised her to arrive at 10:30am and NPO after 8:00am. Called and informed Endo scheduler.

## 2017-08-19 ENCOUNTER — Encounter (HOSPITAL_COMMUNITY): Payer: Self-pay | Admitting: Gastroenterology

## 2017-08-19 ENCOUNTER — Ambulatory Visit (HOSPITAL_COMMUNITY)
Admission: RE | Admit: 2017-08-19 | Discharge: 2017-08-19 | Disposition: A | Discharge status: Home / Self Care | Payer: Medicare Other | Source: Ambulatory Visit | Attending: Gastroenterology | Admitting: Gastroenterology

## 2017-08-19 ENCOUNTER — Encounter (HOSPITAL_COMMUNITY)
Admission: RE | Disposition: A | Discharge status: Home / Self Care | Payer: Self-pay | Source: Ambulatory Visit | Attending: Gastroenterology

## 2017-08-19 ENCOUNTER — Ambulatory Visit (HOSPITAL_COMMUNITY): Payer: Medicare Other | Admitting: Anesthesiology

## 2017-08-19 DIAGNOSIS — E119 Type 2 diabetes mellitus without complications: Secondary | ICD-10-CM | POA: Insufficient documentation

## 2017-08-19 DIAGNOSIS — R1310 Dysphagia, unspecified: Secondary | ICD-10-CM

## 2017-08-19 DIAGNOSIS — K222 Esophageal obstruction: Secondary | ICD-10-CM | POA: Diagnosis not present

## 2017-08-19 DIAGNOSIS — K219 Gastro-esophageal reflux disease without esophagitis: Secondary | ICD-10-CM | POA: Diagnosis not present

## 2017-08-19 DIAGNOSIS — I10 Essential (primary) hypertension: Secondary | ICD-10-CM | POA: Diagnosis not present

## 2017-08-19 DIAGNOSIS — H409 Unspecified glaucoma: Secondary | ICD-10-CM | POA: Insufficient documentation

## 2017-08-19 DIAGNOSIS — E785 Hyperlipidemia, unspecified: Secondary | ICD-10-CM | POA: Insufficient documentation

## 2017-08-19 DIAGNOSIS — Z79899 Other long term (current) drug therapy: Secondary | ICD-10-CM | POA: Insufficient documentation

## 2017-08-19 DIAGNOSIS — K589 Irritable bowel syndrome without diarrhea: Secondary | ICD-10-CM | POA: Diagnosis not present

## 2017-08-19 DIAGNOSIS — F419 Anxiety disorder, unspecified: Secondary | ICD-10-CM | POA: Diagnosis not present

## 2017-08-19 DIAGNOSIS — Z955 Presence of coronary angioplasty implant and graft: Secondary | ICD-10-CM | POA: Diagnosis not present

## 2017-08-19 DIAGNOSIS — Z7982 Long term (current) use of aspirin: Secondary | ICD-10-CM | POA: Diagnosis not present

## 2017-08-19 DIAGNOSIS — J449 Chronic obstructive pulmonary disease, unspecified: Secondary | ICD-10-CM | POA: Insufficient documentation

## 2017-08-19 DIAGNOSIS — I2510 Atherosclerotic heart disease of native coronary artery without angina pectoris: Secondary | ICD-10-CM | POA: Insufficient documentation

## 2017-08-19 DIAGNOSIS — F329 Major depressive disorder, single episode, unspecified: Secondary | ICD-10-CM | POA: Diagnosis not present

## 2017-08-19 DIAGNOSIS — R1013 Epigastric pain: Secondary | ICD-10-CM

## 2017-08-19 DIAGNOSIS — R1319 Other dysphagia: Secondary | ICD-10-CM

## 2017-08-19 DIAGNOSIS — Z8673 Personal history of transient ischemic attack (TIA), and cerebral infarction without residual deficits: Secondary | ICD-10-CM | POA: Insufficient documentation

## 2017-08-19 DIAGNOSIS — K2970 Gastritis, unspecified, without bleeding: Secondary | ICD-10-CM | POA: Diagnosis not present

## 2017-08-19 HISTORY — PX: SAVORY DILATION: SHX5439

## 2017-08-19 HISTORY — PX: ESOPHAGOGASTRODUODENOSCOPY (EGD) WITH PROPOFOL: SHX5813

## 2017-08-19 SURGERY — ESOPHAGOGASTRODUODENOSCOPY (EGD) WITH PROPOFOL
Anesthesia: Monitor Anesthesia Care

## 2017-08-19 MED ORDER — CHLORHEXIDINE GLUCONATE CLOTH 2 % EX PADS: 6.0000 | MEDICATED_PAD | Freq: Once | CUTANEOUS | Status: DC

## 2017-08-19 MED ORDER — PROPOFOL 500 MG/50ML IV EMUL
INTRAVENOUS | Status: DC | PRN
  Administered 2017-08-19: 75 ug/kg/min via INTRAVENOUS

## 2017-08-19 MED ORDER — LIDOCAINE VISCOUS HCL 2 % MT SOLN: 10.0000 mL | Freq: Once | OROMUCOSAL | Status: DC

## 2017-08-19 MED ORDER — LIDOCAINE VISCOUS HCL 2 % MT SOLN
OROMUCOSAL | Status: DC | PRN
  Administered 2017-08-19: 10 via OROMUCOSAL

## 2017-08-19 MED ORDER — LACTATED RINGERS IV SOLN
INTRAVENOUS | Status: DC
  Administered 2017-08-19 (×2): via INTRAVENOUS

## 2017-08-19 MED ORDER — ESOMEPRAZOLE MAGNESIUM 40 MG PO CPDR: 40.0000 mg | DELAYED_RELEASE_CAPSULE | Freq: Two times a day (BID) | ORAL | 5 refills | Status: DC

## 2017-08-19 MED ORDER — LIDOCAINE VISCOUS HCL 2 % MT SOLN
OROMUCOSAL | Status: AC
  Filled 2017-08-19: qty 15

## 2017-08-19 MED ORDER — PROPOFOL 10 MG/ML IV BOLUS
INTRAVENOUS | Status: DC | PRN
  Administered 2017-08-19: 40 mg via INTRAVENOUS
  Administered 2017-08-19: 20 mg via INTRAVENOUS

## 2017-08-19 MED ORDER — STERILE WATER FOR IRRIGATION IR SOLN
Status: DC | PRN
  Administered 2017-08-19: 100 mL

## 2017-08-19 NOTE — Telephone Encounter (Signed)
Called, many rings and no answer.

## 2017-08-19 NOTE — Anesthesia Postprocedure Evaluation (Signed)
Anesthesia Post Note  Patient: Sheila Myers  Procedure(s) Performed: ESOPHAGOGASTRODUODENOSCOPY (EGD) WITH PROPOFOL (N/A ) SAVORY DILATION (N/A )  Patient location during evaluation: PACU Anesthesia Type: MAC Level of consciousness: awake and alert and patient cooperative Pain management: satisfactory to patient Vital Signs Assessment: post-procedure vital signs reviewed and stable Respiratory status: spontaneous breathing Cardiovascular status: stable Postop Assessment: no apparent nausea or vomiting Anesthetic complications: no     Last Vitals:  Vitals:   08/19/17 1148 08/19/17 1200  BP: (!) 139/59 (!) 143/83  Pulse: 66 66  Resp: 18 16  Temp: 36.6 C   SpO2: 92% 96%    Last Pain:  Vitals:   08/19/17 1200  TempSrc:   PainSc: 0-No pain                 Maybelline Kolarik

## 2017-08-19 NOTE — Op Note (Signed)
[facility] Patient Name: Sheila Myers Procedure Date: 08/19/2017 11:10 AM MRN: [id number] Date of Birth: 1929/01/13 Attending MD: Jonette Eva MD, MD CSN: [id number] Age: 84 Admit Type: Outpatient Procedure:                Upper GI endoscopy WITH ESOPHAGEAL DILATION Indications:              Dysphagia, Esophageal reflux, Esophageal reflux                            symptoms that persist despite appropriate therapy.                            FAILED PROTONIX BID. Providers:                Jonette Eva MD, MD, Edrick Kins, RN Referring MD:             [name]. [name] MD, MD Medicines:                Propofol per Anesthesia Complications:            No immediate complications. Estimated Blood Loss:     Estimated blood loss was minimal. Procedure:                Pre-Anesthesia Assessment:                           - Prior to the procedure, a History and Physical                            was performed, and patient medications and                            allergies were reviewed. The patient's tolerance of                            previous anesthesia was also reviewed. The risks                            and benefits of the procedure and the sedation                            options and risks were discussed with the patient.                            All questions were answered, and informed consent                            was obtained. Prior Anticoagulants: The patient has                            taken aspirin, last dose was 1 day prior to                            procedure. ASA Grade Assessment: II - A patient  with mild systemic disease. After reviewing the                            risks and benefits, the patient was deemed in                            satisfactory condition to undergo the procedure.                            After obtaining informed consent, the endoscope was                            passed under direct  vision. Throughout the                            procedure, the patient's blood pressure, pulse, and                            oxygen saturations were monitored continuously. The                            [id number]-H190 ([id number]) scope was introduced through the                            mouth, and advanced to the second part of duodenum.                            The upper GI endoscopy was accomplished without                            difficulty. The patient tolerated the procedure                            well. Scope In: 11:32:46 AM Scope Out: 11:38:39 AM Total Procedure Duration: 0 hours 5 minutes 53 seconds  Findings:      One benign-appearing, intrinsic moderate (circumferential scarring or       stenosis; an endoscope may pass) stenosis was found. This stenosis       measured 1.5 cm (inner diameter). The stenosis was traversed. A       guidewire was placed and the scope was withdrawn. Dilation was performed       with a Savary dilator with mild resistance at 16 mm and 17 mm.      Patchy mild inflammation characterized by congestion (edema) and       erythema was found in the gastric antrum.      The examined duodenum was normal. Impression:               - Benign-appearing esophageal stenosis. Dilated.                           - MILD Gastritis DUE TO ASA. Moderate Sedation:      Per Anesthesia Care Recommendation:           - Patient has a contact number available for  emergencies. The signs and symptoms of potential                            delayed complications were discussed with the                            patient. Return to normal activities tomorrow.                            Written discharge instructions were provided to the                            patient.                           - Low fat diet. AVOID REFLUX TRIGGERS.                           - Continue present medications. ATTEMPT TO GET                            NEXIUM.                            - Await pathology results.                           - Return to my office in 4 months. IF DYSPHAGIA                            PERSIST CONSIDER ESOPHAGEAL MANOMETRY/PH PROBE. Procedure Code(s):        --- Professional ---                           [phone], Esophagogastroduodenoscopy, flexible,                            transoral; with insertion of guide wire followed by                            passage of dilator(s) through esophagus over guide                            wire Diagnosis Code(s):        --- Professional ---                           K22.2, Esophageal obstruction                           K29.70, Gastritis, unspecified, without bleeding                           R13.10, Dysphagia, unspecified                           K21.9, Gastro-esophageal reflux disease without  esophagitis CPT copyright 2017 [employer] Association. All rights reserved. The codes documented in this report are preliminary and upon coder review may  be revised to meet current compliance requirements. Jonette EvaSandi Gerell Fortson, MD Jonette EvaSandi Teruo Stilley MD, MD 08/19/2017 11:51:00 AM This report has been signed electronically. Number of Addenda: 0

## 2017-08-19 NOTE — Discharge Instructions (Signed)
I dilated your esophagus. You have a stricture near the base of your esophagus AND ESOPHAGITIS LIKELY DUE TO UNCONTROLLED REFLUX.Marland Kitchen  You have mild gastritis due to aspiring use.    DRINK WATER TO KEEP YOUR URINE LIGHT YELLOW.  AVOID REFLUX TRIGGERS. SEE INFO BELOW.  STRICTLY FOLLOW A LOW FAT DIET. SEE INFO BELOW.   Continue Protonix TWICE DAILY. I am trying to get your NEXIUM COVERED.    FOLLOW UP IN 4 MOS WITH DR. Adonna Horsley.  UPPER ENDOSCOPY AFTER CARE Read the instructions outlined below and refer to this sheet in the next week. These discharge instructions provide you with general information on caring for yourself after you leave the hospital. While your treatment has been planned according to the most current medical practices available, unavoidable complications occasionally occur. If you have any problems or questions after discharge, call DR. Audreyanna Butkiewicz, [phone].  ACTIVITY  You may resume your regular activity, but move at a slower pace for the next 24 hours.   Take frequent rest periods for the next 24 hours.   Walking will help get rid of the air and reduce the bloated feeling in your belly (abdomen).   No driving for 24 hours (because of the medicine (anesthesia) used during the test).   You may shower.   Do not sign any important legal documents or operate any machinery for 24 hours (because of the anesthesia used during the test).    NUTRITION  Drink plenty of fluids.   You may resume your normal diet as instructed by your doctor.   Begin with a light meal and progress to your normal diet. Heavy or fried foods are harder to digest and may make you feel sick to your stomach (nauseated).   Avoid alcoholic beverages for 24 hours or as instructed.    MEDICATIONS  You may resume your normal medications.   WHAT YOU CAN EXPECT TODAY  Some feelings of bloating in the abdomen.   Passage of more gas than usual.    IF YOU HAD A BIOPSY TAKEN DURING THE UPPER  ENDOSCOPY:  Eat a soft diet IF YOU HAVE NAUSEA, BLOATING, ABDOMINAL PAIN, OR VOMITING.    FINDING OUT THE RESULTS OF YOUR TEST Not all test results are available during your visit. DR. Darrick Penna WILL CALL YOU WITHIN 14 DAYS OF YOUR PROCEDUE WITH YOUR RESULTS. Do not assume everything is normal if you have not heard from DR. Alexandra Lipps, CALL HER OFFICE AT [phone].  SEEK IMMEDIATE MEDICAL ATTENTION AND CALL THE OFFICE: [phone] IF:  You have more than a spotting of blood in your stool.   Your belly is swollen (abdominal distention).   You are nauseated or vomiting.   You have a temperature over 101F.   You have abdominal pain or discomfort that is severe or gets worse throughout the day.  Gastritis  Gastritis is an inflammation (the body's way of reacting to injury and/or infection) of the stomach. It is often caused by viral or bacterial (germ) infections. It can also be caused BY ASPIRIN, BC/GOODY POWDER'S, (IBUPROFEN) MOTRIN, OR ALEVE (NAPROXEN), chemicals (including alcohol), SPICY FOODS, and medications. This illness may be associated with generalized malaise (feeling tired, not well), UPPER ABDOMINAL STOMACH cramps, and fever. One common bacterial cause of gastritis is an organism known as H. Pylori. This can be treated with antibiotics.   ESOPHAGEAL STRICTURE  Esophageal strictures can be caused by stomach acid backing up into the tube that carries food from the mouth down to the  stomach (lower esophagus).  TREATMENT There are a number of  medicines used to treat reflux/stricture, including: Antacids.  Proton-pump inhibitors: PROTONIX OR NEXIUM PEPCID OR ZANTAC     Lifestyle and home remedies TO CONTROL REFLUX/HEARTBURN You may eliminate or reduce the frequency of heartburn by making the following lifestyle changes:   Control your weight. Being overweight is a major risk factor for heartburn and GERD. Excess pounds put pressure on your abdomen, pushing up your stomach  and causing acid to back up into your esophagus.    Eat smaller meals. 4 TO 6 MEALS A DAY. This reduces pressure on the lower esophageal sphincter, helping to prevent the valve from opening and acid from washing back into your esophagus.    Loosen your belt. Clothes that fit tightly around your waist put pressure on your abdomen and the lower esophageal sphincter.    Eliminate heartburn triggers. Everyone has specific triggers. Common triggers such as fatty or fried foods, spicy food, tomato sauce, carbonated beverages, alcohol, chocolate, mint, garlic, onion, caffeine and nicotine may make heartburn worse.    Avoid stooping or bending. Tying your shoes is OK. Bending over for longer periods to weed your garden isn't, especially soon after eating.    Don't lie down after a meal. Wait at least three to four hours after eating before going to bed, and don't lie down right after eating.    Alternative medicine  Several home remedies exist for treating GERD, but they provide only temporary relief. They include drinking baking soda (sodium bicarbonate) added to water or drinking other fluids such as baking soda mixed with cream of tartar and water.   Although these liquids create temporary relief by neutralizing, washing away or buffering acids, eventually they aggravate the situation by adding gas and fluid to your stomach, increasing pressure and causing more acid reflux. Further, adding more sodium to your diet may increase your blood pressure and add stress to your heart, and excessive bicarbonate ingestion can alter the acid-base balance in your body.   Low-Fat Diet  BREADS, CEREALS, PASTA, RICE, DRIED PEAS, AND BEANS  These products are high in carbohydrates and most are low in fat. Therefore, they can be increased in the diet as substitutes for fatty foods. They too, however, contain calories and should not be eaten in excess. Cereals can be eaten for snacks as well as for  breakfast.   Include foods that contain fiber (fruits, vegetables, whole grains, and legumes). Research shows that fiber may lower blood cholesterol levels, especially the water-soluble fiber found in fruits, vegetables, oat products, and legumes.  FRUITS AND VEGETABLES  It is good to eat fruits and vegetables. Besides being sources of fiber, both are rich in vitamins and some minerals. They help you get the daily allowances of these nutrients. Fruits and vegetables can be used for snacks and desserts.  MEATS  Limit lean meat, chicken, [place], and fish to no more than 6 ounces per day.  Beef, Pork, and Lamb  Use lean cuts of beef, pork, and lamb. Lean cuts include:   Extra-lean ground beef.   Arm roast.   Sirloin tip.   Center-cut ham.   Round steak.   Loin chops.   Rump roast.   Tenderloin.   Trim all fat off the outside of meats before cooking. It is not necessary to severely decrease the intake of red meat, but lean choices should be made. Lean meat is rich in protein and contains a highly absorbable form  of iron. Premenopausal women, in particular, should avoid reducing lean red meat because this could increase the risk for low red blood cells (iron-deficiency anemia).  Processed Meats  Processed meats, such as bacon, bologna, salami, sausage, and hot dogs contain large quantities of fat, are not rich in valuable nutrients, and should not be eaten very often.  Organ Meats  The organ meats, such as liver, sweetbreads, kidneys, and brain are very rich in cholesterol. They should be limited.  Chicken and [place]  These are good sources of protein. The fat of poultry can be reduced by removing the skin and underlying fat layers before cooking. Chicken and [place] can be substituted for lean red meat in the diet. Poultry should not be fried or covered with high-fat sauces.  Fish and Shellfish  Fish is a good source of protein. Shellfish contain cholesterol, but they  usually are low in saturated fatty acids. The preparation of fish is important. Like chicken and [place], they should not be fried or covered with high-fat sauces.  EGGS  Egg yolks often are hidden in cooked and processed foods. Egg whites contain no fat or cholesterol. They can be eaten often. Try 1 to 2 egg whites instead of whole eggs in recipes or use egg substitutes that do not contain yolk.  MILK AND DAIRY PRODUCTS  Use skim or 1% milk instead of 2% or whole milk. Decrease whole milk, natural, and processed cheeses. Use nonfat or low-fat (2%) cottage cheese or low-fat cheeses made from vegetable oils. Choose nonfat or low-fat (1 to 2%) yogurt. Experiment with evaporated skim milk in recipes that call for heavy cream. Substitute low-fat yogurt or low-fat cottage cheese for sour cream in dips and salad dressings. Have at least 2 servings of low-fat dairy products, such as 2 glasses of skim (or 1%) milk each day to help get your daily calcium intake.  FATS AND OILS  Reduce the total intake of fats, especially saturated fat. Butterfat, lard, and beef fats are high in saturated fat and cholesterol. These should be avoided as much as possible. Vegetable fats do not contain cholesterol, but certain vegetable fats, such as coconut oil, palm oil, and palm kernel oil are very high in saturated fats. These should be limited. These fats are often used in bakery goods, processed foods, popcorn, oils, and nondairy creamers. Vegetable shortenings and some peanut butters contain hydrogenated oils, which are also saturated fats. Read the labels on these foods and check for saturated vegetable oils.  Unsaturated vegetable oils and fats do not raise blood cholesterol. However, they should be limited because they are fats and are high in calories. Total fat should still be limited to 30% of your daily caloric intake. Desirable liquid vegetable oils are corn oil, cottonseed oil, olive oil, canola oil, safflower oil,  soybean oil, and sunflower oil. Peanut oil is not as good, but small amounts are acceptable. Buy a heart-healthy tub margarine that has no partially hydrogenated oils in the ingredients. Mayonnaise and salad dressings often are made from unsaturated fats, but they should also be limited because of their high calorie and fat content.  Seeds, nuts, peanut butter, olives, and avocados are high in fat, but the fat is mainly the unsaturated type. These foods should be limited mainly to avoid excess calories and fat.  OTHER EATING TIPS  Snacks   Most sweets should be limited as snacks. They tend to be rich in calories and fats, and their caloric content outweighs their nutritional value.  Some good choices in snacks are graham crackers, melba toast, soda crackers, bagels (no egg), English muffins, fruits, and vegetables. These snacks are preferable to snack crackers, [place] fries, and chips. Popcorn should be air-popped or cooked in small amounts of liquid vegetable oil.  Desserts  Eat fruit, low-fat yogurt, and fruit ices instead of pastries, cake, and cookies. Sherbet, angel food cake, gelatin dessert, frozen low-fat yogurt, or other frozen products that do not contain saturated fat (pure fruit juice bars, frozen ice pops) are also acceptable.   COOKING METHODS  Choose those methods that use little or no fat. They include:  Poaching.   Braising.   Steaming.   Grilling.   Baking.   Stir-frying.   Broiling.   Microwaving.   Foods can be cooked in a nonstick pan without added fat, or use a nonfat cooking spray in regular cookware. Limit fried foods and avoid frying in saturated fat. Add moisture to lean meats by using water, broth, cooking wines, and other nonfat or low-fat sauces along with the cooking methods mentioned above.  Soups and stews should be chilled after cooking. The fat that forms on top after a few hours in the refrigerator should be skimmed off. When preparing meals,  avoid using excess salt. Salt can contribute to raising blood pressure in some people.  EATING AWAY FROM HOME  Order entres, potatoes, and vegetables without sauces or butter. When meat exceeds the size of a deck of cards (3 to 4 ounces), the rest can be taken home for another meal.  Choose vegetable or fruit salads and ask for low-calorie salad dressings to be served on the side. Use dressings sparingly. Limit high-fat toppings, such as bacon, crumbled eggs, cheese, sunflower seeds, and olives. Ask for heart-healthy tub margarine instead of butter.

## 2017-08-19 NOTE — Anesthesia Procedure Notes (Signed)
Procedure Name: MAC Date/Time: 08/19/2017 11:25 AM Performed by: Vista Deck, CRNA Pre-anesthesia Checklist: Patient identified, Emergency Drugs available, Suction available, Timeout performed and Patient being monitored Patient Re-evaluated:Patient Re-evaluated prior to induction Oxygen Delivery Method: Nasal Cannula

## 2017-08-19 NOTE — Telephone Encounter (Signed)
I spoke to Sheila Myers at the pharmacy. She said NO PA is required.

## 2017-08-19 NOTE — Transfer of Care (Signed)
Immediate Anesthesia Transfer of Care Note  Patient: Peggye Pitt  Procedure(s) Performed: ESOPHAGOGASTRODUODENOSCOPY (EGD) WITH PROPOFOL (N/A ) SAVORY DILATION (N/A )  Patient Location: PACU  Anesthesia Type:MAC  Level of Consciousness: awake and patient cooperative  Airway & Oxygen Therapy: Patient Spontanous Breathing  Post-op Assessment: Report given to RN and Post -op Vital signs reviewed and stable  Post vital signs: Reviewed and stable  Last Vitals:  Vitals Value Taken Time  BP    Temp    Pulse 66 08/19/2017 11:47 AM  Resp    SpO2 91 % 08/19/2017 11:47 AM  Vitals shown include unvalidated device data.  Last Pain:  Vitals:   08/19/17 1141  TempSrc:   PainSc: 0-No pain      Patients Stated Pain Goal: 8 (20/25/42 7062)  Complications: No apparent anesthesia complications

## 2017-08-19 NOTE — Telephone Encounter (Signed)
PLEASE CALL PT. [facility] called [facility] In their system she does not have a high co-pay. It appears she picked up her last Rx within the last 30 days. She should contact them when it is time for another refill.

## 2017-08-19 NOTE — Anesthesia Preprocedure Evaluation (Signed)
Anesthesia Evaluation  Patient identified by MRN, date of birth, ID band Patient awake    Reviewed: Allergy & Precautions, H&P , NPO status , Patient's Chart, lab work & pertinent test results, reviewed documented beta blocker date and time   Airway Mallampati: II  TM Distance: >3 FB Neck ROM: full    Dental no notable dental hx. (+) Partial Upper, Missing, Poor Dentition   Pulmonary neg pulmonary ROS, asthma , COPD,    Pulmonary exam normal breath sounds clear to auscultation       Cardiovascular Exercise Tolerance: Good hypertension, + CAD and + Cardiac Stents  negative cardio ROS   Rhythm:regular Rate:Normal     Neuro/Psych PSYCHIATRIC DISORDERS Anxiety Depression TIACVA negative neurological ROS  negative psych ROS   GI/Hepatic negative GI ROS, Neg liver ROS, GERD  ,Dysphagia    Endo/Other  negative endocrine ROSdiabetes  Renal/GU negative Renal ROS  negative genitourinary   Musculoskeletal   Abdominal   Peds  Hematology negative hematology ROS (+)   Anesthesia Other Findings   Reproductive/Obstetrics negative OB ROS                             Anesthesia Physical Anesthesia Plan  ASA: III  Anesthesia Plan: MAC   Post-op Pain Management:    Induction:   PONV Risk Score and Plan:   Airway Management Planned:   Additional Equipment:   Intra-op Plan:   Post-operative Plan:   Informed Consent: I have reviewed the patients History and Physical, chart, labs and discussed the procedure including the risks, benefits and alternatives for the proposed anesthesia with the patient or authorized representative who has indicated his/her understanding and acceptance.   Dental Advisory Given  Plan Discussed with: CRNA  Anesthesia Plan Comments:         Anesthesia Quick Evaluation

## 2017-08-19 NOTE — Telephone Encounter (Signed)
CALL PHARMACY TO SEE IF PA NEEDED FOR NEXIUM AND IF IT'S NOT COVERED, ASK WHICH PPI OTHER THAN PROTONIX IS COVERED.

## 2017-08-19 NOTE — Telephone Encounter (Signed)
REVIEWED-NO ADDITIONAL RECOMMENDATIONS. 

## 2017-08-19 NOTE — H&P (Signed)
Primary Care Physician:  Kari Baars, MD Primary Gastroenterologist:  Dr. Darrick Penna  Pre-Procedure History & Physical: HPI:  Sheila Myers is a 84 y.o. female here for DYSPHAGIA/GERD.  Past Medical History:  Diagnosis Date  . Anxiety   . ASCVD (arteriosclerotic cardiovascular disease)    H/o PCI  . Asthma   . Chronic back pain   . Chronic obstructive pulmonary disease (HCC)   . Degenerative joint disease    Right shoulder pain-mechanism unclear  . Depression   . Diabetes mellitus type II   . Gastroesophageal reflux disease    Diverticulosis, hemorrhoids with rectal bleeding,  . Glaucoma   . Glaucoma   . Hyperlipidemia   . Hypertension   . Irritable bowel syndrome    constipation alternating with diarrhea  . Stroke [facility]) 2017   TIA    Past Surgical History:  Procedure Laterality Date  . ABDOMINAL HYSTERECTOMY    . APPENDECTOMY    . CHOLECYSTECTOMY    . COLONOSCOPY WITH ESOPHAGOGASTRODUODENOSCOPY (EGD)  2007   Dr. Darrick Penna: diverticulosis, external hemorrhoids, hiatal hernia, patent Schatzki ring.  . CORONARY ANGIOPLASTY WITH STENT PLACEMENT  1991  . ESOPHAGOGASTRODUODENOSCOPY  2012   Dr. Darrick Penna: Peptic stricture versus ring status post dilation, gastric inflammation and polyps,  biopsies unremarkable.    Prior to Admission medications   Medication Sig Start Date End Date Taking? Authorizing Provider  albuterol (PROVENTIL HFA;VENTOLIN HFA) 108 (90 Base) MCG/ACT inhaler Inhale 2 puffs into the lungs every 6 (six) hours as needed for wheezing or shortness of breath.    Yes [provider]  alendronate (FOSAMAX) 70 MG tablet Takes 70mg  by mouth every 7 days on Mondays. 08/15/16  Yes [provider]  amLODipine (NORVASC) 10 MG tablet Take 10 mg by mouth daily.   Yes [provider]  aspirin EC 81 MG tablet Take 81 mg by mouth daily.   Yes [provider]  bimatoprost (LUMIGAN) 0.03 % ophthalmic drops Place 1 drop into both eyes at  bedtime.     Yes [provider]  esomeprazole (NEXIUM) 40 MG capsule Take 1 capsule (40 mg total) by mouth 2 (two) times daily before a meal. 06/19/17  Yes Tiffany Kocher, PA-C  HYDROcodone-acetaminophen (NORCO) 10-325 MG tablet Take 1 tablet by mouth every 6 (six) hours as needed for pain. 07/30/17  Yes [provider]  ipratropium-albuterol (DUONEB) 0.5-2.5 (3) MG/3ML SOLN Inhale 3 mLs into the lungs every 6 (six) hours as needed (wheezing or shortness of breath).  07/31/15  Yes [provider]  mometasone (ELOCON) 0.1 % cream Apply 1 application topically at bedtime. Applies to lower legs   Yes [provider]  Potassium Chloride ER 20 MEQ TBCR 2 PO BID FOR 3 DAYS 08/14/17  Yes Jazmynn Pho L, MD  pravastatin (PRAVACHOL) 40 MG tablet Take 40 mg by mouth daily. 06/30/15  Yes [provider]  traZODone (DESYREL) 50 MG tablet Take 50 mg by mouth at bedtime. 08/04/17  Yes [provider]    Allergies as of 06/19/2017 - Review Complete 06/19/2017  Allergen Reaction Noted  . Peanut-containing drug products Anaphylaxis, Itching, and Rash 03/31/2008  . Catapres [clonidine hcl] Other (See Comments) 09/16/2011  . Codeine Other (See Comments) 05/10/2008  . Morphine Other (See Comments) 05/10/2008    Family History  Problem Relation Age of Onset  . Aneurysm Mother        Cerebral  . Hypertension Mother   . Breast cancer Sister   .  Breast cancer Sister   . Colon cancer Neg Hx   . Colon polyps Neg Hx     Social History   Socioeconomic History  . Marital status: Widowed    Spouse name: Not on file  . Number of children: Not on file  . Years of education: Not on file  . Highest education level: Not on file  Occupational History  . Occupation: Retired LawyerCNA    Comment: [employer]  Social Needs  . Financial resource strain: Not on file  . Food insecurity:    Worry: Not on file    Inability: Not on file  . Transportation needs:     Medical: Not on file    Non-medical: Not on file  Tobacco Use  . Smoking status: Never Smoker  . Smokeless tobacco: Never Used  Substance and Sexual Activity  . Alcohol use: No  . Drug use: No  . Sexual activity: Never  Lifestyle  . Physical activity:    Days per week: Not on file    Minutes per session: Not on file  . Stress: Not on file  Relationships  . Social connections:    Talks on phone: Not on file    Gets together: Not on file    Attends religious service: Not on file    Active member of club or organization: Not on file    Attends meetings of clubs or organizations: Not on file    Relationship status: Not on file  . Intimate partner violence:    Fear of current or ex partner: Not on file    Emotionally abused: Not on file    Physically abused: Not on file    Forced sexual activity: Not on file  Other Topics Concern  . Not on file  Social History Narrative  . Not on file    Review of Systems: See HPI, otherwise negative ROS   Physical Exam: BP (!) 169/84   Pulse 74   Temp 97.9 F (36.6 C) (Oral)   Resp 15   SpO2 97%  General:   Alert,  pleasant and cooperative in NAD Head:  Normocephalic and atraumatic. Neck:  Supple; Lungs:  Clear throughout to auscultation.    Heart:  Regular rate and rhythm. Abdomen:  Soft, nontender and nondistended. Normal bowel sounds, without guarding, and without rebound.   Neurologic:  Alert and  oriented x4;  grossly normal neurologically.  Impression/Plan:     DYSPHAGIA/GERD  Plan: 1. EGD/?DIL TODAY.  DISCUSSED PROCEDURE, BENEFITS, & RISKS: < 1% chance of medication reaction, bleeding, OR perforation.

## 2017-08-21 NOTE — Telephone Encounter (Signed)
Called, many rings and no answer. Mailing a letter.

## 2017-08-22 ENCOUNTER — Encounter (HOSPITAL_COMMUNITY): Payer: Self-pay | Admitting: Gastroenterology

## 2017-08-29 ENCOUNTER — Other Ambulatory Visit: Payer: Self-pay

## 2017-08-29 NOTE — Patient Outreach (Signed)
[facility] Care Management  08/29/2017  Sheila AltesQueen E Myers 1928/10/16 [id number]   Medication Adherence call to Mrs. Sheila MooreQueen Myers spoke with patient she ask to contact [facility] and order both of her medication Pravastatin 40 mg and Amlodepine/Benazepril 10/20 mg [facility] will have it ready for patient to pick up. Mrs. Sheila Myers is showing past due under [facility].  Sheila AbedAna Myers CPhT Pharmacy Technician [facility] Care Management Direct Dial [phone]  Fax [phone] [email or web address]

## 2017-11-03 DIAGNOSIS — J449 Chronic obstructive pulmonary disease, unspecified: Secondary | ICD-10-CM | POA: Diagnosis not present

## 2017-12-03 ENCOUNTER — Ambulatory Visit (INDEPENDENT_AMBULATORY_CARE_PROVIDER_SITE_OTHER): Payer: Medicare Other

## 2017-12-03 ENCOUNTER — Encounter: Payer: Self-pay | Admitting: Orthopaedic Surgery

## 2017-12-03 ENCOUNTER — Ambulatory Visit (INDEPENDENT_AMBULATORY_CARE_PROVIDER_SITE_OTHER): Payer: Medicare Other | Admitting: Orthopaedic Surgery

## 2017-12-03 VITALS — BP 158/92 | HR 76 | Wt 98.0 lb

## 2017-12-03 DIAGNOSIS — M5441 Lumbago with sciatica, right side: Secondary | ICD-10-CM

## 2017-12-03 DIAGNOSIS — G8929 Other chronic pain: Secondary | ICD-10-CM | POA: Diagnosis not present

## 2017-12-03 NOTE — Progress Notes (Signed)
Patient Sheila Myers, female DOB:Apr 28, 1928, 84 y.o. AVW:[id number]  Chief Complaint  Patient presents with  . Back Pain    low back right hip and knee pain    HPI  Sheila Myers is a 84 y.o. female who has been having pain in the lower back with radiation down the right leg to below the right knee.  She has no falls, no trauma., no weakness.  She is active.  She has tried heat and Advil with no help.   Body mass index is 17.36 kg/m.  ROS  Review of Systems  Constitutional: Positive for activity change.  HENT: Negative for congestion.   Respiratory: Positive for shortness of breath. Negative for cough.   Cardiovascular: Negative for chest pain and leg swelling.  Endocrine: Positive for cold intolerance.  Musculoskeletal: Positive for arthralgias and back pain.  Allergic/Immunologic: Positive for environmental allergies.  All other systems reviewed and are negative.   All other systems reviewed and are negative.  The following is a summary of the past history medically, past history surgically, known current medicines, social history and family history.  This information is gathered electronically by the computer from prior information and documentation.  I review this each visit and have found including this information at this point in the chart is beneficial and informative.    Past Medical History:  Diagnosis Date  . Anxiety   . ASCVD (arteriosclerotic cardiovascular disease)    H/o PCI  . Asthma   . Chronic back pain   . Chronic obstructive pulmonary disease (HCC)   . Degenerative joint disease    Right shoulder pain-mechanism unclear  . Depression   . Diabetes mellitus type II   . Gastroesophageal reflux disease    Diverticulosis, hemorrhoids with rectal bleeding,  . Glaucoma   . Glaucoma   . Hyperlipidemia   . Hypertension   . Irritable bowel syndrome    constipation alternating with diarrhea  . Stroke [facility]) 2017   TIA    Past Surgical  History:  Procedure Laterality Date  . ABDOMINAL HYSTERECTOMY    . APPENDECTOMY    . CHOLECYSTECTOMY    . COLONOSCOPY WITH ESOPHAGOGASTRODUODENOSCOPY (EGD)  2007   Dr. Darrick Penna: diverticulosis, external hemorrhoids, hiatal hernia, patent Schatzki ring.  . CORONARY ANGIOPLASTY WITH STENT PLACEMENT  1991  . ESOPHAGOGASTRODUODENOSCOPY  2012   Dr. Darrick Penna: Peptic stricture versus ring status post dilation, gastric inflammation and polyps,  biopsies unremarkable.  . ESOPHAGOGASTRODUODENOSCOPY (EGD) WITH PROPOFOL N/A 08/19/2017   Procedure: ESOPHAGOGASTRODUODENOSCOPY (EGD) WITH PROPOFOL;  Surgeon: West Bali, MD;  [facility];  Service: Endoscopy;  Laterality: N/A;  2:15pm  . SAVORY DILATION N/A 08/19/2017   Procedure: SAVORY DILATION;  Surgeon: West Bali, MD;  Location: [facility];  Service: Endoscopy;  Laterality: N/A;    Family History  Problem Relation Age of Onset  . Aneurysm Mother        Cerebral  . Hypertension Mother   . Breast cancer Sister   . Breast cancer Sister   . Colon cancer Neg Hx   . Colon polyps Neg Hx     Social History Social History   Tobacco Use  . Smoking status: Never Smoker  . Smokeless tobacco: Never Used  Substance Use Topics  . Alcohol use: No  . Drug use: No    Allergies  Allergen Reactions  . Peanut-Containing Drug Products Anaphylaxis, Itching and Rash    Allergy not confirmed-patient also mentions a rash and  itching when eating peanut butter  . Catapres [Clonidine Hcl] Other (See Comments)    Dry mouth  . Codeine Other (See Comments)    "I just freak out"  . Morphine Other (See Comments)    "I just freak out"    Current Outpatient Medications  Medication Sig Dispense Refill  . albuterol (PROVENTIL HFA;VENTOLIN HFA) 108 (90 Base) MCG/ACT inhaler Inhale 2 puffs into the lungs every 6 (six) hours as needed for wheezing or shortness of breath.     Marland Kitchen alendronate (FOSAMAX) 70 MG tablet Takes 70mg  by mouth every 7 days on  Mondays.  10  . amLODipine (NORVASC) 10 MG tablet Take 10 mg by mouth daily.    Marland Kitchen aspirin EC 81 MG tablet Take 81 mg by mouth daily.    . bimatoprost (LUMIGAN) 0.03 % ophthalmic drops Place 1 drop into both eyes at bedtime.      Marland Kitchen esomeprazole (NEXIUM) 40 MG capsule Take 1 capsule (40 mg total) by mouth 2 (two) times daily before a meal. 60 capsule 5  . HYDROcodone-acetaminophen (NORCO) 10-325 MG tablet Take 1 tablet by mouth every 6 (six) hours as needed for pain.  0  . ipratropium-albuterol (DUONEB) 0.5-2.5 (3) MG/3ML SOLN Inhale 3 mLs into the lungs every 6 (six) hours as needed (wheezing or shortness of breath).     . mometasone (ELOCON) 0.1 % cream Apply 1 application topically at bedtime. Applies to lower legs    . Potassium Chloride ER 20 MEQ TBCR TAKE 2 TABLETS BY MOUTH TWICE DAILY FOR 3 DAYS 12 tablet 0  . pravastatin (PRAVACHOL) 40 MG tablet Take 40 mg by mouth daily.  0  . traZODone (DESYREL) 50 MG tablet Take 50 mg by mouth at bedtime.  5   No current facility-administered medications for this visit.      Physical Exam  Blood pressure (!) 158/92, pulse 76, weight 98 lb (44.5 kg).  Constitutional: overall normal hygiene, normal nutrition, well developed, normal grooming, normal body habitus. Assistive device:cane  Musculoskeletal: gait and station Limp right, muscle tone and strength are normal, no tremors or atrophy is present.  .  Neurological: coordination overall normal.  Deep tendon reflex/nerve stretch intact.  Sensation normal.  Cranial nerves II-XII intact.   Skin:   Normal overall no scars, lesions, ulcers or rashes. No psoriasis.  Psychiatric: Alert and oriented x 3.  Recent memory intact, remote memory unclear.  Normal mood and affect. Well groomed.  Good eye contact.  Cardiovascular: overall no swelling, no varicosities, no edema bilaterally, normal temperatures of the legs and arms, no clubbing, cyanosis and good capillary refill.  Lymphatic: palpation is  normal.  Spine/Pelvis examination:  Inspection:  Overall, sacoiliac joint benign and hips nontender; without crepitus or defects.   Thoracic spine inspection: Alignment normal without kyphosis present   Lumbar spine inspection:  Alignment  with normal lumbar lordosis, without scoliosis apparent.   Thoracic spine palpation:  without tenderness of spinal processes   Lumbar spine palpation: without tenderness of lumbar area; without tightness of lumbar muscles    Range of Motion:   Lumbar flexion, forward flexion is normal without pain or tenderness    Lumbar extension is full without pain or tenderness   Left lateral bend is normal without pain or tenderness   Right lateral bend is normal without pain or tenderness   Straight leg raising is normal  Strength & tone: normal   Stability overall normal stability All other systems reviewed and are negative  The patient has been educated about the nature of the problem(s) and counseled on treatment options.  The patient appeared to understand what I have discussed and is in agreement with it.  Encounter Diagnosis  Name Primary?  . Chronic right-sided low back pain with right-sided sciatica Yes   X-rays were done of the lumbar spine, reported separately.  PLAN Call if any problems.  Precautions discussed.  Continue current medications.   Return to clinic after MRI of the lumbar spine.  Samples of Aleve given.   Electronically Signed Darreld Mclean, MD 11/6/20192:12 PM

## 2017-12-03 NOTE — Patient Instructions (Signed)
An MRI has been scheduled for you at [facility] for Tuesday 12/09/17.  You will need to arrive in the Radiology Department at 1:30pm to be ready for a 2:00pm exam.

## 2017-12-09 ENCOUNTER — Ambulatory Visit (HOSPITAL_COMMUNITY)
Admission: RE | Admit: 2017-12-09 | Discharge: 2017-12-09 | Disposition: A | Discharge status: Home / Self Care | Payer: Medicare Other | Source: Ambulatory Visit | Attending: Orthopaedic Surgery | Admitting: Orthopaedic Surgery

## 2017-12-09 DIAGNOSIS — M5136 Other intervertebral disc degeneration, lumbar region: Secondary | ICD-10-CM | POA: Insufficient documentation

## 2017-12-09 DIAGNOSIS — M4856XA Collapsed vertebra, not elsewhere classified, lumbar region, initial encounter for fracture: Secondary | ICD-10-CM | POA: Insufficient documentation

## 2017-12-09 DIAGNOSIS — M545 Low back pain: Secondary | ICD-10-CM | POA: Diagnosis not present

## 2017-12-09 DIAGNOSIS — M5441 Lumbago with sciatica, right side: Secondary | ICD-10-CM | POA: Diagnosis not present

## 2017-12-09 DIAGNOSIS — G8929 Other chronic pain: Secondary | ICD-10-CM | POA: Insufficient documentation

## 2017-12-09 DIAGNOSIS — M4804 Spinal stenosis, thoracic region: Secondary | ICD-10-CM | POA: Insufficient documentation

## 2017-12-09 DIAGNOSIS — R0609 Other forms of dyspnea: Secondary | ICD-10-CM | POA: Insufficient documentation

## 2017-12-09 DIAGNOSIS — M48061 Spinal stenosis, lumbar region without neurogenic claudication: Secondary | ICD-10-CM | POA: Diagnosis not present

## 2017-12-10 ENCOUNTER — Ambulatory Visit (INDEPENDENT_AMBULATORY_CARE_PROVIDER_SITE_OTHER): Payer: Medicare Other | Admitting: Orthopaedic Surgery

## 2017-12-10 ENCOUNTER — Encounter: Payer: Self-pay | Admitting: Orthopaedic Surgery

## 2017-12-10 VITALS — BP 161/82 | HR 81 | Ht 63.0 in | Wt 126.0 lb

## 2017-12-10 DIAGNOSIS — G8929 Other chronic pain: Secondary | ICD-10-CM | POA: Diagnosis not present

## 2017-12-10 DIAGNOSIS — M5441 Lumbago with sciatica, right side: Secondary | ICD-10-CM | POA: Diagnosis not present

## 2017-12-10 NOTE — Progress Notes (Signed)
Patient Sheila:XWRUE Virgilio Frees, female DOB:1928/12/16, 84 y.o. AVW:[id number]  Chief Complaint  Patient presents with  . Back Pain    MRI results L-Spine    HPI  Sheila Myers is a 84 y.o. female who has continued lower back pain.  She had a MRI which showed: IMPRESSION: 1. No acute osseous abnormality aside from degenerative endplate marrow edema on the right at L3-L4. A mild L1 compression fracture is new chronic. 2. Degenerative moderate to severe lumbar spinal and/or lateral recess stenosis at L2-L3, L3-L4 and L4-L5. 3. Moderate to severe neural foraminal stenosis at the right L3 and bilateral L5 nerve levels. 4. Mild lower thoracic spinal stenosis, and up to mild spinal cord mass effect at T10-T11. No signal abnormality in the visible lower thoracic spinal cord or conus.  I have explained the findings to her.  I have recommended epidural injection.  At first she agreed but before leaving she said no, she wanted to think about it further.  She had it done years ago and it helped but she is very fearful of a needle.   Body mass index is 22.32 kg/m.  ROS  Review of Systems  Constitutional: Positive for activity change.  HENT: Negative for congestion.   Respiratory: Positive for shortness of breath. Negative for cough.   Cardiovascular: Negative for chest pain and leg swelling.  Endocrine: Positive for cold intolerance.  Musculoskeletal: Positive for arthralgias and back pain.  Allergic/Immunologic: Positive for environmental allergies.  All other systems reviewed and are negative.   All other systems reviewed and are negative.  The following is a summary of the past history medically, past history surgically, known current medicines, social history and family history.  This information is gathered electronically by the computer from prior information and documentation.  I review this each visit and have found including this information at this point in the chart is  beneficial and informative.    Past Medical History:  Diagnosis Date  . Anxiety   . ASCVD (arteriosclerotic cardiovascular disease)    H/o PCI  . Asthma   . Chronic back pain   . Chronic obstructive pulmonary disease (HCC)   . Degenerative joint disease    Right shoulder pain-mechanism unclear  . Depression   . Diabetes mellitus type II   . Gastroesophageal reflux disease    Diverticulosis, hemorrhoids with rectal bleeding,  . Glaucoma   . Glaucoma   . Hyperlipidemia   . Hypertension   . Irritable bowel syndrome    constipation alternating with diarrhea  . Stroke [facility]) 2017   TIA    Past Surgical History:  Procedure Laterality Date  . ABDOMINAL HYSTERECTOMY    . APPENDECTOMY    . CHOLECYSTECTOMY    . COLONOSCOPY WITH ESOPHAGOGASTRODUODENOSCOPY (EGD)  2007   Dr. Darrick Penna: diverticulosis, external hemorrhoids, hiatal hernia, patent Schatzki ring.  . CORONARY ANGIOPLASTY WITH STENT PLACEMENT  1991  . ESOPHAGOGASTRODUODENOSCOPY  2012   Dr. Darrick Penna: Peptic stricture versus ring status post dilation, gastric inflammation and polyps,  biopsies unremarkable.  . ESOPHAGOGASTRODUODENOSCOPY (EGD) WITH PROPOFOL N/A 08/19/2017   Procedure: ESOPHAGOGASTRODUODENOSCOPY (EGD) WITH PROPOFOL;  Surgeon: West Bali, MD;  Location[facility];  Service: Endoscopy;  Laterality: N/A;  2:15pm  . SAVORY DILATION N/A 08/19/2017   Procedure: SAVORY DILATION;  Surgeon: West Bali, MD;  Location: [facility];  Service: Endoscopy;  Laterality: N/A;    Family History  Problem Relation Age of Onset  . Aneurysm Mother  Cerebral  . Hypertension Mother   . Breast cancer Sister   . Breast cancer Sister   . Colon cancer Neg Hx   . Colon polyps Neg Hx     Social History Social History   Tobacco Use  . Smoking status: Never Smoker  . Smokeless tobacco: Never Used  Substance Use Topics  . Alcohol use: No  . Drug use: No    Allergies  Allergen Reactions  . Peanut-Containing  Drug Products Anaphylaxis, Itching and Rash    Allergy not confirmed-patient also mentions a rash and itching when eating peanut butter  . Catapres [Clonidine Hcl] Other (See Comments)    Dry mouth  . Codeine Other (See Comments)    "I just freak out"  . Morphine Other (See Comments)    "I just freak out"    Current Outpatient Medications  Medication Sig Dispense Refill  . albuterol (PROVENTIL HFA;VENTOLIN HFA) 108 (90 Base) MCG/ACT inhaler Inhale 2 puffs into the lungs every 6 (six) hours as needed for wheezing or shortness of breath.     Marland Kitchen alendronate (FOSAMAX) 70 MG tablet Takes 70mg  by mouth every 7 days on Mondays.  10  . amLODipine (NORVASC) 10 MG tablet Take 10 mg by mouth daily.    Marland Kitchen aspirin EC 81 MG tablet Take 81 mg by mouth daily.    . bimatoprost (LUMIGAN) 0.03 % ophthalmic drops Place 1 drop into both eyes at bedtime.      Marland Kitchen esomeprazole (NEXIUM) 40 MG capsule Take 1 capsule (40 mg total) by mouth 2 (two) times daily before a meal. 60 capsule 5  . HYDROcodone-acetaminophen (NORCO) 10-325 MG tablet Take 1 tablet by mouth every 6 (six) hours as needed for pain.  0  . ipratropium-albuterol (DUONEB) 0.5-2.5 (3) MG/3ML SOLN Inhale 3 mLs into the lungs every 6 (six) hours as needed (wheezing or shortness of breath).     . mometasone (ELOCON) 0.1 % cream Apply 1 application topically at bedtime. Applies to lower legs    . Potassium Chloride ER 20 MEQ TBCR TAKE 2 TABLETS BY MOUTH TWICE DAILY FOR 3 DAYS 12 tablet 0  . pravastatin (PRAVACHOL) 40 MG tablet Take 40 mg by mouth daily.  0  . traZODone (DESYREL) 50 MG tablet Take 50 mg by mouth at bedtime.  5   No current facility-administered medications for this visit.      Physical Exam  Blood pressure (!) 161/82, pulse 81, height 5\' 3"  (1.6 m), weight 126 lb (57.2 kg).  Constitutional: overall normal hygiene, normal nutrition, well developed, normal grooming, normal body habitus. Assistive device:cane  Musculoskeletal: gait and  station Limp right, muscle tone and strength are normal, no tremors or atrophy is present.  .  Neurological: coordination overall normal.  Deep tendon reflex/nerve stretch intact.  Sensation normal.  Cranial nerves II-XII intact.   Skin:   Normal overall no scars, lesions, ulcers or rashes. No psoriasis.  Psychiatric: Alert and oriented x 3.  Recent memory intact, remote memory unclear.  Normal mood and affect. Well groomed.  Good eye contact.  Cardiovascular: overall no swelling, no varicosities, no edema bilaterally, normal temperatures of the legs and arms, no clubbing, cyanosis and good capillary refill.  Lymphatic: palpation is normal.  Spine/Pelvis examination:  Inspection:  Overall, sacoiliac joint benign and hips nontender; without crepitus or defects.   Thoracic spine inspection: Alignment normal without kyphosis present   Lumbar spine inspection:  Alignment  with normal lumbar lordosis, without scoliosis apparent.  Thoracic spine palpation:  without tenderness of spinal processes   Lumbar spine palpation: without tenderness of lumbar area; without tightness of lumbar muscles    Range of Motion:   Lumbar flexion, forward flexion is 35 degrees without pain or tenderness    Lumbar extension is full without pain or tenderness   Left lateral bend is normal without pain or tenderness   Right lateral bend is normal without pain or tenderness   Straight leg raising is normal  Strength & tone: normal   Stability overall normal stability All other systems reviewed and are negative   The patient has been educated about the nature of the problem(s) and counseled on treatment options.  The patient appeared to understand what I have discussed and is in agreement with it.  Encounter Diagnosis  Name Primary?  . Chronic right-sided low back pain with right-sided sciatica Yes    PLAN Call if any problems.  Precautions discussed.  Continue current medications.   Return to clinic 1  month   Call if she wants the epidural.  Electronically Signed Darreld McleanWayne Skiler Tye, MD 11/13/20192:23 PM

## 2017-12-22 ENCOUNTER — Other Ambulatory Visit: Payer: Self-pay | Admitting: Gastroenterology

## 2017-12-23 ENCOUNTER — Ambulatory Visit (INDEPENDENT_AMBULATORY_CARE_PROVIDER_SITE_OTHER): Payer: Medicare Other | Admitting: Nurse Practitioner

## 2017-12-23 ENCOUNTER — Encounter: Payer: Self-pay | Admitting: Nurse Practitioner

## 2017-12-23 VITALS — BP 160/80 | HR 66 | Temp 97.2°F | Ht 63.0 in | Wt 125.8 lb

## 2017-12-23 DIAGNOSIS — R1319 Other dysphagia: Secondary | ICD-10-CM

## 2017-12-23 DIAGNOSIS — R1310 Dysphagia, unspecified: Secondary | ICD-10-CM

## 2017-12-23 DIAGNOSIS — K219 Gastro-esophageal reflux disease without esophagitis: Secondary | ICD-10-CM

## 2017-12-23 NOTE — Progress Notes (Signed)
Referring Provider: Kari BaarsHawkins, Edward, MD Primary Care Physician:  Kari BaarsHawkins, Edward, MD Primary GI:  Dr. Darrick PennaFields  Chief Complaint  Patient presents with  . Gastroesophageal Reflux    f/u.    HPI:   Sheila Myers is a 84 y.o. female who presents for post procedure follow-up.  Patient was last seen in the office 06/19/2017 for GERD, dysphagia, epigastric abdominal pain.  Barium pill esophagram April 26 with age-related dysmotility, incomplete clearance of barium by primary peristaltic waves, intermittent retrograde peristalsis, no esophageal mass or stricture, tiny Zenker's diverticulum.  At her last visit patient was doing well on Nexium twice daily in 2017 but her insurance would no longer cover.  She was started on pantoprazole twice daily a year ago but felt like her reflux has not been controlled.  No vomiting, fullness and discomfort in the substernal region/epigastric region with meals which is worse when she was in a full brace for spinal fracture.  No pill dysphasia.  No other GI symptoms.  Recommended stop pantoprazole and start Nexium twice daily initially with the hope of reducing to once daily.  Also recommended EGD.  EGD completed 08/19/2017 which found benign-appearing esophageal stenosis status post dilation, mild gastritis due to aspirin.  Recommended continue current medications and continue attempt to get Nexium, follow-up in 4 months, if persistent dysphasia consider esophageal manometry/pH probe.  Today she states she's doing ok overall. She's taking Nexium bid. States she's back on the generic which doesn't work as well. Swallowing improved after EGD. States her neck is breaking out. Denies abdominal pain, N/V, hematochezia, melena. She's been having leg and back problems and her PCP started her on Aleve. Denies chest pain, dyspnea, dizziness, lightheadedness, syncope, near syncope. Denies any other upper or lower GI symptoms.  Past Medical History:  Diagnosis Date  .  Anxiety   . ASCVD (arteriosclerotic cardiovascular disease)    H/o PCI  . Asthma   . Chronic back pain   . Chronic obstructive pulmonary disease (HCC)   . Degenerative joint disease    Right shoulder pain-mechanism unclear  . Depression   . Diabetes mellitus type II   . Gastroesophageal reflux disease    Diverticulosis, hemorrhoids with rectal bleeding,  . Glaucoma   . Glaucoma   . Hyperlipidemia   . Hypertension   . Irritable bowel syndrome    constipation alternating with diarrhea  . Stroke [facility](HCC) 2017   TIA    Past Surgical History:  Procedure Laterality Date  . ABDOMINAL HYSTERECTOMY    . APPENDECTOMY    . CHOLECYSTECTOMY    . COLONOSCOPY WITH ESOPHAGOGASTRODUODENOSCOPY (EGD)  2007   Dr. Darrick PennaFields: diverticulosis, external hemorrhoids, hiatal hernia, patent Schatzki ring.  . CORONARY ANGIOPLASTY WITH STENT PLACEMENT  1991  . ESOPHAGOGASTRODUODENOSCOPY  2012   Dr. Darrick Pennafields: Peptic stricture versus ring status post dilation, gastric inflammation and polyps,  biopsies unremarkable.  . ESOPHAGOGASTRODUODENOSCOPY (EGD) WITH PROPOFOL N/A 08/19/2017   Procedure: ESOPHAGOGASTRODUODENOSCOPY (EGD) WITH PROPOFOL;  Surgeon: West BaliFields, Sandi L, MD;  [facility];  Service: Endoscopy;  Laterality: N/A;  2:15pm  . SAVORY DILATION N/A 08/19/2017   Procedure: SAVORY DILATION;  Surgeon: West BaliFields, Sandi L, MD;  Location: [facility];  Service: Endoscopy;  Laterality: N/A;    Current Outpatient Medications  Medication Sig Dispense Refill  . albuterol (PROVENTIL HFA;VENTOLIN HFA) 108 (90 Base) MCG/ACT inhaler Inhale 2 puffs into the lungs every 6 (six) hours as needed for wheezing or shortness of breath.     .Marland Kitchen  alendronate (FOSAMAX) 70 MG tablet Takes 70mg  by mouth every 7 days on Mondays.  10  . amLODipine (NORVASC) 10 MG tablet Take 10 mg by mouth daily.    Marland Kitchen aspirin EC 81 MG tablet Take 81 mg by mouth daily.    . bimatoprost (LUMIGAN) 0.03 % ophthalmic drops Place 1 drop into both eyes  at bedtime.      Marland Kitchen esomeprazole (NEXIUM) 40 MG capsule Take 1 capsule (40 mg total) by mouth 2 (two) times daily before a meal. 60 capsule 5  . HYDROcodone-acetaminophen (NORCO) 10-325 MG tablet Take 1 tablet by mouth every 6 (six) hours as needed for pain.  0  . ipratropium-albuterol (DUONEB) 0.5-2.5 (3) MG/3ML SOLN Inhale 3 mLs into the lungs every 6 (six) hours as needed (wheezing or shortness of breath).     . mometasone (ELOCON) 0.1 % cream Apply 1 application topically at bedtime. Applies to lower legs    . Potassium Chloride ER 20 MEQ TBCR TAKE 2 TABLETS BY MOUTH TWICE DAILY FOR 3 DAYS 12 tablet 0  . pravastatin (PRAVACHOL) 40 MG tablet Take 40 mg by mouth daily.  0  . traZODone (DESYREL) 50 MG tablet Take 50 mg by mouth at bedtime.  5   No current facility-administered medications for this visit.     Allergies as of 12/23/2017 - Review Complete 12/23/2017  Allergen Reaction Noted  . Peanut-containing drug products Anaphylaxis, Itching, and Rash 03/31/2008  . Catapres [clonidine hcl] Other (See Comments) 09/16/2011  . Codeine Other (See Comments) 05/10/2008  . Morphine Other (See Comments) 05/10/2008    Family History  Problem Relation Age of Onset  . Aneurysm Mother        Cerebral  . Hypertension Mother   . Breast cancer Sister   . Breast cancer Sister   . Colon cancer Neg Hx   . Colon polyps Neg Hx     Social History   Socioeconomic History  . Marital status: Widowed    Spouse name: Not on file  . Number of children: Not on file  . Years of education: Not on file  . Highest education level: Not on file  Occupational History  . Occupation: Retired Lawyer    Comment: [employer]  Social Needs  . Financial resource strain: Not on file  . Food insecurity:    Worry: Not on file    Inability: Not on file  . Transportation needs:    Medical: Not on file    Non-medical: Not on file  Tobacco Use  . Smoking status: Never Smoker  . Smokeless tobacco: Never Used   Substance and Sexual Activity  . Alcohol use: No  . Drug use: No  . Sexual activity: Never  Lifestyle  . Physical activity:    Days per week: Not on file    Minutes per session: Not on file  . Stress: Not on file  Relationships  . Social connections:    Talks on phone: Not on file    Gets together: Not on file    Attends religious service: Not on file    Active member of club or organization: Not on file    Attends meetings of clubs or organizations: Not on file    Relationship status: Not on file  Other Topics Concern  . Not on file  Social History Narrative  . Not on file    Review of Systems: Complete ROS negative except as per HPI.\   Physical Exam: BP Marland Kitchen)  160/80   Pulse 66   Temp (!) 97.2 F (36.2 C) (Oral)   Ht 5\' 3"  (1.6 m)   Wt 125 lb 12.8 oz (57.1 kg)   BMI 22.28 kg/m  General:   Alert and oriented. Pleasant and cooperative. Well-nourished and well-developed.  Eyes:  Without icterus, sclera clear and conjunctiva pink.  Ears:  Normal auditory acuity. Cardiovascular:  S1, S2 present without murmurs appreciated. Extremities without clubbing or edema. Respiratory:  Clear to auscultation bilaterally. No wheezes, rales, or rhonchi. No distress.  Gastrointestinal:  +BS, soft, non-tender and non-distended. No HSM noted. No guarding or rebound. No masses appreciated.  Rectal:  Deferred  Musculoskalatal:  Symmetrical without gross deformities. Skin:  Intact without significant lesions or rashes. Neurologic:  Alert and oriented x4;  grossly normal neurologically. Psych:  Alert and cooperative. Normal mood and affect. Heme/Lymph/Immune: No excessive bruising noted.    12/23/2017 8:50 AM   Disclaimer: This note was dictated with voice recognition software. Similar sounding words can inadvertently be transcribed and may not be corrected upon review.

## 2017-12-23 NOTE — Progress Notes (Signed)
cc'ed to pcp °

## 2017-12-23 NOTE — Patient Instructions (Signed)
1. Continue taking her current medications. 2. When it is time for a refill of your acid blocker (Nexium) remind us that you need name brand only. 3. Return for follow-up in 6 months. 4. Call us if you have any questions or concerns.  At [facility] we value your feedback. You may receive a survey about your visit today. Please share your experience as we strive to create trusting relationships with our patients to provide genuine, compassionate, quality care.  We appreciate your understanding and patience as we review any laboratory studies, imaging, and other diagnostic tests that are ordered as we care for you. Our office policy is 5 business days for review of these results, and any emergent or urgent results are addressed in a timely manner for your best interest. If you do not hear from our office in 1 week, please contact us.   We also encourage the use of MyChart, which contains your medical information for your review as well. If you are not enrolled in this feature, an access code is on this after visit summary for your convenience. Thank you for allowing us to be involved in your care.  It was great to see you today!  I hope you have a Happy Thanksgiving!!

## 2017-12-23 NOTE — Assessment & Plan Note (Signed)
Dysphagia significantly improved after EGD with dilation and change of PPI.  Recommend she continue her current medications.  Per her request we will refill her medication, when it is time for refill, with name brand Nexium which she feels works better than the generic esomeprazole.  Follow-up in 6 months.

## 2017-12-23 NOTE — Assessment & Plan Note (Signed)
Improved on esomeprazole.  She is requesting name brand as she feels it works better.  Generally her GERD symptoms are doing well but she requires PPI twice daily.  We will keep her on this for now.  We will attempt to refill her next prescription with namebrand only.  I have asked her to remind us when she calls for refill.  Follow-up in 6 months.

## 2017-12-31 DIAGNOSIS — Z23 Encounter for immunization: Secondary | ICD-10-CM | POA: Diagnosis not present

## 2018-01-03 DIAGNOSIS — J449 Chronic obstructive pulmonary disease, unspecified: Secondary | ICD-10-CM | POA: Diagnosis not present

## 2018-01-07 ENCOUNTER — Encounter: Payer: Self-pay | Admitting: Orthopaedic Surgery

## 2018-01-07 ENCOUNTER — Ambulatory Visit (INDEPENDENT_AMBULATORY_CARE_PROVIDER_SITE_OTHER): Payer: Medicare Other | Admitting: Orthopaedic Surgery

## 2018-01-07 VITALS — BP 171/85 | HR 73 | Wt 126.0 lb

## 2018-01-07 DIAGNOSIS — G8929 Other chronic pain: Secondary | ICD-10-CM

## 2018-01-07 DIAGNOSIS — M5441 Lumbago with sciatica, right side: Secondary | ICD-10-CM

## 2018-01-07 NOTE — Progress Notes (Signed)
Patient UE:AVWUJ:Sheila Myers, female DOB:May 22, 1928, 84 y.o. WJX:[id number]  Chief Complaint  Patient presents with  . Back Pain    HPI  Sheila Myers is a 84 y.o. female who has lower back pain that is worse with cold weather.  She is a little better. She is moving better. She has no weakness.  She has no new trauma.  She says she is used to pain due to her age.  She wants to be seen as needed.  I told her she could do that with no problem.   Body mass index is 22.32 kg/m.  ROS  Review of Systems  Constitutional: Positive for activity change.  HENT: Negative for congestion.   Respiratory: Positive for shortness of breath. Negative for cough.   Cardiovascular: Negative for chest pain and leg swelling.  Endocrine: Positive for cold intolerance.  Musculoskeletal: Positive for arthralgias and back pain.  Allergic/Immunologic: Positive for environmental allergies.  All other systems reviewed and are negative.   All other systems reviewed and are negative.  The following is a summary of the past history medically, past history surgically, known current medicines, social history and family history.  This information is gathered electronically by the computer from prior information and documentation.  I review this each visit and have found including this information at this point in the chart is beneficial and informative.    Past Medical History:  Diagnosis Date  . Anxiety   . ASCVD (arteriosclerotic cardiovascular disease)    H/o PCI  . Asthma   . Chronic back pain   . Chronic obstructive pulmonary disease (HCC)   . Degenerative joint disease    Right shoulder pain-mechanism unclear  . Depression   . Diabetes mellitus type II   . Gastroesophageal reflux disease    Diverticulosis, hemorrhoids with rectal bleeding,  . Glaucoma   . Glaucoma   . Hyperlipidemia   . Hypertension   . Irritable bowel syndrome    constipation alternating with diarrhea  . Stroke [facility](HCC) 2017   TIA    Past Surgical History:  Procedure Laterality Date  . ABDOMINAL HYSTERECTOMY    . APPENDECTOMY    . CHOLECYSTECTOMY    . COLONOSCOPY WITH ESOPHAGOGASTRODUODENOSCOPY (EGD)  2007   Dr. Darrick PennaFields: diverticulosis, external hemorrhoids, hiatal hernia, patent Schatzki ring.  . CORONARY ANGIOPLASTY WITH STENT PLACEMENT  1991  . ESOPHAGOGASTRODUODENOSCOPY  2012   Dr. Darrick Pennafields: Peptic stricture versus ring status post dilation, gastric inflammation and polyps,  biopsies unremarkable.  . ESOPHAGOGASTRODUODENOSCOPY (EGD) WITH PROPOFOL N/A 08/19/2017   Procedure: ESOPHAGOGASTRODUODENOSCOPY (EGD) WITH PROPOFOL;  Surgeon: West BaliFields, Sandi L, MD;  [facility];  Service: Endoscopy;  Laterality: N/A;  2:15pm  . SAVORY DILATION N/A 08/19/2017   Procedure: SAVORY DILATION;  Surgeon: West BaliFields, Sandi L, MD;  [facility];  Service: Endoscopy;  Laterality: N/A;    Family History  Problem Relation Age of Onset  . Aneurysm Mother        Cerebral  . Hypertension Mother   . Breast cancer Sister   . Breast cancer Sister   . Colon cancer Neg Hx   . Colon polyps Neg Hx     Social History Social History   Tobacco Use  . Smoking status: Never Smoker  . Smokeless tobacco: Never Used  Substance Use Topics  . Alcohol use: No  . Drug use: No    Allergies  Allergen Reactions  . Peanut-Containing Drug Products Anaphylaxis, Itching and Rash  Allergy not confirmed-patient also mentions a rash and itching when eating peanut butter  . Catapres [Clonidine Hcl] Other (See Comments)    Dry mouth  . Codeine Other (See Comments)    "I just freak out"  . Morphine Other (See Comments)    "I just freak out"    Current Outpatient Medications  Medication Sig Dispense Refill  . albuterol (PROVENTIL HFA;VENTOLIN HFA) 108 (90 Base) MCG/ACT inhaler Inhale 2 puffs into the lungs every 6 (six) hours as needed for wheezing or shortness of breath.     Marland Kitchen alendronate (FOSAMAX) 70 MG tablet Takes 70mg   by mouth every 7 days on Mondays.  10  . amLODipine (NORVASC) 10 MG tablet Take 10 mg by mouth daily.    Marland Kitchen aspirin EC 81 MG tablet Take 81 mg by mouth daily.    . bimatoprost (LUMIGAN) 0.03 % ophthalmic drops Place 1 drop into both eyes at bedtime.      Marland Kitchen esomeprazole (NEXIUM) 40 MG capsule Take 1 capsule (40 mg total) by mouth 2 (two) times daily before a meal. 60 capsule 5  . esomeprazole (NEXIUM) 40 MG capsule TAKE 1 CAPSULE(40 MG) BY MOUTH TWICE DAILY BEFORE A MEAL 60 capsule 2  . HYDROcodone-acetaminophen (NORCO) 10-325 MG tablet Take 1 tablet by mouth every 6 (six) hours as needed for pain.  0  . ipratropium-albuterol (DUONEB) 0.5-2.5 (3) MG/3ML SOLN Inhale 3 mLs into the lungs every 6 (six) hours as needed (wheezing or shortness of breath).     . mometasone (ELOCON) 0.1 % cream Apply 1 application topically at bedtime. Applies to lower legs    . Potassium Chloride ER 20 MEQ TBCR TAKE 2 TABLETS BY MOUTH TWICE DAILY FOR 3 DAYS 12 tablet 0  . pravastatin (PRAVACHOL) 40 MG tablet Take 40 mg by mouth daily.  0  . traZODone (DESYREL) 50 MG tablet Take 50 mg by mouth at bedtime.  5   No current facility-administered medications for this visit.      Physical Exam  Blood pressure (!) 171/85, pulse 73, weight 126 lb (57.2 kg).  Constitutional: overall normal hygiene, normal nutrition, well developed, normal grooming, normal body habitus. Assistive device:none  Musculoskeletal: gait and station Limp none, muscle tone and strength are normal, no tremors or atrophy is present.  .  Neurological: coordination overall normal.  Deep tendon reflex/nerve stretch intact.  Sensation normal.  Cranial nerves II-XII intact.   Skin:   Normal overall no scars, lesions, ulcers or rashes. No psoriasis.  Psychiatric: Alert and oriented x 3.  Recent memory intact, remote memory unclear.  Normal mood and affect. Well groomed.  Good eye contact.  Cardiovascular: overall no swelling, no varicosities, no edema  bilaterally, normal temperatures of the legs and arms, no clubbing, cyanosis and good capillary refill.  Lymphatic: palpation is normal.  Spine/Pelvis examination:  Inspection:  Overall, sacoiliac joint benign and hips nontender; without crepitus or defects.   Thoracic spine inspection: Alignment normal without kyphosis present   Lumbar spine inspection:  Alignment  with normal lumbar lordosis, without scoliosis apparent.   Thoracic spine palpation:  without tenderness of spinal processes   Lumbar spine palpation: without tenderness of lumbar area; without tightness of lumbar muscles    Range of Motion:   Lumbar flexion, forward flexion is normal without pain or tenderness    Lumbar extension is full without pain or tenderness   Left lateral bend is normal without pain or tenderness   Right lateral bend is normal  without pain or tenderness   Straight leg raising is normal  Strength & tone: normal   Stability overall normal stability  All other systems reviewed and are negative   The patient has been educated about the nature of the problem(s) and counseled on treatment options.  The patient appeared to understand what I have discussed and is in agreement with it.  Encounter Diagnosis  Name Primary?  . Chronic right-sided low back pain with right-sided sciatica Yes    PLAN Call if any problems.  Precautions discussed.  Continue current medications.   Return to clinic prn   Electronically Signed Darreld Mclean, MD 12/11/20192:05 PM

## 2018-02-03 DIAGNOSIS — J449 Chronic obstructive pulmonary disease, unspecified: Secondary | ICD-10-CM | POA: Diagnosis not present

## 2018-02-09 DIAGNOSIS — I10 Essential (primary) hypertension: Secondary | ICD-10-CM | POA: Diagnosis not present

## 2018-02-09 DIAGNOSIS — Z79891 Long term (current) use of opiate analgesic: Secondary | ICD-10-CM | POA: Diagnosis not present

## 2018-02-09 DIAGNOSIS — M545 Low back pain: Secondary | ICD-10-CM | POA: Diagnosis not present

## 2018-02-09 DIAGNOSIS — J449 Chronic obstructive pulmonary disease, unspecified: Secondary | ICD-10-CM | POA: Diagnosis not present

## 2018-02-09 DIAGNOSIS — I25119 Atherosclerotic heart disease of native coronary artery with unspecified angina pectoris: Secondary | ICD-10-CM | POA: Diagnosis not present

## 2018-02-10 DIAGNOSIS — M545 Low back pain: Secondary | ICD-10-CM | POA: Diagnosis not present

## 2018-02-10 DIAGNOSIS — E1165 Type 2 diabetes mellitus with hyperglycemia: Secondary | ICD-10-CM | POA: Diagnosis not present

## 2018-02-10 DIAGNOSIS — J449 Chronic obstructive pulmonary disease, unspecified: Secondary | ICD-10-CM | POA: Diagnosis not present

## 2018-02-10 DIAGNOSIS — I25119 Atherosclerotic heart disease of native coronary artery with unspecified angina pectoris: Secondary | ICD-10-CM | POA: Diagnosis not present

## 2018-02-10 DIAGNOSIS — I10 Essential (primary) hypertension: Secondary | ICD-10-CM | POA: Diagnosis not present

## 2018-03-06 DIAGNOSIS — J449 Chronic obstructive pulmonary disease, unspecified: Secondary | ICD-10-CM | POA: Diagnosis not present

## 2018-04-15 NOTE — Congregational Nurse Program (Signed)
  Dept: [phone]   Congregational Nurse Program Note  Date of Encounter: 04/15/2018  Past Medical History: Past Medical History:  Diagnosis Date  . Anxiety   . ASCVD (arteriosclerotic cardiovascular disease)    H/o PCI  . Asthma   . Chronic back pain   . Chronic obstructive pulmonary disease (HCC)   . Degenerative joint disease    Right shoulder pain-mechanism unclear  . Depression   . Diabetes mellitus type II   . Gastroesophageal reflux disease    Diverticulosis, hemorrhoids with rectal bleeding,  . Glaucoma   . Glaucoma   . Hyperlipidemia   . Hypertension   . Irritable bowel syndrome    constipation alternating with diarrhea  . 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[employer], [employer];Medicare    Uninsured  Not Applicable    Food  Yes, have food insecurities    Housing/Utilities  Yes, have permanent housing   Has own home   Transportation  Yes, need transportation assistance   Family drives    Interpersonal Safety  Yes, feel physically and emotionally safe where you currently live    Medication  No medication insecurities    Medical Provider  Yes    Referrals  Not Applicable    ED Visit Averted  Not Applicable    Life-Saving Intervention Made  Not Applicable     04/15/2018  States she has hypertension  B/P 191/78  Pulse 77; Takes B/P meds as ordered by Dr.Hawkins. Hard of hearing , has hearing aid but does not wear often.  [place] [facility] [phone].

## 2018-05-14 DIAGNOSIS — M549 Dorsalgia, unspecified: Secondary | ICD-10-CM | POA: Diagnosis not present

## 2018-05-14 DIAGNOSIS — J449 Chronic obstructive pulmonary disease, unspecified: Secondary | ICD-10-CM | POA: Diagnosis not present

## 2018-05-14 DIAGNOSIS — I10 Essential (primary) hypertension: Secondary | ICD-10-CM | POA: Diagnosis not present

## 2018-06-30 DIAGNOSIS — E119 Type 2 diabetes mellitus without complications: Secondary | ICD-10-CM | POA: Diagnosis not present

## 2018-06-30 DIAGNOSIS — M545 Low back pain: Secondary | ICD-10-CM | POA: Diagnosis not present

## 2018-06-30 DIAGNOSIS — I2510 Atherosclerotic heart disease of native coronary artery without angina pectoris: Secondary | ICD-10-CM | POA: Diagnosis not present

## 2018-06-30 DIAGNOSIS — J441 Chronic obstructive pulmonary disease with (acute) exacerbation: Secondary | ICD-10-CM | POA: Diagnosis not present

## 2018-07-01 ENCOUNTER — Other Ambulatory Visit: Payer: Self-pay

## 2018-07-01 ENCOUNTER — Ambulatory Visit (INDEPENDENT_AMBULATORY_CARE_PROVIDER_SITE_OTHER): Payer: Medicare Other | Admitting: Nurse Practitioner

## 2018-07-01 ENCOUNTER — Encounter: Payer: Self-pay | Admitting: Nurse Practitioner

## 2018-07-01 VITALS — BP 151/70 | HR 73 | Temp 97.0°F | Ht 63.0 in | Wt 127.0 lb

## 2018-07-01 DIAGNOSIS — R1310 Dysphagia, unspecified: Secondary | ICD-10-CM | POA: Diagnosis not present

## 2018-07-01 DIAGNOSIS — R1319 Other dysphagia: Secondary | ICD-10-CM

## 2018-07-01 DIAGNOSIS — K219 Gastro-esophageal reflux disease without esophagitis: Secondary | ICD-10-CM

## 2018-07-01 MED ORDER — NEXIUM 40 MG PO CPDR: 40.0000 mg | DELAYED_RELEASE_CAPSULE | Freq: Every day | ORAL | 5 refills | Status: DC

## 2018-07-01 NOTE — Progress Notes (Signed)
Referring Provider: Kari BaarsHawkins, Edward, MD Primary Care Physician:  Kari BaarsHawkins, Edward, MD Primary GI:  Dr. Darrick PennaFields  Chief Complaint  Patient presents with   Gastroesophageal Reflux    f/u. PCP changed ppi and she did not tolerate it well. she stopped taking medication x 2 weeks ago   Dysphagia    no issue right now    HPI:   Sheila Myers is a 85 y.o. female who presents for follow-up on GERD and dysphasia.  Patient was last seen in our office 12/23/2017 for the same. Barium pill esophagram April 26 with age-related dysmotility, incomplete clearance of barium by primary peristaltic waves, intermittent retrograde peristalsis, no esophageal mass or stricture, tiny Zenker's diverticulum.  Previously did well on Nexium twice daily in 2017 but insurance stopped covering this.  EGD updated 08/19/2017 with benign-appearing esophageal stenosis status post dilation, mild gastritis due to aspirin.  Recommend continue current medications and attempt to get Nexium, follow-up in 4 months, consider esophageal manometry/pH probe if persistent dysphasia.  Her last visit she was taking Nexium twice daily and back on the generic which does not work as well.  Swallowing improved after EGD.  No other GI complaints.  Recommended continue current medications, refill PPI with name brand at next request, follow-up in 6 months.  Today she states she's doing ok overall. No further dysphagia since EGD/dilation. Still with GERD because PCP took her off a medication for her GERD ("a yellow pill") and hasn't had anything for GERD for a few weeks. She was taken off Nexium by PCP because she was taking two kinds. States Nexium worked best for her, specifically name brand (generic doesn't work as well). Denies abdominal pain, N/V, fever,c hills, unintentional weight loss, hematochezia, melena. Denies URI or flu-like symptoms. Denies loss of sense of taste or smell. Denies chest pain, dyspnea, dizziness, lightheadedness,  syncope, near syncope. Denies any other upper or lower GI symptoms.  Past Medical History:  Diagnosis Date   Anxiety    ASCVD (arteriosclerotic cardiovascular disease)    H/o PCI   Asthma    Chronic back pain    Chronic obstructive pulmonary disease (HCC)    Degenerative joint disease    Right shoulder pain-mechanism unclear   Depression    Diabetes mellitus type II    Gastroesophageal reflux disease    Diverticulosis, hemorrhoids with rectal bleeding,   Glaucoma    Glaucoma    Hyperlipidemia    Hypertension    Irritable bowel syndrome    constipation alternating with diarrhea   Stroke (HCC) 2017   TIA    Past Surgical History:  Procedure Laterality Date   ABDOMINAL HYSTERECTOMY     APPENDECTOMY     CHOLECYSTECTOMY     COLONOSCOPY WITH ESOPHAGOGASTRODUODENOSCOPY (EGD)  2007   Dr. Darrick PennaFields: diverticulosis, external hemorrhoids, hiatal hernia, patent Schatzki ring.   CORONARY ANGIOPLASTY WITH STENT PLACEMENT  1991   ESOPHAGOGASTRODUODENOSCOPY  2012   Dr. Darrick Pennafields: Peptic stricture versus ring status post dilation, gastric inflammation and polyps,  biopsies unremarkable.   ESOPHAGOGASTRODUODENOSCOPY (EGD) WITH PROPOFOL N/A 08/19/2017   Procedure: ESOPHAGOGASTRODUODENOSCOPY (EGD) WITH PROPOFOL;  Surgeon: West BaliFields, Sandi L, MD;  [facility];  Service: Endoscopy;  Laterality: N/A;  2:15pm   SAVORY DILATION N/A 08/19/2017   Procedure: SAVORY DILATION;  Surgeon: West BaliFields, Sandi L, MD;  Location: [facility];  Service: Endoscopy;  Laterality: N/A;    Current Outpatient Medications  Medication Sig Dispense Refill   albuterol (PROVENTIL HFA;VENTOLIN HFA)  108 (90 Base) MCG/ACT inhaler Inhale 2 puffs into the lungs every 6 (six) hours as needed for wheezing or shortness of breath.      alendronate (FOSAMAX) 70 MG tablet Takes 70mg  by mouth every 7 days on Mondays.  10   amLODipine (NORVASC) 10 MG tablet Take 10 mg by mouth daily.     aspirin EC 81 MG  tablet Take 81 mg by mouth daily.     bimatoprost (LUMIGAN) 0.03 % ophthalmic drops Place 1 drop into both eyes at bedtime.       HYDROcodone-acetaminophen (NORCO) 10-325 MG tablet Take 1 tablet by mouth every 6 (six) hours as needed for pain.  0   ipratropium-albuterol (DUONEB) 0.5-2.5 (3) MG/3ML SOLN Inhale 3 mLs into the lungs every 6 (six) hours as needed (wheezing or shortness of breath).      mometasone (ELOCON) 0.1 % cream Apply 1 application topically at bedtime. Applies to lower legs     Potassium Chloride ER 20 MEQ TBCR TAKE 2 TABLETS BY MOUTH TWICE DAILY FOR 3 DAYS 12 tablet 0   pravastatin (PRAVACHOL) 40 MG tablet Take 40 mg by mouth daily.  0   traZODone (DESYREL) 50 MG tablet Take 50 mg by mouth at bedtime.  5   No current facility-administered medications for this visit.     Allergies as of 07/01/2018 - Review Complete 07/01/2018  Allergen Reaction Noted   Peanut-containing drug products Anaphylaxis, Itching, and Rash 03/31/2008   Catapres [clonidine hcl] Other (See Comments) 09/16/2011   Codeine Other (See Comments) 05/10/2008   Morphine Other (See Comments) 05/10/2008    Family History  Problem Relation Age of Onset   Aneurysm Mother        Cerebral   Hypertension Mother    Breast cancer Sister    Breast cancer Sister    Colon cancer Neg Hx    Colon polyps Neg Hx     Social History   Socioeconomic History   Marital status: Widowed    Spouse name: Not on file   Number of children: Not on file   Years of education: Not on file   Highest education level: Not on file  Occupational History   Occupation: Retired Lawyer    Comment: [employer]  Social Needs   Financial resource strain: Not on file   Food insecurity:    Worry: Not on file    Inability: Not on file   Transportation needs:    Medical: Not on file    Non-medical: Not on file  Tobacco Use   Smoking status: Never Smoker   Smokeless tobacco: Never Used  Substance  and Sexual Activity   Alcohol use: No   Drug use: No   Sexual activity: Never  Lifestyle   Physical activity:    Days per week: Not on file    Minutes per session: Not on file   Stress: Not on file  Relationships   Social connections:    Talks on phone: Not on file    Gets together: Not on file    Attends religious service: Not on file    Active member of club or organization: Not on file    Attends meetings of clubs or organizations: Not on file    Relationship status: Not on file  Other Topics Concern   Not on file  Social History Narrative   Not on file    Review of Systems: Complete ROS negative except as per HPI.   Physical  Exam: BP (!) 151/70    Pulse 73    Temp (!) 97 F (36.1 C) (Oral)    Ht  (1.6 m)    Wt 127 lb (57.6 kg)    BMI 22.50 kg/m  General:   Alert and oriented. Pleasant and cooperative. Well-nourished and well-developed.  Eyes:  Without icterus, sclera clear and conjunctiva pink.  Ears:  Normal auditory acuity. Cardiovascular:  S1, S2 present without murmurs appreciated. Extremities without clubbing or edema. Respiratory:  Clear to auscultation bilaterally. No wheezes, rales, or rhonchi. No distress.  Gastrointestinal:  +BS, soft, non-tender and non-distended. No HSM noted. No guarding or rebound. No masses appreciated.  Rectal:  Deferred  Musculoskalatal:  Symmetrical without gross deformities. Neurologic:  Alert and oriented x4;  grossly normal neurologically. Psych:  Alert and cooperative. Normal mood and affect. Heme/Lymph/Immune: No excessive bruising noted.    07/01/2018 9:39 AM   Disclaimer: This note was dictated with voice recognition software. Similar sounding words can inadvertently be transcribed and may not be corrected upon review.

## 2018-07-01 NOTE — Assessment & Plan Note (Signed)
No further dysphasia.  Continue to monitor and follow-up as needed. 

## 2018-07-01 NOTE — Patient Instructions (Signed)
Your health issues we discussed today were:   GERD (reflux/heartburn): 1. I have sent a new prescription for the name brand Nexium 40 mg.  Take this once a day, before breakfast, on an empty stomach 2. Avoid triggers that tend to make your reflux/heartburn symptoms worse 3. Call us if you have any worsening or severe symptoms  Swallowing difficulties: 1. I am glad this has resolved for now 2. Call us if you have any worsening swallowing difficulties  Overall I recommend:  1. Continue your other current medications 2. Return for follow-up in 4 months 3. Call us if you have any questions or concerns.   Because of recent events of COVID-19 ("Coronavirus"), follow CDC recommendations:  1. Wash your hand frequently 2. Avoid touching your face 3. Stay away from people who are sick 4. If you have symptoms such as fever, cough, shortness of breath then call your healthcare provider for further guidance 5. If you are sick, STAY AT HOME unless otherwise directed by your healthcare provider. 6. Follow directions from state and national officials regarding staying safe   At [facility] we value your feedback. You may receive a survey about your visit today. Please share your experience as we strive to create trusting relationships with our patients to provide genuine, compassionate, quality care.  We appreciate your understanding and patience as we review any laboratory studies, imaging, and other diagnostic tests that are ordered as we care for you. Our office policy is 5 business days for review of these results, and any emergent or urgent results are addressed in a timely manner for your best interest. If you do not hear from our office in 1 week, please contact us.   We also encourage the use of MyChart, which contains your medical information for your review as well. If you are not enrolled in this feature, an access code is on this after visit summary for your convenience.  Thank you for allowing [place] to be involved in your care.  It was great to see you today!  I hope you have a great day!!

## 2018-07-01 NOTE — Assessment & Plan Note (Signed)
The patient is having recurrent GERD.  Apparently her PPI was stopped.  Based on conversation with her it seems like she is taken to PPIs and the intent was for her primary care to stop 1 of them however they both were stopped.  Namebrand Nexium has worked best for her, not as much relief from generic S omeprazole.  At this point we will restart namebrand Nexium 40 mg daily.  Recommend she avoid triggers.  Follow-up in 4 months.

## 2018-07-01 NOTE — Progress Notes (Signed)
cc'ed to pcp °

## 2018-07-06 ENCOUNTER — Telehealth: Payer: Self-pay | Admitting: Gastroenterology

## 2018-07-06 NOTE — Telephone Encounter (Signed)
PT is aware I am working on PA for the Brand name Nexium.

## 2018-07-06 NOTE — Telephone Encounter (Signed)
PATIENT CAME BY OFFICE AND STATED THE PRESCRIPTION WE SENT IN WAS TOO EXPENSIVE AND SHE WOULD LIKE TO TRY SOMETHING ELSE.

## 2018-07-07 ENCOUNTER — Telehealth: Payer: Self-pay

## 2018-07-07 NOTE — Telephone Encounter (Signed)
Opened in error

## 2018-07-07 NOTE — Telephone Encounter (Signed)
PA approved for the Nexium. Approval # [id number].  Faxed to [employer].

## 2018-07-21 ENCOUNTER — Telehealth: Payer: Self-pay

## 2018-07-21 NOTE — Progress Notes (Signed)
REVIEWED-NO ADDITIONAL RECOMMENDATIONS. 

## 2018-07-21 NOTE — Telephone Encounter (Signed)
Received a letter from [facility] asking to change pts Nexium 40 mg to the generic. I spoke with pt and she is ok with changing to the generic. EG are you ok with this? Please advise.

## 2018-07-24 NOTE — Telephone Encounter (Signed)
Yep, that's fine with me. Have the patient notify us if any problems on generic Nexium.

## 2018-07-27 NOTE — Telephone Encounter (Signed)
Faxed approval to change to generic Nexium. Ok per pt and EG.

## 2018-08-04 DIAGNOSIS — J449 Chronic obstructive pulmonary disease, unspecified: Secondary | ICD-10-CM | POA: Diagnosis not present

## 2018-10-01 DIAGNOSIS — M545 Low back pain: Secondary | ICD-10-CM | POA: Diagnosis not present

## 2018-10-01 DIAGNOSIS — J449 Chronic obstructive pulmonary disease, unspecified: Secondary | ICD-10-CM | POA: Diagnosis not present

## 2018-10-01 DIAGNOSIS — I10 Essential (primary) hypertension: Secondary | ICD-10-CM | POA: Diagnosis not present

## 2018-10-01 DIAGNOSIS — Z23 Encounter for immunization: Secondary | ICD-10-CM | POA: Diagnosis not present

## 2018-10-01 DIAGNOSIS — E119 Type 2 diabetes mellitus without complications: Secondary | ICD-10-CM | POA: Diagnosis not present

## 2018-10-14 NOTE — Congregational Nurse Program (Signed)
  Dept: [phone]   Congregational Nurse Program Note  Date of Encounter: 10/14/2018  Past Medical History: Past Medical History:  Diagnosis Date  . Anxiety   . ASCVD (arteriosclerotic cardiovascular disease)    H/o PCI  . Asthma   . Chronic back pain   . Chronic obstructive pulmonary disease ([place])   . Degenerative joint disease    Right shoulder pain-mechanism unclear  . Depression   . Diabetes mellitus type II   . Gastroesophageal reflux disease    Diverticulosis, hemorrhoids with rectal bleeding,  . Glaucoma   . Glaucoma   . Hyperlipidemia   . Hypertension   . Irritable bowel syndrome    constipation alternating with diarrhea  . 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 Sees Dr. Luan Pulling regularly.  Blood Pressure 144/73 pulse 80. Takes her blood pressure medication as directed. Voices no complaints.  Theron Arista, RN [phone].

## 2018-11-03 ENCOUNTER — Ambulatory Visit (INDEPENDENT_AMBULATORY_CARE_PROVIDER_SITE_OTHER): Payer: Medicare Other | Admitting: Nurse Practitioner

## 2018-11-03 ENCOUNTER — Other Ambulatory Visit: Payer: Self-pay

## 2018-11-03 ENCOUNTER — Encounter: Payer: Self-pay | Admitting: Nurse Practitioner

## 2018-11-03 VITALS — BP 151/82 | HR 83 | Temp 98.2°F | Ht 63.0 in | Wt 124.0 lb

## 2018-11-03 DIAGNOSIS — R1310 Dysphagia, unspecified: Secondary | ICD-10-CM | POA: Diagnosis not present

## 2018-11-03 DIAGNOSIS — K219 Gastro-esophageal reflux disease without esophagitis: Secondary | ICD-10-CM

## 2018-11-03 DIAGNOSIS — R1319 Other dysphagia: Secondary | ICD-10-CM

## 2018-11-03 MED ORDER — NEXIUM 40 MG PO CPDR: 40.0000 mg | DELAYED_RELEASE_CAPSULE | Freq: Every day | ORAL | 5 refills | Status: DC

## 2018-11-03 NOTE — Assessment & Plan Note (Signed)
Dysphagia symptoms resolved.  Recommend she continue to monitor and notify us of any recurrent symptoms.  Follow-up in 1 year otherwise.

## 2018-11-03 NOTE — Progress Notes (Signed)
Referring Provider: Sinda Du, MD Primary Care Physician:  Sinda Du, MD Primary GI:  Dr. Oneida Alar  Chief Complaint  Patient presents with  . Gastroesophageal Reflux    doing ok    HPI:   Sheila Myers is a 85 y.o. female who presents for follow-up on dysphagia.  The patient was last seen in our office 07/01/2018 for GERD and esophageal dysphagia. BPE on file with age-related dysmotility, incomplete clearance of barium by primary peristaltic waves, intermittent retrograde peristalsis, no esophageal mass or stricture, tiny Zenker's diverticulum. GERD previously did well with Nexium but insurance stopped coverage. EGD on file 08/19/2017 with benign-appearing esophageal stenosis status post dilation, mild gastritis due to aspirin.  We were able to reobtain Nexium twice daily for her but the generic does not work as well.  At her last visit doing well with no further dysphagia symptoms after dilation.  Still with some GERD he has primary care stopped her reflux medicine.  Nexium works best for her, specifically name brand (generic not as well).  No other GI complaints.  Recommended restart namebrand Nexium 40 mg daily, trigger avoidance, follow-up in 4 months.  Today she states she's doing well overall. She is on Protonix 40 mg bid for her GERD well. Thinks her PCP changed her to this at insurance request. Feels this is not working very well. Has GERD symptoms about 2-3 times a week and is associated with cough at time. breakthrough GERD symptoms. No symptoms on Nexium. Is wanting to switch back. PA on file for Nexium (see phone note 07/07/2018). Denies abdomina pain, N/V (other than nausea last night with significant throat mucus), hematochezia, melena, fever, chills, unintentional weight. Denies URI or flu-like symptoms. Denies loss of sense of taste or smell. Denies chest pain, dyspnea, dizziness, lightheadedness, syncope, near syncope. Denies any other upper or lower GI symptoms.   Past Medical History:  Diagnosis Date  . Anxiety   . ASCVD (arteriosclerotic cardiovascular disease)    H/o PCI  . Asthma   . Chronic back pain   . Chronic obstructive pulmonary disease ([place])   . Degenerative joint disease    Right shoulder pain-mechanism unclear  . Depression   . Diabetes mellitus type II   . Gastroesophageal reflux disease    Diverticulosis, hemorrhoids with rectal bleeding,  . Glaucoma   . Glaucoma   . Hyperlipidemia   . Hypertension   . Irritable bowel syndrome    constipation alternating with diarrhea  . Stroke [facility]) 2017   TIA    Past Surgical History:  Procedure Laterality Date  . ABDOMINAL HYSTERECTOMY    . APPENDECTOMY    . CHOLECYSTECTOMY    . COLONOSCOPY WITH ESOPHAGOGASTRODUODENOSCOPY (EGD)  2007   Dr. Oneida Alar: diverticulosis, external hemorrhoids, hiatal hernia, patent Schatzki ring.  . CORONARY ANGIOPLASTY WITH STENT PLACEMENT  1991  . ESOPHAGOGASTRODUODENOSCOPY  2012   Dr. Oneida Alar: Peptic stricture versus ring status post dilation, gastric inflammation and polyps,  biopsies unremarkable.  . ESOPHAGOGASTRODUODENOSCOPY (EGD) WITH PROPOFOL N/A 08/19/2017   Procedure: ESOPHAGOGASTRODUODENOSCOPY (EGD) WITH PROPOFOL;  Surgeon: Danie Binder, MD;  [facility];  Service: Endoscopy;  Laterality: N/A;  2:15pm  . SAVORY DILATION N/A 08/19/2017   Procedure: SAVORY DILATION;  Surgeon: Danie Binder, MD;  [facility];  Service: Endoscopy;  Laterality: N/A;    Current Outpatient Medications  Medication Sig Dispense Refill  . albuterol (PROVENTIL HFA;VENTOLIN HFA) 108 (90 Base) MCG/ACT inhaler Inhale 2 puffs into the  lungs every 6 (six) hours as needed for wheezing or shortness of breath.     Marland Kitchen. alendronate (FOSAMAX) 70 MG tablet Takes 70mg  by mouth every 7 days on Mondays.  10  . amLODipine-benazepril (LOTREL) 10-20 MG capsule Take 1 capsule by mouth daily.    Marland Kitchen. aspirin EC 81 MG tablet Take 81 mg by mouth every other day.     .  bimatoprost (LUMIGAN) 0.03 % ophthalmic drops Place 1 drop into both eyes at bedtime.      Marland Kitchen. HYDROcodone-acetaminophen (NORCO) 10-325 MG tablet Take 1 tablet by mouth every 6 (six) hours as needed for pain.  0  . ipratropium-albuterol (DUONEB) 0.5-2.5 (3) MG/3ML SOLN Inhale 3 mLs into the lungs every 6 (six) hours as needed (wheezing or shortness of breath).     . mometasone (ELOCON) 0.1 % cream Apply 1 application topically at bedtime. Applies to lower legs    . pantoprazole (PROTONIX) 40 MG tablet Take 1 tablet by mouth 2 (two) times daily.    . pravastatin (PRAVACHOL) 40 MG tablet Take 40 mg by mouth daily.  0  . traZODone (DESYREL) 50 MG tablet Take 50 mg by mouth at bedtime.  5  . NEXIUM 40 MG capsule Take 1 capsule (40 mg total) by mouth daily at 12 noon. (Patient not taking: Reported on 11/03/2018) 30 capsule 5   No current facility-administered medications for this visit.     Allergies as of 11/03/2018 - Review Complete 11/03/2018  Allergen Reaction Noted  . Peanut-containing drug products Anaphylaxis, Itching, and Rash 03/31/2008  . Catapres [clonidine hcl] Other (See Comments) 09/16/2011  . Codeine Other (See Comments) 05/10/2008  . Morphine Other (See Comments) 05/10/2008    Family History  Problem Relation Age of Onset  . Aneurysm Mother        Cerebral  . Hypertension Mother   . Breast cancer Sister   . Breast cancer Sister   . Colon cancer Neg Hx   . Colon polyps Neg Hx     Social History   Socioeconomic History  . Marital status: Widowed    Spouse name: Not on file  . Number of children: Not on file  . Years of education: Not on file  . Highest education level: Not on file  Occupational History  . Occupation: Retired LawyerCNA    Comment: [employer]  Social Needs  . Financial resource strain: Not on file  . Food insecurity    Worry: Not on file    Inability: Not on file  . Transportation needs    Medical: Not on file    Non-medical: Not on file   Tobacco Use  . Smoking status: Never Smoker  . Smokeless tobacco: Never Used  Substance and Sexual Activity  . Alcohol use: No  . Drug use: No  . Sexual activity: Never  Lifestyle  . Physical activity    Days per week: Not on file    Minutes per session: Not on file  . Stress: Not on file  Relationships  . Social Musicianconnections    Talks on phone: Not on file    Gets together: Not on file    Attends religious service: Not on file    Active member of club or organization: Not on file    Attends meetings of clubs or organizations: Not on file    Relationship status: Not on file  Other Topics Concern  . Not on file  Social History Narrative  . Not on  file    Review of Systems: General: Negative for anorexia, weight loss, fever, chills, fatigue, weakness. ENT: Negative for hoarseness, difficulty swallowing. CV: Negative for chest pain, angina, palpitations, peripheral edema.  Respiratory: Negative for dyspnea at rest, cough, sputum, wheezing.  GI: See history of present illness. Endo: Negative for unusual weight change.  Heme: Negative for bruising or bleeding. Allergy: Negative for rash or hives.   Physical Exam: BP (!) 151/82   Pulse 83   Temp 98.2 F (36.8 C) (Temporal)   Ht 5\' 3"  (1.6 m)   Wt 124 lb (56.2 kg)   BMI 21.97 kg/m  General:   Alert and oriented. Pleasant and cooperative. Well-nourished and well-developed.  Eyes:  Without icterus, sclera clear and conjunctiva pink.  Ears:  Normal auditory acuity. Cardiovascular:  S1, S2 present without murmurs appreciated. Extremities without clubbing or edema. Respiratory:  Clear to auscultation bilaterally. No wheezes, rales, or rhonchi. No distress.  Gastrointestinal:  +BS, soft, non-tender and non-distended. No HSM noted. No guarding or rebound. No masses appreciated.  Rectal:  Deferred  Musculoskalatal:  Symmetrical without gross deformities. Skin:  Intact without significant lesions or rashes. Neurologic:  Alert  and oriented x4;  grossly normal neurologically. Psych:  Alert and cooperative. Normal mood and affect. Heme/Lymph/Immune: No excessive bruising noted.    11/03/2018 11:34 AM   Disclaimer: This note was dictated with voice recognition software. Similar sounding words can inadvertently be transcribed and may not be corrected upon review.

## 2018-11-03 NOTE — Patient Instructions (Signed)
Your health issues we discussed today were:   GERD (reflux/heartburn) with previous swallowing difficulties: 1. I have sent a new prescription to your pharmacy for Nexium 40 mg once daily 2. Stop taking Protonix 3. If anybody tries to change your acid blocker from Nexium to something else, have them call our office first 4. Call us if you have any worsening or severe symptoms 5. Call us if your swallowing problems return  Overall I recommend:  1. Continue your other current medications 2. Return for follow-up in 1 year 3. Call us if you have any questions or concerns.   Because of recent events of COVID-19 ("Coronavirus"), follow CDC recommendations:  Wash your hand frequently Avoid touching your face Stay away from people who are sick If you have symptoms such as fever, cough, shortness of breath then call your healthcare provider for further guidance If you are sick, STAY AT HOME unless otherwise directed by your healthcare provider. Follow directions from state and national officials regarding staying safe   At [facility] we value your feedback. You may receive a survey about your visit today. Please share your experience as we strive to create trusting relationships with our patients to provide genuine, compassionate, quality care.  We appreciate your understanding and patience as we review any laboratory studies, imaging, and other diagnostic tests that are ordered as we care for you. Our office policy is 5 business days for review of these results, and any emergent or urgent results are addressed in a timely manner for your best interest. If you do not hear from our office in 1 week, please contact us.   We also encourage the use of MyChart, which contains your medical information for your review as well. If you are not enrolled in this feature, an access code is on this after visit summary for your convenience. Thank you for allowing [place] to be involved in your  care.  It was great to see you today!  I hope you have a great Fall!!

## 2018-11-03 NOTE — Assessment & Plan Note (Signed)
Worsening GERD symptoms.  It appears somebody keeps taking her off of Nexium, which is the medication that typically works for her, and starting her on another PPI such as Prilosec or Protonix.  These do not work well for her.  She is having breakthrough symptoms at least 2-3 times a week with esophageal burning and associated cough.  We will again refill her namebrand Nexium based on what we have established works well for her.  Stop her tonics.  I have asked her to inform any providers that attempt to change her acid blocker to call us first.  Follow-up in 1 year.  Prior authorization on file for Nexium.

## 2018-11-16 DIAGNOSIS — J449 Chronic obstructive pulmonary disease, unspecified: Secondary | ICD-10-CM | POA: Diagnosis not present

## 2018-12-01 ENCOUNTER — Other Ambulatory Visit: Payer: Self-pay | Admitting: Pulmonary Disease

## 2018-12-01 ENCOUNTER — Telehealth: Payer: Self-pay | Admitting: *Deleted

## 2018-12-01 ENCOUNTER — Other Ambulatory Visit (HOSPITAL_COMMUNITY): Payer: Self-pay | Admitting: Pulmonary Disease

## 2018-12-01 DIAGNOSIS — R1314 Dysphagia, pharyngoesophageal phase: Secondary | ICD-10-CM | POA: Diagnosis not present

## 2018-12-01 DIAGNOSIS — I25119 Atherosclerotic heart disease of native coronary artery with unspecified angina pectoris: Secondary | ICD-10-CM | POA: Diagnosis not present

## 2018-12-01 DIAGNOSIS — E1165 Type 2 diabetes mellitus with hyperglycemia: Secondary | ICD-10-CM | POA: Diagnosis not present

## 2018-12-01 DIAGNOSIS — I10 Essential (primary) hypertension: Secondary | ICD-10-CM | POA: Diagnosis not present

## 2018-12-01 DIAGNOSIS — J449 Chronic obstructive pulmonary disease, unspecified: Secondary | ICD-10-CM | POA: Diagnosis not present

## 2018-12-01 DIAGNOSIS — Z23 Encounter for immunization: Secondary | ICD-10-CM | POA: Diagnosis not present

## 2018-12-01 DIAGNOSIS — Z0000 Encounter for general adult medical examination without abnormal findings: Secondary | ICD-10-CM | POA: Diagnosis not present

## 2018-12-01 DIAGNOSIS — R0989 Other specified symptoms and signs involving the circulatory and respiratory systems: Secondary | ICD-10-CM

## 2018-12-03 ENCOUNTER — Other Ambulatory Visit (HOSPITAL_COMMUNITY): Payer: Self-pay | Admitting: Pulmonary Disease

## 2018-12-03 ENCOUNTER — Telehealth: Payer: Self-pay | Admitting: *Deleted

## 2018-12-03 DIAGNOSIS — M810 Age-related osteoporosis without current pathological fracture: Secondary | ICD-10-CM

## 2018-12-14 ENCOUNTER — Inpatient Hospital Stay (HOSPITAL_COMMUNITY): Admission: RE | Admit: 2018-12-14 | Payer: Medicare Other | Source: Ambulatory Visit

## 2018-12-16 ENCOUNTER — Other Ambulatory Visit: Payer: Self-pay

## 2018-12-16 ENCOUNTER — Ambulatory Visit (HOSPITAL_COMMUNITY)
Admission: RE | Admit: 2018-12-16 | Discharge: 2018-12-16 | Disposition: A | Discharge status: Home / Self Care | Payer: Medicare Other | Source: Ambulatory Visit | Attending: Pulmonary Disease | Admitting: Pulmonary Disease

## 2018-12-16 DIAGNOSIS — M130 Polyarthritis, unspecified: Secondary | ICD-10-CM | POA: Diagnosis not present

## 2018-12-16 DIAGNOSIS — G894 Chronic pain syndrome: Secondary | ICD-10-CM | POA: Diagnosis not present

## 2018-12-16 DIAGNOSIS — M810 Age-related osteoporosis without current pathological fracture: Secondary | ICD-10-CM

## 2018-12-16 DIAGNOSIS — M818 Other osteoporosis without current pathological fracture: Secondary | ICD-10-CM | POA: Diagnosis not present

## 2018-12-16 DIAGNOSIS — M25519 Pain in unspecified shoulder: Secondary | ICD-10-CM | POA: Diagnosis not present

## 2018-12-16 DIAGNOSIS — G8929 Other chronic pain: Secondary | ICD-10-CM | POA: Diagnosis not present

## 2018-12-16 DIAGNOSIS — M545 Low back pain: Secondary | ICD-10-CM | POA: Diagnosis not present

## 2018-12-17 DIAGNOSIS — J449 Chronic obstructive pulmonary disease, unspecified: Secondary | ICD-10-CM | POA: Diagnosis not present

## 2019-01-16 DIAGNOSIS — J449 Chronic obstructive pulmonary disease, unspecified: Secondary | ICD-10-CM | POA: Diagnosis not present

## 2019-02-10 DIAGNOSIS — M545 Low back pain: Secondary | ICD-10-CM | POA: Diagnosis not present

## 2019-02-16 DIAGNOSIS — J449 Chronic obstructive pulmonary disease, unspecified: Secondary | ICD-10-CM | POA: Diagnosis not present

## 2019-03-02 ENCOUNTER — Ambulatory Visit (INDEPENDENT_AMBULATORY_CARE_PROVIDER_SITE_OTHER): Payer: Medicare Other

## 2019-03-02 ENCOUNTER — Ambulatory Visit
Admission: EM | Admit: 2019-03-02 | Discharge: 2019-03-02 | Disposition: A | Discharge status: Home / Self Care | Payer: Medicare Other | Attending: Family Medicine | Admitting: Family Medicine

## 2019-03-02 ENCOUNTER — Other Ambulatory Visit: Payer: Self-pay

## 2019-03-02 DIAGNOSIS — R5383 Other fatigue: Secondary | ICD-10-CM

## 2019-03-02 DIAGNOSIS — R0602 Shortness of breath: Secondary | ICD-10-CM

## 2019-03-02 DIAGNOSIS — Z7689 Persons encountering health services in other specified circumstances: Secondary | ICD-10-CM | POA: Diagnosis not present

## 2019-03-02 DIAGNOSIS — R630 Anorexia: Secondary | ICD-10-CM

## 2019-03-02 MED ORDER — PREDNISONE 10 MG (21) PO TBPK: ORAL_TABLET | Freq: Every day | ORAL | 0 refills | Status: AC

## 2019-03-02 NOTE — ED Provider Notes (Signed)
[facility]    CSN: [id number] Arrival date & time: 03/02/19  1117      History   Chief Complaint Chief Complaint  Patient presents with  . Asthma    HPI Sheila Myers is a 86 y.o. female.   Reports that she has asthma and that she has had a flare up for the last week. Reports that she is using her rescue inhaler 4-5 times per day, denies nebulizer use, states that she does not have a machine. Denies cough, fever, HA, chills, myalgias, rash. Reports fatigue and decreased appetite today. Reports that she is still drinking well and voiding normally.  The history is provided by the patient.    Past Medical History:  Diagnosis Date  . Anxiety   . ASCVD (arteriosclerotic cardiovascular disease)    H/o PCI  . Asthma   . Chronic back pain   . Chronic obstructive pulmonary disease (HCC)   . Degenerative joint disease    Right shoulder pain-mechanism unclear  . Depression   . Diabetes mellitus type II   . Gastroesophageal reflux disease    Diverticulosis, hemorrhoids with rectal bleeding,  . Glaucoma   . Glaucoma   . Hyperlipidemia   . Hypertension   . Irritable bowel syndrome    constipation alternating with diarrhea  . Stroke [facility]) 2017   TIA    Patient Active Problem List   Diagnosis Date Noted  . Esophageal dysphagia 06/19/2017  . Closed compression fracture of L1 lumbar vertebra, with routine healing, subsequent encounter 07/03/2016  . Chest pain 12/07/2015  . Chest pain at rest 12/07/2015  . TIA (transient ischemic attack) 08/04/2015  . Slurred speech 08/04/2015  . Accelerated hypertension 08/04/2015  . Right arm pain 08/04/2015  . Cerebrovascular disease 09/28/2011  . Thyroid nodule 09/28/2011  . Irritable bowel syndrome   . Gastroesophageal reflux disease   . Arteriosclerotic cardiovascular disease (ASCVD)   . Diabetes mellitus, type II (HCC)   . Hypertension   . Hyperlipidemia   . Pulmonary emphysema (HCC) 05/10/2008  . BACK PAIN,  CHRONIC 05/10/2008  . ABDOMINAL PAIN-EPIGASTRIC 03/31/2008    Past Surgical History:  Procedure Laterality Date  . ABDOMINAL HYSTERECTOMY    . APPENDECTOMY    . CHOLECYSTECTOMY    . COLONOSCOPY WITH ESOPHAGOGASTRODUODENOSCOPY (EGD)  2007   Dr. Darrick Penna: diverticulosis, external hemorrhoids, hiatal hernia, patent Schatzki ring.  . CORONARY ANGIOPLASTY WITH STENT PLACEMENT  1991  . ESOPHAGOGASTRODUODENOSCOPY  2012   Dr. Darrick Penna: Peptic stricture versus ring status post dilation, gastric inflammation and polyps,  biopsies unremarkable.  . ESOPHAGOGASTRODUODENOSCOPY (EGD) WITH PROPOFOL N/A 08/19/2017   Procedure: ESOPHAGOGASTRODUODENOSCOPY (EGD) WITH PROPOFOL;  Surgeon: West Bali, MD;  [facility];  Service: Endoscopy;  Laterality: N/A;  2:15pm  . SAVORY DILATION N/A 08/19/2017   Procedure: SAVORY DILATION;  Surgeon: West Bali, MD;  Location: [facility];  Service: Endoscopy;  Laterality: N/A;    OB History   No obstetric history on file.      Home Medications    Prior to Admission medications   Medication Sig Start Date End Date Taking? Authorizing Provider  HYDROcodone-acetaminophen (NORCO) 10-325 MG tablet Take 1 tablet by mouth every 6 (six) hours as needed for pain. 07/30/17  Yes [provider]  albuterol (PROVENTIL HFA;VENTOLIN HFA) 108 (90 Base) MCG/ACT inhaler Inhale 2 puffs into the lungs every 6 (six) hours as needed for wheezing or shortness of breath.     [provider]  alendronate (FOSAMAX) 70 MG tablet Takes 70mg  by mouth every 7 days on Mondays. 08/15/16   [provider]  amLODipine-benazepril (LOTREL) 10-20 MG capsule Take 1 capsule by mouth daily. 10/14/18   [provider]  aspirin EC 81 MG tablet Take 81 mg by mouth every other day.     [provider]  bimatoprost (LUMIGAN) 0.03 % ophthalmic drops Place 1 drop into both eyes at bedtime.      [provider]  ipratropium-albuterol (DUONEB)  0.5-2.5 (3) MG/3ML SOLN Inhale 3 mLs into the lungs every 6 (six) hours as needed (wheezing or shortness of breath).  07/31/15   [provider]  mometasone (ELOCON) 0.1 % cream Apply 1 application topically at bedtime. Applies to lower legs    [provider]  NEXIUM 40 MG capsule Take 1 capsule (40 mg total) by mouth daily at 12 noon. 11/03/18   01/03/19, NP  pantoprazole (PROTONIX) 40 MG tablet Take 1 tablet by mouth 2 (two) times daily. 09/14/18   [provider]  pravastatin (PRAVACHOL) 40 MG tablet Take 40 mg by mouth daily. 06/30/15   [provider]  predniSONE (STERAPRED UNI-PAK 21 TAB) 10 MG (21) TBPK tablet Take by mouth daily for 6 days. Take 6 tablets on day 1, 5 tablets on day 2, 4 tablets on day 3, 3 tablets on day 4, 2 tablets on day 5, 1 tablet on day 6 03/02/19 03/08/19  05/06/19, NP  traZODone (DESYREL) 50 MG tablet Take 50 mg by mouth at bedtime. 08/04/17   [provider]    Family History Family History  Problem Relation Age of Onset  . Aneurysm Mother        Cerebral  . Hypertension Mother   . Breast cancer Sister   . Breast cancer Sister   . Colon cancer Neg Hx   . Colon polyps Neg Hx     Social History Social History   Tobacco Use  . Smoking status: Never Smoker  . Smokeless tobacco: Never Used  Substance Use Topics  . Alcohol use: No  . Drug use: No     Allergies   Peanut-containing drug products, Catapres [clonidine hcl], Codeine, and Morphine   Review of Systems Review of Systems  Constitutional: Positive for activity change and fatigue. Negative for chills and fever.  HENT: Negative for ear pain, sinus pressure, sinus pain and sore throat.   Eyes: Negative for pain and visual disturbance.  Respiratory: Positive for chest tightness and wheezing. Negative for cough.   Cardiovascular: Negative for chest pain and palpitations.  Gastrointestinal: Negative for abdominal pain, diarrhea, nausea and  vomiting.  Genitourinary: Negative for dysuria and hematuria.  Musculoskeletal: Negative for arthralgias and back pain.  Skin: Negative for color change and rash.  Neurological: Negative for seizures, syncope and weakness.  All other systems reviewed and are negative.    Physical Exam Triage Vital Signs ED Triage Vitals  Enc Vitals Group     BP 03/02/19 1136 132/71     Pulse Rate 03/02/19 1136 81     Resp 03/02/19 1136 20     Temp 03/02/19 1136 98.2 F (36.8 C)     Temp src --      SpO2 03/02/19 1136 94 %     Weight --      Height --      Head Circumference --      Peak Flow --      Pain Score 03/02/19  1132 5     Pain Loc --      Pain Edu? --      Excl. in [place]? --    No data found.  Updated Vital Signs BP 132/71   Pulse 81   Temp 98.2 F (36.8 C)   Resp 20   Wt 119 lb 3.2 oz (54.1 kg)   SpO2 94%   BMI 21.12 kg/m      Physical Exam Vitals and nursing note reviewed.  Constitutional:      General: She is not in acute distress.    Appearance: She is well-developed.  HENT:     Head: Normocephalic and atraumatic.     Right Ear: Tympanic membrane normal.     Left Ear: Tympanic membrane normal.     Nose: Nose normal. No congestion or rhinorrhea.     Mouth/Throat:     Mouth: Mucous membranes are moist.     Pharynx: No oropharyngeal exudate or posterior oropharyngeal erythema.  Eyes:     Conjunctiva/sclera: Conjunctivae normal.     Pupils: Pupils are equal, round, and reactive to light.  Cardiovascular:     Rate and Rhythm: Normal rate and regular rhythm.     Pulses: Normal pulses.     Heart sounds: Normal heart sounds. No murmur.  Pulmonary:     Effort: Pulmonary effort is normal. No respiratory distress.     Breath sounds: Examination of the right-middle field reveals decreased breath sounds. Examination of the left-middle field reveals decreased breath sounds. Examination of the right-lower field reveals decreased breath sounds. Examination of the left-lower  field reveals decreased breath sounds. Decreased breath sounds present.  Abdominal:     General: Abdomen is flat.     Palpations: Abdomen is soft.     Tenderness: There is no abdominal tenderness.  Musculoskeletal:        General: Normal range of motion.     Cervical back: Neck supple.  Skin:    General: Skin is warm and dry.  Neurological:     General: No focal deficit present.     Mental Status: She is alert and oriented to person, place, and time.  Psychiatric:        Mood and Affect: Mood normal.        Behavior: Behavior normal.      UC Treatments / Results  Labs (all labs ordered are listed, but only abnormal results are displayed) Labs Reviewed  NOVEL CORONAVIRUS, NAA    EKG   Radiology DG Chest 2 View  Result Date: 03/02/2019 CLINICAL DATA:  Increased sob Hx of asthma Inhaler is not giving reliefthe EXAM: CHEST - 2 VIEW COMPARISON:  Radiograph 04/16/2016 FINDINGS: Normal cardiac silhouette. There is a patchy focus of airspace disease in the RIGHT upper lobe as well as the LEFT midlung. No pleural fluid. No pneumothorax. IMPRESSION: Bilateral patchy mild airspace disease suggest early multifocal pneumonia. Recommend correlation for early viral pneumonia. Electronically Signed   By: Genevive Bi M.D.   On: 03/02/2019 12:01    Procedures Procedures (including critical care time)  Medications Ordered in UC Medications - No data to display  Initial Impression / Assessment and Plan / UC Course  I have reviewed the triage vital signs and the nursing notes.  Pertinent labs & imaging results that were available during my care of the patient were reviewed by me and considered in my medical decision making (see chart for details).     Presents with SOB, fatigue, and  loss of appetite today. O2 sat 94% today, no previous to compare. Has lost 5#since last recorded weight 3 mos ago. Chest xray in office today showing likely viral pneumonia. Predpack prescribed. Recommend  follow up chest xray in 6 weeks. Instructed on when to go to the ED. Send out Covid test is pending. Will call with results. Instructed to quarantine until results are final. Final Clinical Impressions(s) / UC Diagnoses   Final diagnoses:  Shortness of breath  Decreased appetite  Fatigue, unspecified type     Discharge Instructions     You have been experiencing shortness of breath. I will prescribe steroids for you. Take as directed on the package. We did a chest xray today and it shows that you have viral pneumonia. I recommend another chest xray to recheck in 6 weeks.   Your COVID test is pending.  You should self quarantine until your test result is back and is negative.    Take Tylenol as needed for fever or discomfort.  Rest and keep yourself hydrated.    Go to the emergency department if you develop high fever, shortness of breath, severe diarrhea, or other concerning symptoms.       ED Prescriptions    Medication Sig Dispense Auth. Provider   predniSONE (STERAPRED UNI-PAK 21 TAB) 10 MG (21) TBPK tablet Take by mouth daily for 6 days. Take 6 tablets on day 1, 5 tablets on day 2, 4 tablets on day 3, 3 tablets on day 4, 2 tablets on day 5, 1 tablet on day 6 21 tablet Faustino Congress, NP     I have reviewed the PDMP during this encounter.   Faustino Congress, NP 03/02/19 1223

## 2019-03-02 NOTE — ED Triage Notes (Signed)
Pt states that's she had asthma flare last week and still feels bad, states that she is using inhaler more than usual and its making her feel jittery, also reports poor appetite and weakness

## 2019-03-02 NOTE — Discharge Instructions (Addendum)
You have been experiencing shortness of breath. I will prescribe steroids for you. Take as directed on the package. We did a chest xray today and it shows that you have viral pneumonia. I recommend another chest xray to recheck in 6 weeks.   Your COVID test is pending.  You should self quarantine until your test result is back and is negative.    Take Tylenol as needed for fever or discomfort.  Rest and keep yourself hydrated.    Go to the emergency department if you develop high fever, shortness of breath, severe diarrhea, or other concerning symptoms.

## 2019-03-03 LAB — NOVEL CORONAVIRUS, NAA: SARS-CoV-2, NAA: NOT DETECTED

## 2019-03-19 DIAGNOSIS — J449 Chronic obstructive pulmonary disease, unspecified: Secondary | ICD-10-CM | POA: Diagnosis not present

## 2019-03-31 ENCOUNTER — Ambulatory Visit (INDEPENDENT_AMBULATORY_CARE_PROVIDER_SITE_OTHER): Payer: Medicare Other

## 2019-03-31 ENCOUNTER — Ambulatory Visit
Admission: EM | Admit: 2019-03-31 | Discharge: 2019-03-31 | Disposition: A | Discharge status: Home / Self Care | Payer: Medicare Other | Attending: Emergency Medicine | Admitting: Emergency Medicine

## 2019-03-31 ENCOUNTER — Other Ambulatory Visit: Payer: Self-pay

## 2019-03-31 DIAGNOSIS — L239 Allergic contact dermatitis, unspecified cause: Secondary | ICD-10-CM | POA: Diagnosis not present

## 2019-03-31 DIAGNOSIS — J189 Pneumonia, unspecified organism: Secondary | ICD-10-CM

## 2019-03-31 DIAGNOSIS — B888 Other specified infestations: Secondary | ICD-10-CM

## 2019-03-31 DIAGNOSIS — R531 Weakness: Secondary | ICD-10-CM | POA: Diagnosis not present

## 2019-03-31 DIAGNOSIS — R0602 Shortness of breath: Secondary | ICD-10-CM

## 2019-03-31 MED ORDER — METHYLPREDNISOLONE SODIUM SUCC 125 MG IJ SOLR
60.0000 mg | Freq: Once | INTRAMUSCULAR | Status: AC
  Administered 2019-03-31: 60 mg via INTRAMUSCULAR

## 2019-03-31 MED ORDER — PREDNISONE 10 MG PO TABS: 20.0000 mg | ORAL_TABLET | Freq: Every day | ORAL | 0 refills | Status: DC

## 2019-03-31 MED ORDER — ALBUTEROL SULFATE HFA 108 (90 BASE) MCG/ACT IN AERS: 1.0000 | INHALATION_SPRAY | Freq: Four times a day (QID) | RESPIRATORY_TRACT | 0 refills | Status: DC | PRN

## 2019-03-31 MED ORDER — LORATADINE 10 MG PO TABS: 10.0000 mg | ORAL_TABLET | Freq: Every day | ORAL | 0 refills | Status: DC

## 2019-03-31 NOTE — ED Provider Notes (Signed)
[facility]    CSN: [id number] Arrival date & time: 03/31/19  1250      History   Chief Complaint Chief Complaint  Patient presents with  . Weakness  . Shortness of Breath    HPI Sheila Myers is a 86 y.o. female.   Who presented to urgent care for complaint of shortness of breath, rash to right side of neck and shoulder for the past few days.  Describes the rash as red and itchy.  Denies any precipitating event. Report she was diagnosed with a viral pneumonia was prescribed prednisone on 03/02/2019. She has recently received her first dose of COVID-19 immunization. Reports similar symptoms in the past and was prescribed prednisone taper with Solu-Medrol IM with symptoms relief. Denies chills,  fever, nausea, vomiting, diarrhea, chest pain, chest tightness.  A live bed bug was find on patient during examination.     Past Medical History:  Diagnosis Date  . Anxiety   . ASCVD (arteriosclerotic cardiovascular disease)    H/o PCI  . Asthma   . Chronic back pain   . Chronic obstructive pulmonary disease (HCC)   . Degenerative joint disease    Right shoulder pain-mechanism unclear  . Depression   . Diabetes mellitus type II   . Gastroesophageal reflux disease    Diverticulosis, hemorrhoids with rectal bleeding,  . Glaucoma   . Glaucoma   . Hyperlipidemia   . Hypertension   . Irritable bowel syndrome    constipation alternating with diarrhea  . Stroke [facility]) 2017   TIA    Patient Active Problem List   Diagnosis Date Noted  . Esophageal dysphagia 06/19/2017  . Closed compression fracture of L1 lumbar vertebra, with routine healing, subsequent encounter 07/03/2016  . Chest pain 12/07/2015  . Chest pain at rest 12/07/2015  . TIA (transient ischemic attack) 08/04/2015  . Slurred speech 08/04/2015  . Accelerated hypertension 08/04/2015  . Right arm pain 08/04/2015  . Cerebrovascular disease 09/28/2011  . Thyroid nodule 09/28/2011  . Irritable bowel  syndrome   . Gastroesophageal reflux disease   . Arteriosclerotic cardiovascular disease (ASCVD)   . Diabetes mellitus, type II (HCC)   . Hypertension   . Hyperlipidemia   . Pulmonary emphysema (HCC) 05/10/2008  . BACK PAIN, CHRONIC 05/10/2008  . ABDOMINAL PAIN-EPIGASTRIC 03/31/2008    Past Surgical History:  Procedure Laterality Date  . ABDOMINAL HYSTERECTOMY    . APPENDECTOMY    . CHOLECYSTECTOMY    . COLONOSCOPY WITH ESOPHAGOGASTRODUODENOSCOPY (EGD)  2007   Dr. Darrick Penna: diverticulosis, external hemorrhoids, hiatal hernia, patent Schatzki ring.  . CORONARY ANGIOPLASTY WITH STENT PLACEMENT  1991  . ESOPHAGOGASTRODUODENOSCOPY  2012   Dr. Darrick Penna: Peptic stricture versus ring status post dilation, gastric inflammation and polyps,  biopsies unremarkable.  . ESOPHAGOGASTRODUODENOSCOPY (EGD) WITH PROPOFOL N/A 08/19/2017   Procedure: ESOPHAGOGASTRODUODENOSCOPY (EGD) WITH PROPOFOL;  Surgeon: West Bali, MD;  [facility];  Service: Endoscopy;  Laterality: N/A;  2:15pm  . SAVORY DILATION N/A 08/19/2017   Procedure: SAVORY DILATION;  Surgeon: West Bali, MD;  [facility];  Service: Endoscopy;  Laterality: N/A;    OB History   No obstetric history on file.      Home Medications    Prior to Admission medications   Medication Sig Start Date End Date Taking? Authorizing Provider  alendronate (FOSAMAX) 70 MG tablet Takes 70mg  by mouth every 7 days on Mondays. 08/15/16  Yes [provider]  amLODipine-benazepril (LOTREL) 10-20 MG  capsule Take 1 capsule by mouth daily. 10/14/18  Yes [provider]  aspirin EC 81 MG tablet Take 81 mg by mouth every other day.    Yes [provider]  bimatoprost (LUMIGAN) 0.03 % ophthalmic drops Place 1 drop into both eyes at bedtime.     Yes [provider]  HYDROcodone-acetaminophen (NORCO) 10-325 MG tablet Take 1 tablet by mouth every 6 (six) hours as needed for pain. 07/30/17  Yes [provider]  NEXIUM 40 MG capsule Take 1 capsule (40 mg total) by mouth daily at 12 noon. 11/03/18  Yes Carlis Stable, NP  albuterol (VENTOLIN HFA) 108 (90 Base) MCG/ACT inhaler Inhale 1-2 puffs into the lungs every 6 (six) hours as needed for wheezing or shortness of breath. 03/31/19   Ksean Vale, Darrelyn Hillock, FNP  ipratropium-albuterol (DUONEB) 0.5-2.5 (3) MG/3ML SOLN Inhale 3 mLs into the lungs every 6 (six) hours as needed (wheezing or shortness of breath).  07/31/15   [provider]  loratadine (CLARITIN) 10 MG tablet Take 1 tablet (10 mg total) by mouth daily. 03/31/19   Pranay Hilbun, Darrelyn Hillock, FNP  mometasone (ELOCON) 0.1 % cream Apply 1 application topically at bedtime. Applies to lower legs    [provider]  pantoprazole (PROTONIX) 40 MG tablet Take 1 tablet by mouth 2 (two) times daily. 09/14/18   [provider]  pravastatin (PRAVACHOL) 40 MG tablet Take 40 mg by mouth daily. 06/30/15   [provider]  predniSONE (DELTASONE) 10 MG tablet Take 2 tablets (20 mg total) by mouth daily. 03/31/19   Harlow Carrizales, Darrelyn Hillock, FNP  traZODone (DESYREL) 50 MG tablet Take 50 mg by mouth at bedtime. 08/04/17   [provider]    Family History Family History  Problem Relation Age of Onset  . Aneurysm Mother        Cerebral  . Hypertension Mother   . Breast cancer Sister   . Breast cancer Sister   . Colon cancer Neg Hx   . Colon polyps Neg Hx     Social History Social History   Tobacco Use  . Smoking status: Never Smoker  . Smokeless tobacco: Never Used  Substance Use Topics  . Alcohol use: No  . Drug use: No     Allergies   Peanut-containing drug products, Catapres [clonidine hcl], Codeine, and Morphine   Review of Systems Review of Systems  Constitutional: Positive for fatigue.  HENT: Negative.   Respiratory: Positive for shortness of breath.   Cardiovascular: Negative.   All other systems reviewed and are negative.    Physical Exam Triage Vital  Signs ED Triage Vitals  Enc Vitals Group     BP 03/31/19 1305 (!) 141/83     Pulse Rate 03/31/19 1305 90     Resp 03/31/19 1305 18     Temp 03/31/19 1305 98.3 F (36.8 C)     Temp Source 03/31/19 1305 Oral     SpO2 03/31/19 1304 96 %     Weight 03/31/19 1304 120 lb (54.4 kg)     Height 03/31/19 1304 5\' 3"  (1.6 m)     Head Circumference --      Peak Flow --      Pain Score 03/31/19 1303 8     Pain Loc --      Pain Edu? --      Excl. in [place]? --    No data found.  Updated Vital Signs BP (!) 141/83 (BP Location: Right  Arm)   Pulse 90   Temp 98.3 F (36.8 C) (Oral)   Resp 18   Ht 5\' 3"  (1.6 m)   Wt 120 lb (54.4 kg)   SpO2 96%   BMI 21.26 kg/m   Visual Acuity Right Eye Distance:   Left Eye Distance:   Bilateral Distance:    Right Eye Near:   Left Eye Near:    Bilateral Near:     Physical Exam Vitals and nursing note reviewed.  Constitutional:      General: She is not in acute distress.    Appearance: Normal appearance. She is normal weight. She is not ill-appearing, toxic-appearing or diaphoretic.  HENT:     Nose: Nose normal. No congestion.     Mouth/Throat:     Mouth: Mucous membranes are moist.     Pharynx: No oropharyngeal exudate.  Cardiovascular:     Rate and Rhythm: Normal rate and regular rhythm.     Pulses: Normal pulses.     Heart sounds: Normal heart sounds. No murmur. No friction rub. No gallop.   Pulmonary:     Effort: Pulmonary effort is normal. No respiratory distress.     Breath sounds: No stridor. Examination of the right-lower field reveals decreased breath sounds. Examination of the left-lower field reveals decreased breath sounds. Decreased breath sounds present. No wheezing, rhonchi or rales.  Chest:     Chest wall: No tenderness.  Skin:    General: Skin is warm.     Findings: Rash present.          Comments: puritic rash present  Neurological:     General: No focal deficit present.     Mental Status: She is alert and oriented to  person, place, and time.     Motor: Weakness present.  Psychiatric:        Mood and Affect: Mood normal.        Behavior: Behavior normal.        Judgment: Judgment normal.      UC Treatments / Results  Labs (all labs ordered are listed, but only abnormal results are displayed) Labs Reviewed - No data to display  EKG   Radiology DG Chest 2 View  Result Date: 03/31/2019 CLINICAL DATA:  Follow-up pneumonia.  Shortness of breath, weakness EXAM: CHEST - 2 VIEW COMPARISON:  03/02/2019 FINDINGS: Heart is normal size. Aorta is calcified and tortuous, nonaneurysmal. Patchy opacities in the left mid lung and right upper lobe again noted, slightly improved since prior study. No new confluent opacities or effusions. No acute bony abnormality. IMPRESSION: Improving bilateral left mid and right upper lobe opacities with minimal residual densities. Aortic atherosclerosis. Electronically Signed   By: 04/30/2019 M.D.   On: 03/31/2019 13:54    Procedures Procedures (including critical care time)  Medications Ordered in UC Medications  methylPREDNISolone sodium succinate (SOLU-MEDROL) 125 mg/2 mL injection 60 mg (60 mg Intramuscular Given 03/31/19 1346)    Initial Impression / Assessment and Plan / UC Course  I have reviewed the triage vital signs and the nursing notes.  Pertinent labs & imaging results that were available during my care of the patient were reviewed by me and considered in my medical decision making (see chart for details).    Checks x-ray  show improving bilateral left knee and right upper lobe opacity..  I have reviewed the x-ray myself and the radiologist interpretation.  I am in agreement with the radiologist interpretation.  Patient is stable at discharge. Claritin  was prescribed Prednisone was prescribed Follow with primary care Return for worsening of symptoms   Final Clinical Impressions(s) / UC Diagnoses   Final diagnoses:  Infestation by bed bug  Allergic  dermatitis  Shortness of breath     Discharge Instructions     Solu-Medrol 60 mg IM was given in office Prednisone was prescribed Claritin was prescribed for allergic dermatitis Resource was provided regarding bedbugs Follow-up with primary care Return for worsening of symptoms    ED Prescriptions    Medication Sig Dispense Auth. Provider   albuterol (VENTOLIN HFA) 108 (90 Base) MCG/ACT inhaler Inhale 1-2 puffs into the lungs every 6 (six) hours as needed for wheezing or shortness of breath. 18 g Lennie Vasco, March Rummage S, FNP   predniSONE (DELTASONE) 10 MG tablet Take 2 tablets (20 mg total) by mouth daily. 15 tablet Shermika Balthaser, Zachery Dakins, FNP   loratadine (CLARITIN) 10 MG tablet Take 1 tablet (10 mg total) by mouth daily. 30 tablet Tayleigh Wetherell, Zachery Dakins, FNP     PDMP not reviewed this encounter.   Durward Parcel, FNP 03/31/19 1404

## 2019-03-31 NOTE — ED Triage Notes (Signed)
Pt reports had pneumonia approx 1 month ago.  Pt also says had her first covid vaccine around Feb 12.  Pt says has finished her antibiotics but c/o weakness, sob, rash to left side of neck and shoulder.

## 2019-03-31 NOTE — Discharge Instructions (Addendum)
Solu-Medrol 60 mg IM was given in office Prednisone was prescribed Claritin was prescribed for allergic dermatitis Resource was provided regarding bedbugs Follow-up with primary care Return for worsening of symptoms

## 2019-04-05 ENCOUNTER — Other Ambulatory Visit: Payer: Self-pay | Admitting: Family Medicine

## 2019-04-05 ENCOUNTER — Other Ambulatory Visit: Payer: Self-pay

## 2019-04-05 ENCOUNTER — Ambulatory Visit (INDEPENDENT_AMBULATORY_CARE_PROVIDER_SITE_OTHER): Payer: Medicare Other | Admitting: Family Medicine

## 2019-04-05 ENCOUNTER — Encounter: Payer: Self-pay | Admitting: Family Medicine

## 2019-04-05 ENCOUNTER — Telehealth: Payer: Self-pay | Admitting: Emergency Medicine

## 2019-04-05 VITALS — BP 140/73 | HR 86 | Temp 97.6°F | Wt 120.8 lb

## 2019-04-05 DIAGNOSIS — K219 Gastro-esophageal reflux disease without esophagitis: Secondary | ICD-10-CM

## 2019-04-05 DIAGNOSIS — E119 Type 2 diabetes mellitus without complications: Secondary | ICD-10-CM | POA: Diagnosis not present

## 2019-04-05 DIAGNOSIS — I159 Secondary hypertension, unspecified: Secondary | ICD-10-CM

## 2019-04-05 DIAGNOSIS — H409 Unspecified glaucoma: Secondary | ICD-10-CM

## 2019-04-05 DIAGNOSIS — M810 Age-related osteoporosis without current pathological fracture: Secondary | ICD-10-CM

## 2019-04-05 DIAGNOSIS — J4521 Mild intermittent asthma with (acute) exacerbation: Secondary | ICD-10-CM

## 2019-04-05 DIAGNOSIS — E785 Hyperlipidemia, unspecified: Secondary | ICD-10-CM

## 2019-04-05 MED ORDER — FLUTICASONE-SALMETEROL 250-50 MCG/DOSE IN AEPB: 1.0000 | INHALATION_SPRAY | Freq: Two times a day (BID) | RESPIRATORY_TRACT | 3 refills | Status: DC

## 2019-04-05 NOTE — Telephone Encounter (Signed)
Spoke to [facility] they do not have any labs on patient. Just a covid test. Last labs was 2019. Spoke to patient she was not sure and we need to put orders in for patient to have labs done

## 2019-04-05 NOTE — Telephone Encounter (Signed)
Lab orders-pt can pick up or we can send

## 2019-04-05 NOTE — Patient Instructions (Addendum)
Start inhaled steroids after using ventolin twice a day Be sure to brush teeth and gargle after using inhaled steroids  Consider seeing a rheumatologist about your osteoporosis  Referral for eye exam Asthma, Adult  Asthma is a long-term (chronic) condition in which the airways get tight and narrow. The airways are the breathing passages that lead from the nose and mouth down into the lungs. A person with asthma will have times when symptoms get worse. These are called asthma attacks. They can cause coughing, whistling sounds when you breathe (wheezing), shortness of breath, and chest pain. They can make it hard to breathe. There is no cure for asthma, but medicines and lifestyle changes can help control it. There are many things that can bring on an asthma attack or make asthma symptoms worse (triggers). Common triggers include:  Mold.  Dust.  Cigarette smoke.  Cockroaches.  Things that can cause allergy symptoms (allergens). These include animal skin flakes (dander) and pollen from trees or grass.  Things that pollute the air. These may include household cleaners, wood smoke, smog, or chemical odors.  Cold air, weather changes, and wind.  Crying or laughing hard.  Stress.  Certain medicines or drugs.  Certain foods such as dried fruit, potato chips, and grape juice.  Infections, such as a cold or the flu.  Certain medical conditions or diseases.  Exercise or tiring activities. Asthma may be treated with medicines and by staying away from the things that cause asthma attacks. Types of medicines may include:  Controller medicines. These help prevent asthma symptoms. They are usually taken every day.  Fast-acting reliever or rescue medicines. These quickly relieve asthma symptoms. They are used as needed and provide short-term relief.  Allergy medicines if your attacks are brought on by allergens.  Medicines to help control the body's defense (immune) system. Follow these  instructions at home: Avoiding triggers in your home  Change your heating and air conditioning filter often.  Limit your use of fireplaces and wood stoves.  Get rid of pests (such as roaches and mice) and their droppings.  Throw away plants if you see mold on them.  Clean your floors. Dust regularly. Use cleaning products that do not smell.  Have someone vacuum when you are not home. Use a vacuum cleaner with a HEPA filter if possible.  Replace carpet with wood, tile, or vinyl flooring. Carpet can trap animal skin flakes and dust.  Use allergy-proof pillows, mattress covers, and box spring covers.  Wash bed sheets and blankets every week in hot water. Dry them in a dryer.  Keep your bedroom free of any triggers.  Avoid pets and keep windows closed when things that cause allergy symptoms are in the air.  Use blankets that are made of polyester or cotton.  Clean bathrooms and kitchens with bleach. If possible, have someone repaint the walls in these rooms with mold-resistant paint. Keep out of the rooms that are being cleaned and painted.  Wash your hands often with soap and water. If soap and water are not available, use hand sanitizer.  Do not allow anyone to smoke in your home. General instructions  Take over-the-counter and prescription medicines only as told by your doctor. ? Talk with your doctor if you have questions about how or when to take your medicines. ? Make note if you need to use your medicines more often than usual.  Do not use any products that contain nicotine or tobacco, such as cigarettes and e-cigarettes. If you  need help quitting, ask your doctor.  Stay away from secondhand smoke.  Avoid doing things outdoors when allergen counts are high and when air quality is low.  Wear a ski mask when doing outdoor activities in the winter. The mask should cover your nose and mouth. Exercise indoors on cold days if you can.  Warm up before you exercise. Take time  to cool down after exercise.  Use a peak flow meter as told by your doctor. A peak flow meter is a tool that measures how well the lungs are working.  Keep track of the peak flow meter's readings. Write them down.  Follow your asthma action plan. This is a written plan for taking care of your asthma and treating your attacks.  Make sure you get all the shots (vaccines) that your doctor recommends. Ask your doctor about a flu shot and a pneumonia shot.  Keep all follow-up visits as told by your doctor. This is important. Contact a doctor if:  You have wheezing, shortness of breath, or a cough even while taking medicine to prevent attacks.  The mucus you cough up (sputum) is thicker than usual.  The mucus you cough up changes from clear or white to yellow, green, gray, or bloody.  You have problems from the medicine you are taking, such as: ? A rash. ? Itching. ? Swelling. ? Trouble breathing.  You need reliever medicines more than 2-3 times a week.  Your peak flow reading is still at 50-79% of your personal best after following the action plan for 1 hour.  You have a fever. Get help right away if:  You seem to be worse and are not responding to medicine during an asthma attack.  You are short of breath even at rest.  You get short of breath when doing very little activity.  You have trouble eating, drinking, or talking.  You have chest pain or tightness.  You have a fast heartbeat.  Your lips or fingernails start to turn blue.  You are light-headed or dizzy, or you faint.  Your peak flow is less than 50% of your personal best.  You feel too tired to breathe normally. Summary  Asthma is a long-term (chronic) condition in which the airways get tight and narrow. An asthma attack can make it hard to breathe.  Asthma cannot be cured, but medicines and lifestyle changes can help control it.  Make sure you understand how to avoid triggers and how and when to use your  medicines. This information is not intended to replace advice given to you by your health care provider. Make sure you discuss any questions you have with your health care provider. Document Revised: 03/19/2018 Document Reviewed: 02/19/2016 Elsevier Patient Education  [employer].       If you have lab work done today you will be contacted with your lab results within the next 2 weeks.  If you have not heard from [place] then please contact us. The fastest way to get your results is to register for My Chart.   IF you received an x-ray today, you will receive an invoice from [facility]. Please contact [facility] at [phone] with questions or concerns regarding your invoice.   IF you received labwork today, you will receive an invoice from [place]. Please contact [facility] at [phone] with questions or concerns regarding your invoice.   Our billing staff will not be able to assist you with questions regarding bills from these companies.  You  will be contacted with the lab results as soon as they are available. The fastest way to get your results is to activate your My Chart account. Instructions are located on the last page of this paperwork. If you have not heard from [place] regarding the results in 2 weeks, please contact this office.

## 2019-04-05 NOTE — Progress Notes (Signed)
New Patient Office Visit  Subjective:  Patient ID: Sheila Myers, female    DOB: [birth year]  Age: 86 y.o. MRN: [id number]  CC:  Chief Complaint  Patient presents with  . Follow-up    2 week f/u from Urgent care. Asthma   HPI Sheila Myers presents for asthma 1-2 times /day-no recent use of inhaled steroids Glaucoma-drops bilat-concern with blurry vision since  -solumedrol IM and oral prednisone x 7 days Back fracture-2019 Osteoporosis--4.0 femur, -3.7 femur, forearm -3.2 DM-labwork in 11/20 GERD-nexium instead of protonix Past Medical History:  Diagnosis Date  . Anxiety   . ASCVD (arteriosclerotic cardiovascular disease)    H/o PCI  . Asthma   . Chronic back pain   . Chronic obstructive pulmonary disease ([place])   . Degenerative joint disease    Right shoulder pain-mechanism unclear  . Depression   . Diabetes mellitus type II   . Gastroesophageal reflux disease    Diverticulosis, hemorrhoids with rectal bleeding,  . Glaucoma   . Glaucoma   . Hyperlipidemia   . Hypertension   . Irritable bowel syndrome    constipation alternating with diarrhea  . Stroke [facility]) 2017   TIA    Past Surgical History:  Procedure Laterality Date  . ABDOMINAL HYSTERECTOMY    . APPENDECTOMY    . CHOLECYSTECTOMY    . COLONOSCOPY WITH ESOPHAGOGASTRODUODENOSCOPY (EGD)  2007   Dr. Oneida Alar: diverticulosis, external hemorrhoids, hiatal hernia, patent Schatzki ring.  . CORONARY ANGIOPLASTY WITH STENT PLACEMENT  1991  . ESOPHAGOGASTRODUODENOSCOPY  2012   Dr. Oneida Alar: Peptic stricture versus ring status post dilation, gastric inflammation and polyps,  biopsies unremarkable.  . ESOPHAGOGASTRODUODENOSCOPY (EGD) WITH PROPOFOL N/A 08/19/2017   Procedure: ESOPHAGOGASTRODUODENOSCOPY (EGD) WITH PROPOFOL;  Surgeon: Danie Binder, MD;  Location: [facility];  Service: Endoscopy;  Laterality: N/A;  2:15pm  . SAVORY DILATION N/A 08/19/2017   Procedure: SAVORY DILATION;  Surgeon: Danie Binder, MD;   Location: [facility];  Service: Endoscopy;  Laterality: N/A;    Family History  Problem Relation Age of Onset  . Aneurysm Mother        Cerebral  . Hypertension Mother   . Breast cancer Sister   . Breast cancer Sister   . Colon cancer Neg Hx   . Colon polyps Neg Hx     Social History   Socioeconomic History  . Marital status: Widowed    Spouse name: Not on file  . Number of children: Not on file  . Years of education: Not on file  . Highest education level: Not on file  Occupational History  . Occupation: Retired Quarry manager    Comment: [employer]  Tobacco Use  . Smoking status: Never Smoker  . Smokeless tobacco: Never Used  Substance and Sexual Activity  . Alcohol use: No  . Drug use: No  . Sexual activity: Never  Other Topics Concern  . Not on file  Social History Narrative  . Not on file   Social Determinants of Health   Financial Resource Strain:   . Difficulty of Paying Living Expenses: Not on file  Food Insecurity:   . Worried About Charity fundraiser in the Last Year: Not on file  . Ran Out of Food in the Last Year: Not on file  Transportation Needs:   . Lack of Transportation (Medical): Not on file  . Lack of Transportation (Non-Medical): Not on file  Physical Activity:   . Days of Exercise per  Week: Not on file  . Minutes of Exercise per Session: Not on file  Stress:   . Feeling of Stress : Not on file  Social Connections:   . Frequency of Communication with Friends and Family: Not on file  . Frequency of Social Gatherings with Friends and Family: Not on file  . Attends Religious Services: Not on file  . Active Member of Clubs or Organizations: Not on file  . Attends Banker Meetings: Not on file  . Marital Status: Not on file  Intimate Partner Violence:   . Fear of Current or Ex-Partner: Not on file  . Emotionally Abused: Not on file  . Physically Abused: Not on file  . Sexually Abused: Not on file    ROS Review of Systems   Eyes:       [place]  Respiratory:       Asthma/COPD Viral pneumonia-COVID negative  Cardiovascular: Negative.     Objective:   Today's Vitals: BP 140/73 (BP Location: Left Arm, Patient Position: Sitting, Cuff Size: Normal)   Pulse 86   Temp 97.6 F (36.4 C) (Temporal)   Wt 120 lb 12.8 oz (54.8 kg)   SpO2 97%   BMI 21.40 kg/m   Physical Exam Vitals reviewed.  Constitutional:      Appearance: Normal appearance.  HENT:     Head: Normocephalic and atraumatic.  Cardiovascular:     Rate and Rhythm: Normal rate and regular rhythm.     Pulses: Normal pulses.     Heart sounds: Normal heart sounds.  Pulmonary:     Effort: Pulmonary effort is normal.     Breath sounds: Normal breath sounds.  Musculoskeletal:     Cervical back: Normal range of motion and neck supple.  Neurological:     Mental Status: She is alert.  Psychiatric:        Mood and Affect: Mood normal.        Behavior: Behavior normal.     Assessment & Plan:  1. Glaucoma, unspecified glaucoma type, unspecified laterality - Ambulatory referral to Ophthalmology [place] 2. Type 2 diabetes mellitus without complication, without long-term current use of insulin (HCC) A1c 11/20  3. Hyperlipidemia, unspecified hyperlipidemia type pravachol-stopped per pt 4. Secondary hypertension Amlodipine/benazepril daily-bp readings at home  5. Osteoporosis, unspecified osteoporosis type, unspecified pathological fracture presence Fosamax 6. Mild intermittent asthma with acute exacerbation Ventolin-prn advair -start to prevent pt needing oral steroids  7. Gastroesophageal reflux disease, unspecified whether esophagitis present Nexium-GI following-pt was on protonix previously-Dr. Darrick Penna following  Outpatient Encounter Medications as of 04/05/2019  Medication Sig  . albuterol (VENTOLIN HFA) 108 (90 Base) MCG/ACT inhaler Inhale 1-2 puffs into the lungs every 6 (six) hours as needed for wheezing or shortness of breath.  Marland Kitchen  alendronate (FOSAMAX) 70 MG tablet Takes 70mg  by mouth every 7 days on Mondays.  04-26-1971 amLODipine-benazepril (LOTREL) 10-20 MG capsule Take 1 capsule by mouth daily.  . bimatoprost (LUMIGAN) 0.03 % ophthalmic drops Place 1 drop into both eyes at bedtime.    Marland Kitchen HYDROcodone-acetaminophen (NORCO) 10-325 MG tablet Take 1 tablet by mouth every 6 (six) hours as needed for pain.  Marland Kitchen loratadine (CLARITIN) 10 MG tablet Take 1 tablet (10 mg total) by mouth daily.  Marland Kitchen NEXIUM 40 MG capsule Take 1 capsule (40 mg total) by mouth daily at 12 noon.  . pantoprazole (PROTONIX) 40 MG tablet Take 1 tablet by mouth 2 (two) times daily.  . predniSONE (DELTASONE) 10 MG tablet Take 2 tablets (  20 mg total) by mouth daily.  . traZODone (DESYREL) 50 MG tablet Take 50 mg by mouth at bedtime.  . [DISCONTINUED] aspirin EC 81 MG tablet Take 81 mg by mouth every other day.   . [DISCONTINUED] ipratropium-albuterol (DUONEB) 0.5-2.5 (3) MG/3ML SOLN Inhale 3 mLs into the lungs every 6 (six) hours as needed (wheezing or shortness of breath).   . [DISCONTINUED] mometasone (ELOCON) 0.1 % cream Apply 1 application topically at bedtime. Applies to lower legs  . [DISCONTINUED] pravastatin (PRAVACHOL) 40 MG tablet Take 40 mg by mouth daily.   No facility-administered encounter medications on file as of 04/05/2019.    Follow-up: 1 month -recheck asthma Eye exam for glaucoma  Makila Colombe Mat Carne, MD

## 2019-04-07 NOTE — Telephone Encounter (Signed)
Patient was informed.

## 2019-04-14 ENCOUNTER — Telehealth: Payer: Self-pay

## 2019-04-14 NOTE — Telephone Encounter (Signed)
PA for Nexium was approved though 01/28/20. Approval letter will be scanned in pts chart.

## 2019-04-14 NOTE — Telephone Encounter (Signed)
PA for Nexium was approved through 01/28/2020. Approval letter will be scanned in chart.

## 2019-04-16 DIAGNOSIS — J449 Chronic obstructive pulmonary disease, unspecified: Secondary | ICD-10-CM | POA: Diagnosis not present

## 2019-05-10 ENCOUNTER — Ambulatory Visit: Payer: Medicare Other | Admitting: Family Medicine

## 2019-05-11 ENCOUNTER — Other Ambulatory Visit: Payer: Self-pay

## 2019-05-11 ENCOUNTER — Encounter: Payer: Self-pay | Admitting: Family Medicine

## 2019-05-11 ENCOUNTER — Ambulatory Visit (INDEPENDENT_AMBULATORY_CARE_PROVIDER_SITE_OTHER): Payer: Medicare Other | Admitting: Family Medicine

## 2019-05-11 VITALS — BP 113/66 | HR 83 | Temp 97.8°F | Ht 63.0 in | Wt 119.0 lb

## 2019-05-11 DIAGNOSIS — I159 Secondary hypertension, unspecified: Secondary | ICD-10-CM | POA: Diagnosis not present

## 2019-05-11 DIAGNOSIS — J4521 Mild intermittent asthma with (acute) exacerbation: Secondary | ICD-10-CM | POA: Diagnosis not present

## 2019-05-11 DIAGNOSIS — M810 Age-related osteoporosis without current pathological fracture: Secondary | ICD-10-CM

## 2019-05-11 DIAGNOSIS — H409 Unspecified glaucoma: Secondary | ICD-10-CM

## 2019-05-11 NOTE — Patient Instructions (Addendum)
  Eye doctor appointment-will call on time/day  If you have lab work done today you will be contacted with your lab results within the next 2 weeks.  If you have not heard from [place] then please contact us. The fastest way to get your results is to register for My Chart.   IF you received an x-ray today, you will receive an invoice from [facility]. Please contact [facility] at [phone] with questions or concerns regarding your invoice.   IF you received labwork today, you will receive an invoice from [place]. Please contact [facility] at [phone] with questions or concerns regarding your invoice.   Our billing staff will not be able to assist you with questions regarding bills from these companies.  You will be contacted with the lab results as soon as they are available. The fastest way to get your results is to activate your My Chart account. Instructions are located on the last page of this paperwork. If you have not heard from [place] regarding the results in 2 weeks, please contact this office.

## 2019-05-11 NOTE — Progress Notes (Signed)
Established Patient Office Visit  Subjective:  Patient ID: Sheila Myers, female    DOB: 1928-02-28  Age: 86 y.o. MRN: [id number] Prescriptions Total Prescriptions: 23   Total Private Pay: 0   Fill Date ID   Written Drug Qty Days Prescriber Rx # Pharmacy Refill   Daily Dose* Pymt Type PMP    02/12/2019  1   02/10/2019  Tramadol Hcl 50 MG Tablet  60.00  30 Ay Pow   [id number]   [facility] (0019)   0  10.00 MME  Medicare   [place]  01/11/2019  1   01/11/2019  Hydrocodone-Acetamin 10-325 MG  120.00  30 Ed Haw   [id number]   [facility] (0019)   0  40.00 MME  Medicare   [place]  12/16/2018  1   12/16/2018  Tramadol Hcl 50 MG Tablet  60.00  30 Ay Pow   [id number]   [facility] (0019)   0  10.00 MME  Comm Ins   [place]  12/10/2018  1   12/10/2018  Hydrocodone-Acetamin 10-325 MG  120.00  30 Ed Haw   [id number]   [facility] (0019)   0  40.00 MME  Comm Ins   [place]  11/11/2018  1   11/11/2018  Hydrocodone-Acetamin 10-325 MG  120.00  30 Ed Haw   [id number]   [facility] (0019)   0  40.00 MME  Comm Ins   [place]  10/14/2018  1   10/14/2018  Hydrocodone-Acetamin 10-325 MG  120.00  30 Ed Haw   [id number]   [facility] (0019)   0  40.00 MME  Comm Ins   [place]  09/16/2018  1   09/16/2018  Hydrocodone-Acetamin 10-325 MG  120.00  30 Ed Haw   [id number]   [facility] (0019)   0  40.00 MME  Comm Ins   [place]  08/18/2018  1   08/17/2018  Hydrocodone-Acetamin 10-325 MG  120.00  30 Ed Haw   [id number]   [facility] (0019)   0  40.00 MME  Comm Ins   [place]  07/21/2018  1   07/21/2018  Hydrocodone-Acetamin 10-325 MG  120.00  30 Ed Haw   [id number]   [facility] (0019)   0  40.00 MME  Comm Ins   [place]  06/18/2018  1   06/18/2018  Hydrocodone-Acetamin 10-325 MG  120.00  30 Ed Haw   [id number]   [facility] (0019)   0  40.00 MME  Comm Ins   [place]  05/20/2018  1   05/20/2018  Hydrocodone-Acetamin 10-325 MG  120.00  30 Ed Haw   [id number]   [facility] (0019)   0  40.00 MME  Comm Ins   [place]  04/20/2018  1   04/20/2018  Hydrocodone-Acetamin 10-325 MG  120.00  30 Ed Haw   [id number]   [facility] (0019)   0  40.00 MME  Comm Ins   [place]  03/21/2018  1   03/19/2018  Hydrocodone-Acetamin 10-325 MG  120.00  30  Ed Haw   [id number]   [facility] (0019)   0  40.00 MME  Comm Ins   [place]  02/22/2018  1   12/23/2017  Hydrocodone-Acetamin 10-325 MG              CC:  Pt here for follow up-SOB with wheezing-advair started with albuterol-  HPI Sheila Myers presents for RAD improved with advair and albuterol. Pt states no wheezing, no congestion. Pt has not needed increase use of albuterol over the past few weeks. Pt  with pain medication in the past-given by Dr. Lynnae Sandhoff Hydrocodone/month-last medication written 12/20-pt switched to Tramadol by neuro-seen in Nov and Jan. NO additional medication written-chronic back pain. Pt states increase in fatigue since taking COVID vaccine. Pt has not seen her eye doctor for glaucoma.  Past Medical History:  Diagnosis Date  . Anxiety   . ASCVD (arteriosclerotic cardiovascular disease)    H/o PCI  . Asthma   . Chronic back pain   . Chronic obstructive pulmonary disease ([place])   . Degenerative joint disease    Right shoulder pain-mechanism unclear  . Depression   . Diabetes mellitus type II   . Gastroesophageal reflux disease    Diverticulosis, hemorrhoids with rectal bleeding,  . Glaucoma   . Glaucoma   . Hyperlipidemia   . Hypertension   . Irritable bowel syndrome    constipation alternating with diarrhea  . Stroke [facility]) 2017   TIA    Past Surgical History:  Procedure Laterality Date  . ABDOMINAL HYSTERECTOMY    . APPENDECTOMY    . CHOLECYSTECTOMY    . COLONOSCOPY WITH ESOPHAGOGASTRODUODENOSCOPY (EGD)  2007   Dr. Oneida Alar: diverticulosis, external hemorrhoids, hiatal hernia, patent Schatzki ring.  . CORONARY ANGIOPLASTY WITH STENT PLACEMENT  1991  . ESOPHAGOGASTRODUODENOSCOPY  2012   Dr. Oneida Alar: Peptic stricture versus ring status post dilation, gastric inflammation and polyps,  biopsies unremarkable.  . ESOPHAGOGASTRODUODENOSCOPY (EGD) WITH PROPOFOL N/A 08/19/2017   Procedure: ESOPHAGOGASTRODUODENOSCOPY (EGD) WITH PROPOFOL;  Surgeon: Danie Binder, MD;   [facility];  Service: Endoscopy;  Laterality: N/A;  2:15pm  . SAVORY DILATION N/A 08/19/2017   Procedure: SAVORY DILATION;  Surgeon: Danie Binder, MD;  Location: [facility];  Service: Endoscopy;  Laterality: N/A;    Family History  Problem Relation Age of Onset  . Aneurysm Mother        Cerebral  . Hypertension Mother   . Breast cancer Sister   . Breast cancer Sister   . Colon cancer Neg Hx   . Colon polyps Neg Hx     Social History   Socioeconomic History  . Marital status: Widowed    Spouse name: Not on file  . Number of children: Not on file  . Years of education: Not on file  . Highest education level: Not on file  Occupational History  . Occupation: Retired Quarry manager    Comment: [employer]  Tobacco Use  . Smoking status: Never Smoker  . Smokeless tobacco: Never Used  Substance and Sexual Activity  . Alcohol use: No  . Drug use: No  . Sexual activity: Never  Other Topics Concern  . Not on file  Social History Narrative  . Not on file   Social Determinants of Health   Financial Resource Strain:   . Difficulty of Paying Living Expenses:   Food Insecurity:   . Worried About Charity fundraiser in the Last Year:   . Arboriculturist in the Last Year:   Transportation Needs:   . Film/video editor (Medical):   Marland Kitchen Lack of Transportation (Non-Medical):   Physical Activity:   . Days of Exercise per Week:   . Minutes of Exercise per Session:   Stress:   . Feeling of Stress :   Social Connections:   . Frequency of Communication with Friends and Family:   . Frequency of Social Gatherings with Friends and Family:   . Attends Religious Services:   . Active Member of Clubs  or Organizations:   . Attends Banker Meetings:   Marland Kitchen Marital Status:   Intimate Partner Violence:   . Fear of Current or Ex-Partner:   . Emotionally Abused:   Marland Kitchen Physically Abused:   . Sexually Abused:     Outpatient Medications Prior to Visit  Medication  Sig Dispense Refill  . albuterol (VENTOLIN HFA) 108 (90 Base) MCG/ACT inhaler Inhale 1-2 puffs into the lungs every 6 (six) hours as needed for wheezing or shortness of breath. 18 g 0  . alendronate (FOSAMAX) 70 MG tablet Takes 70mg  by mouth every 7 days on Mondays.  10  . amLODipine-benazepril (LOTREL) 10-20 MG capsule Take 1 capsule by mouth daily.    . bimatoprost (LUMIGAN) 0.03 % ophthalmic drops Place 1 drop into both eyes at bedtime.      . Fluticasone-Salmeterol (ADVAIR DISKUS) 250-50 MCG/DOSE AEPB Inhale 1 puff into the lungs 2 (two) times daily. 1 each 3  . HYDROcodone-acetaminophen (NORCO) 10-325 MG tablet Take 1 tablet by mouth every 6 (six) hours as needed for pain.  0  . loratadine (CLARITIN) 10 MG tablet Take 1 tablet (10 mg total) by mouth daily. 30 tablet 0  . NEXIUM 40 MG capsule Take 1 capsule (40 mg total) by mouth daily at 12 noon. 30 capsule 5  . pantoprazole (PROTONIX) 40 MG tablet Take 1 tablet by mouth 2 (two) times daily.    . predniSONE (DELTASONE) 10 MG tablet Take 2 tablets (20 mg total) by mouth daily. 15 tablet 0  . traZODone (DESYREL) 50 MG tablet Take 50 mg by mouth at bedtime.  5   No facility-administered medications prior to visit.    Allergies  Allergen Reactions  . Peanut-Containing Drug Products Anaphylaxis, Itching and Rash    Allergy not confirmed-patient also mentions a rash and itching when eating peanut butter  . Catapres [Clonidine Hcl] Other (See Comments)    Dry mouth  . Codeine Other (See Comments)    "I just freak out"  . Morphine Other (See Comments)    "I just freak out"    ROS Review of Systems  Constitutional: Positive for fatigue. Negative for fever.  Eyes:       Glaucoma  Musculoskeletal: Positive for back pain.      Objective:    Physical Exam  Constitutional: She is oriented to person, place, and time. She appears well-developed and well-nourished.  HENT:  Head: Normocephalic and atraumatic.  Eyes: Conjunctivae are  normal.  Cardiovascular: Normal rate, regular rhythm, normal heart sounds and intact distal pulses.  Pulmonary/Chest: Effort normal and breath sounds normal.  Musculoskeletal:        General: Normal range of motion.     Cervical back: Normal range of motion and neck supple.  Neurological: She is oriented to person, place, and time.  Psychiatric: She has a normal mood and affect. Her behavior is normal.    BP 113/66 (BP Location: Right Arm, Patient Position: Sitting, Cuff Size: Normal)   Pulse 83   Temp 97.8 F (36.6 C) (Temporal)   Ht 5\' 3"  (1.6 m)   Wt 119 lb (54 kg)   SpO2 97%   BMI 21.08 kg/m  Wt Readings from Last 3 Encounters:  05/11/19 119 lb (54 kg)  04/05/19 120 lb 12.8 oz (54.8 kg)  03/31/19 120 lb (54.4 kg)     Health Maintenance Due  Topic Date Due  . FOOT EXAM  Never done  . OPHTHALMOLOGY EXAM  Never done  .  TETANUS/TDAP  Never done  . PNA vac Low Risk Adult (1 of 2 - PCV13) Never done  . HEMOGLOBIN A1C  02/05/2016    Lab Results  Component Value Date   TSH 0.627 09/09/2011   Lab Results  Component Value Date   WBC 8.4 08/12/2017   HGB 13.5 08/12/2017   HCT 42.8 08/12/2017   MCV 71.9 (L) 08/12/2017   PLT 227 08/12/2017   Lab Results  Component Value Date   NA 140 08/12/2017   K 3.3 (L) 08/12/2017   CO2 29 08/12/2017   GLUCOSE 113 (H) 08/12/2017   BUN 14 08/12/2017   CREATININE 0.87 08/12/2017   BILITOT 1.4 (H) 04/16/2016   ALKPHOS 58 04/16/2016   AST 18 04/16/2016   ALT 17 04/16/2016   PROT 7.3 04/16/2016   ALBUMIN 3.8 04/16/2016   CALCIUM 9.6 08/12/2017   ANIONGAP 7 08/12/2017   Lab Results  Component Value Date   CHOL 154 08/05/2015   Lab Results  Component Value Date   HDL 34 (L) 08/05/2015   Lab Results  Component Value Date   LDLCALC 104 (H) 08/05/2015   Lab Results  Component Value Date   TRIG 80 08/05/2015   Lab Results  Component Value Date   CHOLHDL 4.5 08/05/2015   Lab Results  Component Value Date   HGBA1C  5.9 (H) 08/05/2015      Assessment & Plan:   1. Osteoporosis, unspecified osteoporosis type, unspecified pathological fracture presence appt with rheumatologist  2. Glaucoma, unspecified glaucoma type, unspecified laterality Needs eye specialist appt  3. Secondary hypertension Stable today  4. Mild intermittent asthma with acute exacerbation Stable on advair +ventolin-no wheezing or SOB Follow-up:    Teryn Gust Mat Carne, MD

## 2019-06-08 LAB — COMPLETE METABOLIC PANEL WITH GFR
AG Ratio: 1.4 (calc) (ref 1.0–2.5)
ALT: 8 U/L (ref 6–29)
AST: 12 U/L (ref 10–35)
Albumin: 3.8 g/dL (ref 3.6–5.1)
Alkaline phosphatase (APISO): 44 U/L (ref 37–153)
BUN: 13 mg/dL (ref 7–25)
CO2: 30 mmol/L (ref 20–32)
Calcium: 9.1 mg/dL (ref 8.6–10.4)
Chloride: 104 mmol/L (ref 98–110)
Creat: 0.73 mg/dL (ref 0.60–0.88)
GFR, Est African American: 85 mL/min/{1.73_m2} (ref >=60)
GFR, Est Non African American: 74 mL/min/{1.73_m2} (ref >=60)
Globulin: 2.7 g/dL (calc) (ref 1.9–3.7)
Glucose, Bld: 121 mg/dL — ABNORMAL HIGH (ref 65–99)
Potassium: 3.3 mmol/L — ABNORMAL LOW (ref 3.5–5.3)
Sodium: 141 mmol/L (ref 135–146)
Total Bilirubin: 0.5 mg/dL (ref 0.2–1.2)
Total Protein: 6.5 g/dL (ref 6.1–8.1)

## 2019-06-08 LAB — CBC
HCT: 38 % (ref 35.0–45.0)
Hemoglobin: 11.5 g/dL — ABNORMAL LOW (ref 11.7–15.5)
MCH: 22.2 pg — ABNORMAL LOW (ref 27.0–33.0)
MCHC: 30.3 g/dL — ABNORMAL LOW (ref 32.0–36.0)
MCV: 73.5 fL — ABNORMAL LOW (ref 80.0–100.0)
MPV: 11.9 fL (ref 7.5–12.5)
Platelets: 265 10*3/uL (ref 140–400)
RBC: 5.17 10*6/uL — ABNORMAL HIGH (ref 3.80–5.10)
RDW: 15.7 % — ABNORMAL HIGH (ref 11.0–15.0)
WBC: 7.3 10*3/uL (ref 3.8–10.8)

## 2019-06-08 LAB — LIPID PANEL
Cholesterol: 170 mg/dL (ref <200)
HDL: 50 mg/dL (ref >=50)
LDL Cholesterol (Calc): 100 mg/dL (calc) — ABNORMAL HIGH
Non-HDL Cholesterol (Calc): 120 mg/dL (calc) (ref <130)
Total CHOL/HDL Ratio: 3.4 (calc) (ref <5.0)
Triglycerides: 103 mg/dL (ref <150)

## 2019-06-08 LAB — TSH: TSH: 1.14 mIU/L (ref 0.40–4.50)

## 2019-06-08 LAB — VITAMIN D 25 HYDROXY (VIT D DEFICIENCY, FRACTURES): Vit D, 25-Hydroxy: 11 ng/mL — ABNORMAL LOW (ref 30–100)

## 2019-06-10 ENCOUNTER — Other Ambulatory Visit: Payer: Self-pay | Admitting: Family Medicine

## 2019-06-10 DIAGNOSIS — D649 Anemia, unspecified: Secondary | ICD-10-CM

## 2019-06-10 MED ORDER — VITAMIN D (ERGOCALCIFEROL) 1.25 MG (50000 UNIT) PO CAPS: 50000.0000 [IU] | ORAL_CAPSULE | ORAL | 0 refills | Status: DC

## 2019-06-16 ENCOUNTER — Encounter: Payer: Self-pay | Admitting: Internal Medicine

## 2019-07-28 ENCOUNTER — Encounter: Payer: Self-pay | Admitting: Nurse Practitioner

## 2019-07-28 ENCOUNTER — Ambulatory Visit: Payer: Medicare Other | Admitting: Nurse Practitioner

## 2019-07-28 NOTE — Progress Notes (Deleted)
Referring Provider: Wandra Feinstein, MD Primary Care Physician:  Wandra Feinstein, MD Primary GI:  Dr. Jena Gauss (in the absence of Dr. Darrick Penna); pending Dr. Marletta Lor  No chief complaint on file.   HPI:   Sheila Myers is a 86 y.o. female who presents on referral from primary care for anemia.  The patient was last seen in our office 11/03/2018 for GERD and dysphagia.  BPE on file with age-related dysmotility, incomplete clearance of barium by primary peristaltic waves, intermittent retrograde peristalsis, no esophageal mass or stricture, tiny Zenker's diverticulum.  EGD previously well managed on Nexium but insurance stopped paying.  EGD on file 08/19/2017 with benign-appearing esophageal stenosis status post dilation, mild gastritis due to aspirin.  We were able to reobtain Nexium twice daily for her but generic does not work as well.  At her last visit on Protonix 40 mg twice daily which was changed by primary care and she does not feel works very well.  Has GERD breakthrough to 3 times a week with associated cough.  Is wanting to switch back to Nexium, PA is on file as per phone note 07/07/2018.  No other overt GI complaints.  Recommended switch back to Nexium and prescription was provided, stop Protonix, asked the patient to have any provider who wants to change her PPI from Nexium to another medication to call our office first, follow-up in 1 year.  The patient was seen by primary care 05/11/2019 for routine care.  Some shortness of breath but improved on Advair and albuterol.  Some increased fatigue after COVID-19 vaccination.  Recommended updated labs.  Labs completed 06/08/2019 with CMP noting mild hypokalemia otherwise essentially normal, TSH normal, vitamin D deficient at 11. CBC found new anemia with hemoglobin of 11.5 (previous baseline 13.5-14.3).  This is noted to be microcytic and hypochromic.  She was subsequently referred to GI for further evaluation.  Last colonoscopy 11/11/2005 with  diverticulosis and external hemorrhoids and recommended repeat in 10 years.  EGD at that time with a small hiatal hernia otherwise normal.  Does not appear a repeat colonoscopy was completed.  Endoscopy was completed 08/19/2017 as outlined above.  Today she states   Past Medical History:  Diagnosis Date  . Anxiety   . ASCVD (arteriosclerotic cardiovascular disease)    H/o PCI  . Asthma   . Chronic back pain   . Chronic obstructive pulmonary disease ([facility])   . Degenerative joint disease    Right shoulder pain-mechanism unclear  . Depression   . Diabetes mellitus type II   . Gastroesophageal reflux disease    Diverticulosis, hemorrhoids with rectal bleeding,  . Glaucoma   . Glaucoma   . Hyperlipidemia   . Hypertension   . Irritable bowel syndrome    constipation alternating with diarrhea  . Stroke [facility]) 2017   TIA    Past Surgical History:  Procedure Laterality Date  . ABDOMINAL HYSTERECTOMY    . APPENDECTOMY    . CHOLECYSTECTOMY    . COLONOSCOPY WITH ESOPHAGOGASTRODUODENOSCOPY (EGD)  2007   Dr. Darrick Penna: diverticulosis, external hemorrhoids, hiatal hernia, patent Schatzki ring.  . CORONARY ANGIOPLASTY WITH STENT PLACEMENT  1991  . ESOPHAGOGASTRODUODENOSCOPY  2012   Dr. Darrick Penna: Peptic stricture versus ring status post dilation, gastric inflammation and polyps,  biopsies unremarkable.  . ESOPHAGOGASTRODUODENOSCOPY (EGD) WITH PROPOFOL N/A 08/19/2017   Procedure: ESOPHAGOGASTRODUODENOSCOPY (EGD) WITH PROPOFOL;  Surgeon: West Bali, MD;  [facility];  Service: Endoscopy;  Laterality: N/A;  2:15pm  .  SAVORY DILATION N/A 08/19/2017   Procedure: SAVORY DILATION;  Surgeon: West Bali, MD;  [facility];  Service: Endoscopy;  Laterality: N/A;    Current Outpatient Medications  Medication Sig Dispense Refill  . albuterol (VENTOLIN HFA) 108 (90 Base) MCG/ACT inhaler Inhale 1-2 puffs into the lungs every 6 (six) hours as needed for wheezing or shortness of  breath. 18 g 0  . alendronate (FOSAMAX) 70 MG tablet Takes 70mg  by mouth every 7 days on Mondays.  10  . amLODipine-benazepril (LOTREL) 10-20 MG capsule Take 1 capsule by mouth daily.    . bimatoprost (LUMIGAN) 0.03 % ophthalmic drops Place 1 drop into both eyes at bedtime.      . Fluticasone-Salmeterol (ADVAIR DISKUS) 250-50 MCG/DOSE AEPB Inhale 1 puff into the lungs 2 (two) times daily. 1 each 3  . HYDROcodone-acetaminophen (NORCO) 10-325 MG tablet Take 1 tablet by mouth every 6 (six) hours as needed for pain.  0  . loratadine (CLARITIN) 10 MG tablet Take 1 tablet (10 mg total) by mouth daily. 30 tablet 0  . NEXIUM 40 MG capsule Take 1 capsule (40 mg total) by mouth daily at 12 noon. 30 capsule 5  . pantoprazole (PROTONIX) 40 MG tablet Take 1 tablet by mouth 2 (two) times daily.    . predniSONE (DELTASONE) 10 MG tablet Take 2 tablets (20 mg total) by mouth daily. 15 tablet 0  . traZODone (DESYREL) 50 MG tablet Take 50 mg by mouth at bedtime.  5  . Vitamin D, Ergocalciferol, (DRISDOL) 1.25 MG (50000 UNIT) CAPS capsule Take 1 capsule (50,000 Units total) by mouth every 7 (seven) days. 12 capsule 0   No current facility-administered medications for this visit.    Allergies as of 07/28/2019 - Review Complete 05/11/2019  Allergen Reaction Noted  . Peanut-containing drug products Anaphylaxis, Itching, and Rash 03/31/2008  . Catapres [clonidine hcl] Other (See Comments) 09/16/2011  . Codeine Other (See Comments) 05/10/2008  . Morphine Other (See Comments) 05/10/2008    Family History  Problem Relation Age of Onset  . Aneurysm Mother        Cerebral  . Hypertension Mother   . Breast cancer Sister   . Breast cancer Sister   . Colon cancer Neg Hx   . Colon polyps Neg Hx     Social History   Socioeconomic History  . Marital status: Widowed    Spouse name: Not on file  . Number of children: Not on file  . Years of education: Not on file  . Highest education level: Not on file    Occupational History  . Occupation: Retired 05/12/2008    Comment: [employer]  Tobacco Use  . Smoking status: Never Smoker  . Smokeless tobacco: Never Used  Vaping Use  . Vaping Use: Never used  Substance and Sexual Activity  . Alcohol use: No  . Drug use: No  . Sexual activity: Never  Other Topics Concern  . Not on file  Social History Narrative  . Not on file   Social Determinants of Health   Financial Resource Strain:   . Difficulty of Paying Living Expenses:   Food Insecurity:   . Worried About [facility] in the Last Year:   . Programme researcher, broadcasting/film/video in the Last Year:   Transportation Needs:   . Barista (Medical):   Freight forwarder Lack of Transportation (Non-Medical):   Physical Activity:   . Days of Exercise per Week:   .  Minutes of Exercise per Session:   Stress:   . Feeling of Stress :   Social Connections:   . Frequency of Communication with Friends and Family:   . Frequency of Social Gatherings with Friends and Family:   . Attends Religious Services:   . Active Member of Clubs or Organizations:   . Attends Banker Meetings:   Marland Kitchen Marital Status:     Subjective: Review of Systems  Constitutional: Negative for chills, fever, malaise/fatigue and weight loss.  HENT: Negative for congestion and sore throat.   Respiratory: Negative for cough and shortness of breath.   Cardiovascular: Negative for chest pain and palpitations.  Gastrointestinal: Negative for abdominal pain, blood in stool, diarrhea, melena, nausea and vomiting.  Musculoskeletal: Negative for joint pain and myalgias.  Skin: Negative for rash.  Neurological: Negative for dizziness and weakness.  Endo/Heme/Allergies: Does not bruise/bleed easily.  Psychiatric/Behavioral: Negative for depression. The patient is not nervous/anxious.   All other systems reviewed and are negative.    Objective: There were no vitals taken for this visit. Physical Exam Vitals and nursing note  reviewed.  Constitutional:      General: She is not in acute distress.    Appearance: Normal appearance. She is well-developed. She is not ill-appearing, toxic-appearing or diaphoretic.  HENT:     Head: Normocephalic and atraumatic.     Nose: No congestion or rhinorrhea.  Eyes:     General: No scleral icterus. Cardiovascular:     Rate and Rhythm: Normal rate and regular rhythm.     Heart sounds: Normal heart sounds.  Pulmonary:     Effort: Pulmonary effort is normal. No respiratory distress.     Breath sounds: Normal breath sounds.  Abdominal:     General: Bowel sounds are normal.     Palpations: Abdomen is soft. There is no hepatomegaly, splenomegaly or mass.     Tenderness: There is no abdominal tenderness. There is no guarding or rebound.     Hernia: No hernia is present.  Skin:    General: Skin is warm and dry.     Coloration: Skin is not jaundiced.     Findings: No rash.  Neurological:     General: No focal deficit present.     Mental Status: She is alert and oriented to person, place, and time.  Psychiatric:        Attention and Perception: Attention normal.        Mood and Affect: Mood normal.        Speech: Speech normal.        Behavior: Behavior normal.        Thought Content: Thought content normal.        Cognition and Memory: Cognition and memory normal.       07/28/2019 2:00 PM   Disclaimer: This note was dictated with voice recognition software. Similar sounding words can inadvertently be transcribed and may not be corrected upon review.

## 2019-08-23 ENCOUNTER — Other Ambulatory Visit: Payer: Self-pay | Admitting: Nurse Practitioner

## 2019-08-23 DIAGNOSIS — R1319 Other dysphagia: Secondary | ICD-10-CM

## 2019-08-23 DIAGNOSIS — K219 Gastro-esophageal reflux disease without esophagitis: Secondary | ICD-10-CM

## 2019-09-05 ENCOUNTER — Other Ambulatory Visit: Payer: Self-pay | Admitting: Family Medicine

## 2019-09-18 IMAGING — MR MR LUMBAR SPINE W/O CM
4 of 5 series · 14 of 48 positions shown · non-contrast
Comparison: Lumbar radiographs 12/03/2017. Lumbar spine CT
09/30/2010.

CLINICAL DATA: 89-year-old female with low back pain radiating to
the right leg. Radiculopathy.

EXAM:
MRI LUMBAR SPINE WITHOUT CONTRAST
TECHNIQUE: Multiplanar, multisequence MR imaging of the lumbar spine was
performed. No intravenous contrast was administered.

[Series 3: T2 · sagittal · 4.0mm · 0.73mm/px · 5 of 15 slices shown (1 of 2)]
[im 1/15]
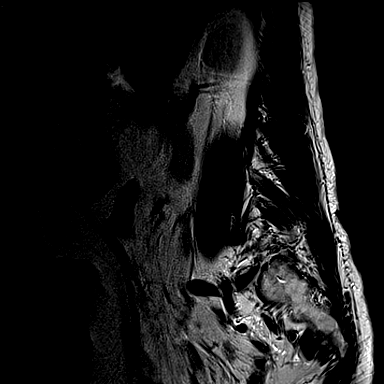
[im 3/15]
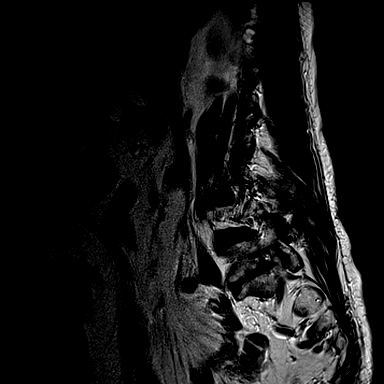
[im 6/15]
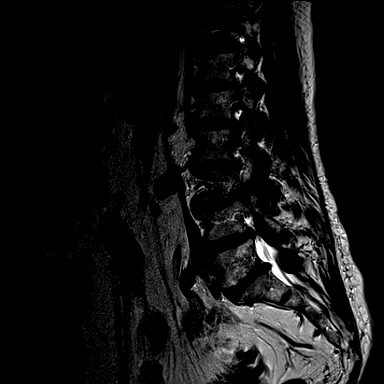
[im 9/15]
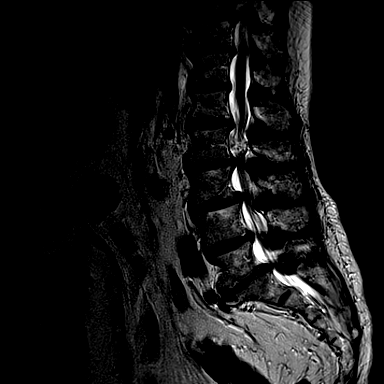
[im 15/15]
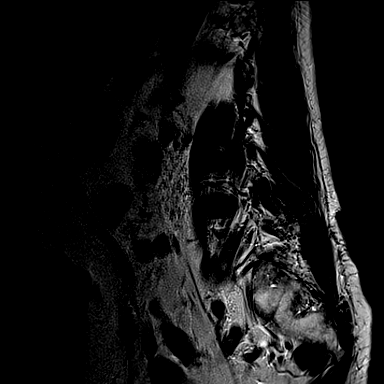

[Series 4: T1 · sagittal · 4.0mm · 0.36mm/px · 3 of 15 slices shown (1 of 2)]
[im 1/15]
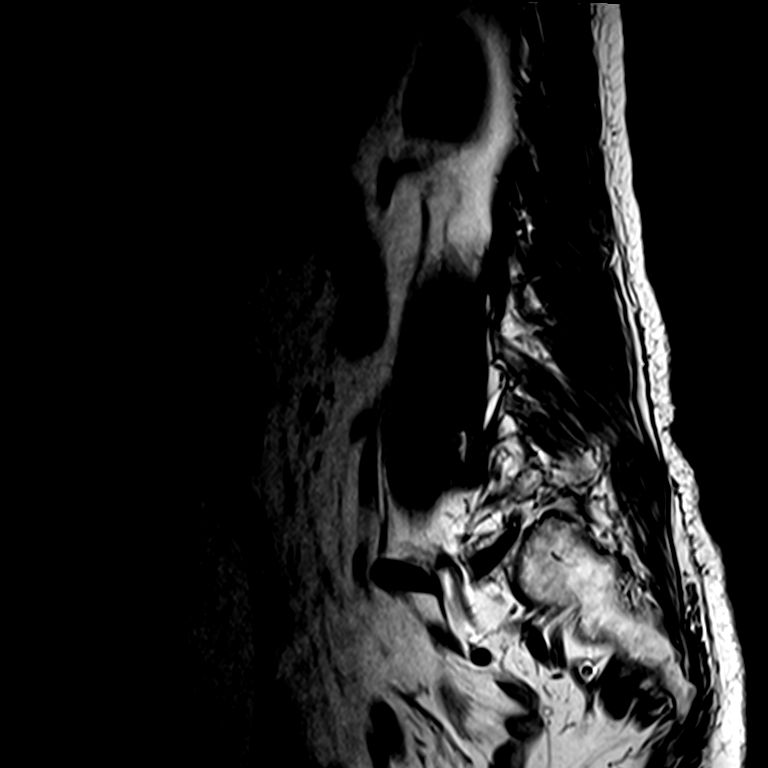
[im 8/15]
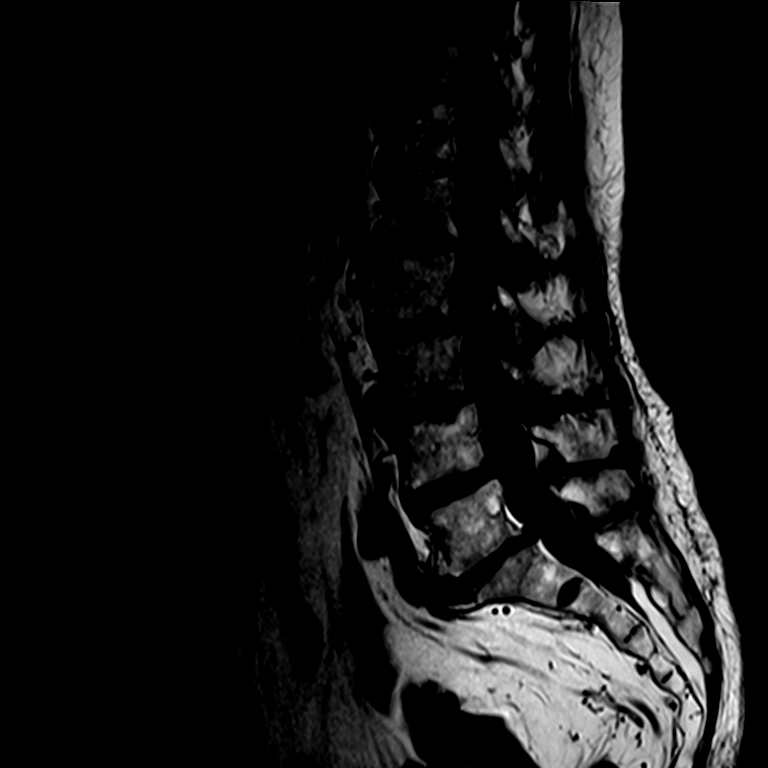
[im 15/15]
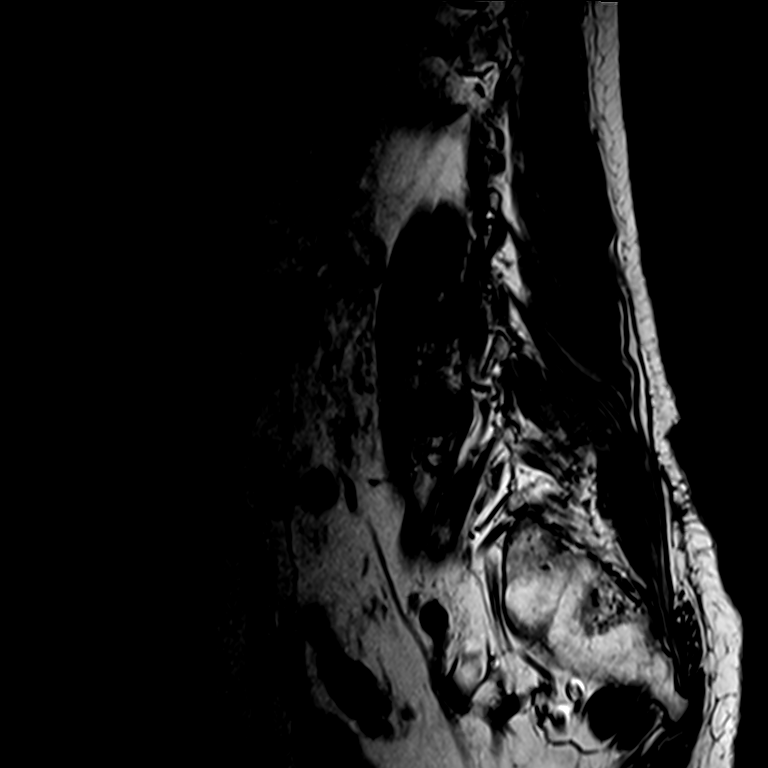

[Series 6: T2 · axial · 4.0mm · 0.25mm/px · z∈[-24,+146]mm · 3 of 46 slices shown (2 of 2)]
[im 7/46]
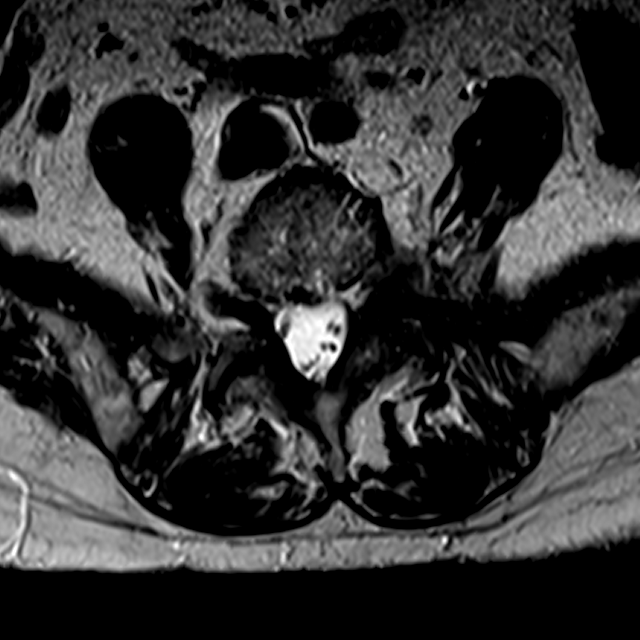
[im 25/46]
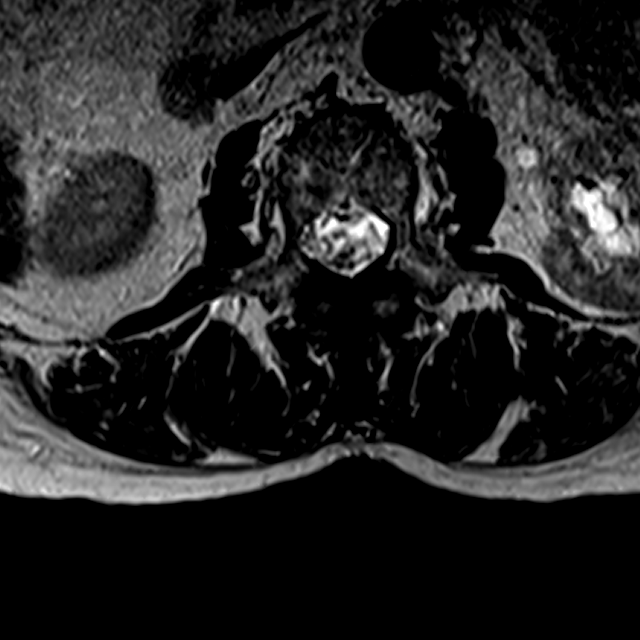
[im 40/46]
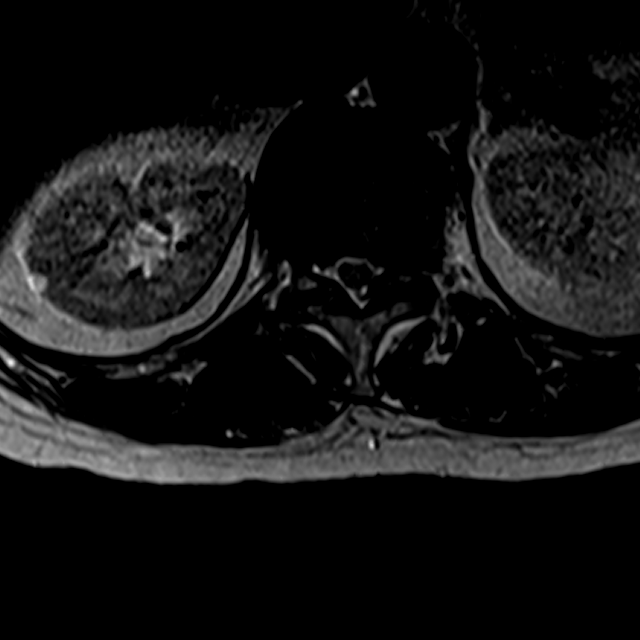

[Series 7: T1 · axial · 4.0mm · 0.25mm/px · z∈[-24,+146]mm · 3 of 46 slices shown (2 of 2)]
[im 7/46]
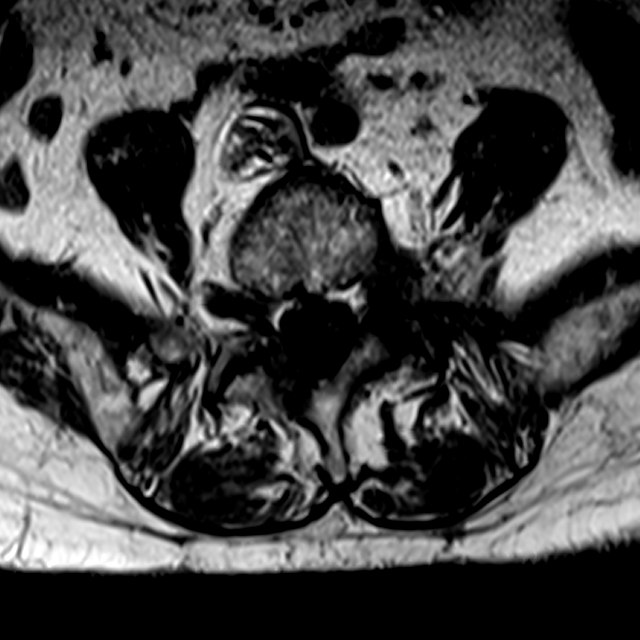
[im 25/46]
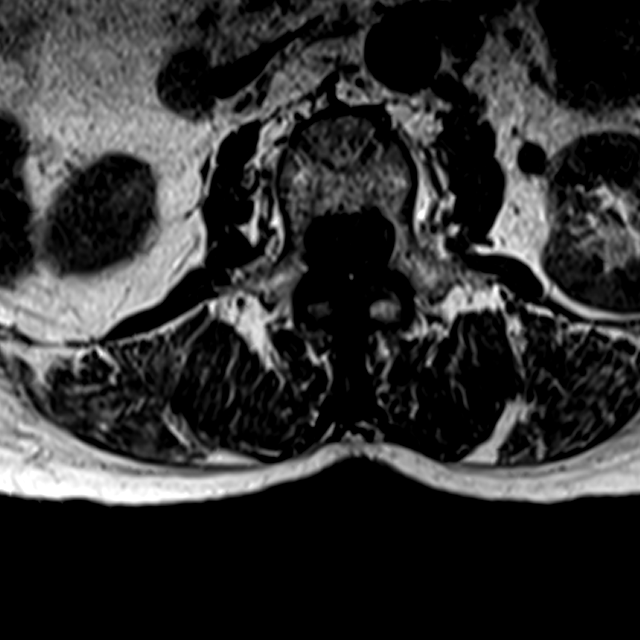
[im 40/46]
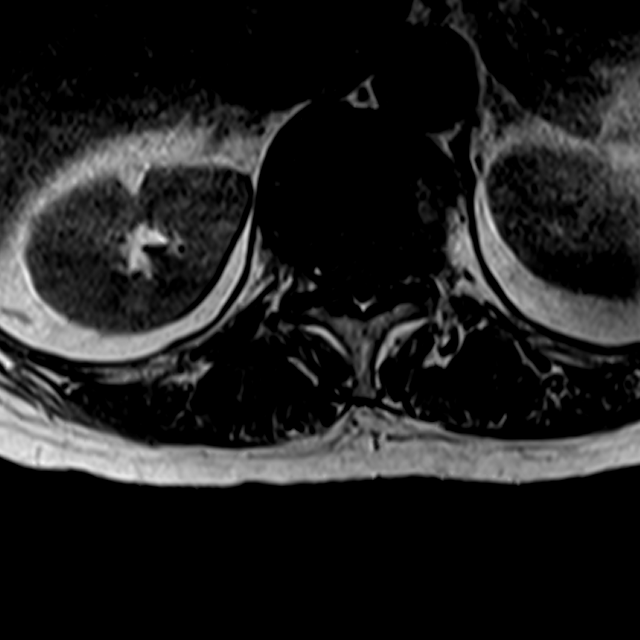

[14 of 48 positions shown; findings below may reference images not displayed]

FINDINGS: Segmentation:  Normal on the comparisons.

Alignment: Stable since 3063, straightening of lumbar lordosis. No
significant spondylolisthesis.

Vertebrae: Mild compression of the inferior L1 endplate is new since
3063 but chronic with no associated marrow edema. There is mild
degenerative endplate marrow edema on the right at L3-L4. No other
acute osseous abnormality identified. Intact visible sacrum and SI
joints.

Conus medullaris and cauda equina: Conus extends to the L1 level. No
lower spinal cord or conus signal abnormality.

Paraspinal and other soft tissues: Visible abdominal viscera appear
stable since 3063, including benign right renal lower pole cyst.
Diverticulosis of the sigmoid colon is visible. Negative visualized
posterior paraspinal soft tissues.

Disc levels:

T10-T11: Spinal stenosis with up to mild spinal cord mass effect
related to disc bulging and posterior element hypertrophy. Moderate
to severe bilateral T10 foraminal stenosis.

T11-T12: Mild spinal stenosis with no cord mass effect related to
disc bulging eccentric to the left and posterior element
hypertrophy. Moderate to severe left and mild to moderate right T11
foraminal stenosis.

T12-L1: Disc bulge and posterior element hypertrophy without
significant stenosis.

L1-L2: Circumferential disc bulge and posterior element hypertrophy
with mild spinal and left lateral recess stenosis. No mass effect on
the conus.

L2-L3: Moderate spinal stenosis related to disc bulging and
posterior element hypertrophy. Right greater than left lateral
recess stenosis (L3 nerve levels). Mild bilateral L2 foraminal
stenosis.

L3-L4: Moderate to severe spinal and right lateral recess stenosis
(right L4 nerve level) related to right eccentric disc bulge and
moderate to severe posterior element hypertrophy. Severe right L3
neural foraminal stenosis.

L4-L5: Mild spinal stenosis with mild to moderate left and moderate
to severe right lateral recess stenosis related to disc bulge with
superimposed broad-based central disc protrusion and posterior
element hypertrophy. Mild to moderate left and mild right L4
foraminal stenosis.

L5-S1: Mild to moderate left lateral recess stenosis related to disc
bulge, endplate spurring and posterior element hypertrophy. There is
severe left and moderate to severe right L5 foraminal stenosis.
IMPRESSION: 1. No acute osseous abnormality aside from degenerative endplate
marrow edema on the right at L3-L4. A mild L1 compression fracture
is new chronic.
2. Degenerative moderate to severe lumbar spinal and/or lateral
recess stenosis at L2-L3, L3-L4 and L4-L5.
3. Moderate to severe neural foraminal stenosis at the right L3 and
bilateral L5 nerve levels.
4. Mild lower thoracic spinal stenosis, and up to mild spinal cord
mass effect at T10-T11. No signal abnormality in the visible lower
thoracic spinal cord or conus.

## 2019-09-28 ENCOUNTER — Other Ambulatory Visit: Payer: Self-pay | Admitting: Nurse Practitioner

## 2019-09-28 DIAGNOSIS — K219 Gastro-esophageal reflux disease without esophagitis: Secondary | ICD-10-CM

## 2019-09-28 DIAGNOSIS — R1319 Other dysphagia: Secondary | ICD-10-CM

## 2019-11-08 ENCOUNTER — Encounter: Payer: Self-pay | Admitting: Internal Medicine

## 2019-12-02 DIAGNOSIS — M5416 Radiculopathy, lumbar region: Secondary | ICD-10-CM | POA: Diagnosis not present

## 2019-12-16 DIAGNOSIS — H1045 Other chronic allergic conjunctivitis: Secondary | ICD-10-CM | POA: Diagnosis not present

## 2019-12-16 DIAGNOSIS — H25811 Combined forms of age-related cataract, right eye: Secondary | ICD-10-CM | POA: Diagnosis not present

## 2019-12-16 DIAGNOSIS — H401122 Primary open-angle glaucoma, left eye, moderate stage: Secondary | ICD-10-CM | POA: Diagnosis not present

## 2019-12-17 DIAGNOSIS — J449 Chronic obstructive pulmonary disease, unspecified: Secondary | ICD-10-CM | POA: Diagnosis not present

## 2020-01-16 DIAGNOSIS — J449 Chronic obstructive pulmonary disease, unspecified: Secondary | ICD-10-CM | POA: Diagnosis not present

## 2020-02-03 DIAGNOSIS — N390 Urinary tract infection, site not specified: Secondary | ICD-10-CM | POA: Diagnosis not present

## 2020-02-16 DIAGNOSIS — J449 Chronic obstructive pulmonary disease, unspecified: Secondary | ICD-10-CM | POA: Diagnosis not present

## 2020-03-18 DIAGNOSIS — J449 Chronic obstructive pulmonary disease, unspecified: Secondary | ICD-10-CM | POA: Diagnosis not present

## 2020-04-06 ENCOUNTER — Other Ambulatory Visit (HOSPITAL_COMMUNITY): Payer: Self-pay | Admitting: Internal Medicine

## 2020-04-06 ENCOUNTER — Ambulatory Visit (HOSPITAL_COMMUNITY)
Admission: RE | Admit: 2020-04-06 | Discharge: 2020-04-06 | Disposition: A | Discharge status: Home / Self Care | Payer: Medicare Other | Source: Ambulatory Visit | Attending: Internal Medicine | Admitting: Internal Medicine

## 2020-04-06 ENCOUNTER — Other Ambulatory Visit: Payer: Self-pay

## 2020-04-06 DIAGNOSIS — M545 Low back pain, unspecified: Secondary | ICD-10-CM

## 2020-04-06 DIAGNOSIS — I10 Essential (primary) hypertension: Secondary | ICD-10-CM | POA: Diagnosis not present

## 2020-04-06 DIAGNOSIS — M5400 Panniculitis affecting regions of neck and back, site unspecified: Secondary | ICD-10-CM | POA: Diagnosis not present

## 2020-04-14 DIAGNOSIS — J449 Chronic obstructive pulmonary disease, unspecified: Secondary | ICD-10-CM | POA: Diagnosis not present

## 2020-05-16 DIAGNOSIS — J449 Chronic obstructive pulmonary disease, unspecified: Secondary | ICD-10-CM | POA: Diagnosis not present

## 2020-06-13 ENCOUNTER — Other Ambulatory Visit (HOSPITAL_COMMUNITY): Payer: Self-pay

## 2020-06-13 ENCOUNTER — Other Ambulatory Visit: Payer: Self-pay

## 2020-06-13 ENCOUNTER — Emergency Department (HOSPITAL_COMMUNITY)
Admission: EM | Admit: 2020-06-13 | Discharge: 2020-06-13 | Disposition: A | Discharge status: Home / Self Care | Payer: Medicare Other | Attending: Emergency Medicine | Admitting: Emergency Medicine

## 2020-06-13 ENCOUNTER — Encounter (HOSPITAL_COMMUNITY): Payer: Self-pay | Admitting: *Deleted

## 2020-06-13 ENCOUNTER — Emergency Department (HOSPITAL_COMMUNITY): Payer: Medicare Other

## 2020-06-13 DIAGNOSIS — U071 COVID-19: Secondary | ICD-10-CM | POA: Insufficient documentation

## 2020-06-13 DIAGNOSIS — Z79899 Other long term (current) drug therapy: Secondary | ICD-10-CM | POA: Diagnosis not present

## 2020-06-13 DIAGNOSIS — E119 Type 2 diabetes mellitus without complications: Secondary | ICD-10-CM | POA: Diagnosis not present

## 2020-06-13 DIAGNOSIS — J449 Chronic obstructive pulmonary disease, unspecified: Secondary | ICD-10-CM | POA: Diagnosis not present

## 2020-06-13 DIAGNOSIS — Z91010 Allergy to peanuts: Secondary | ICD-10-CM | POA: Diagnosis not present

## 2020-06-13 DIAGNOSIS — R0789 Other chest pain: Secondary | ICD-10-CM | POA: Diagnosis not present

## 2020-06-13 DIAGNOSIS — J4521 Mild intermittent asthma with (acute) exacerbation: Secondary | ICD-10-CM | POA: Diagnosis not present

## 2020-06-13 DIAGNOSIS — I10 Essential (primary) hypertension: Secondary | ICD-10-CM | POA: Insufficient documentation

## 2020-06-13 DIAGNOSIS — R059 Cough, unspecified: Secondary | ICD-10-CM | POA: Diagnosis not present

## 2020-06-13 DIAGNOSIS — R509 Fever, unspecified: Secondary | ICD-10-CM | POA: Diagnosis present

## 2020-06-13 DIAGNOSIS — R079 Chest pain, unspecified: Secondary | ICD-10-CM | POA: Diagnosis not present

## 2020-06-13 LAB — URINALYSIS, ROUTINE W REFLEX MICROSCOPIC
Bilirubin Urine: NEGATIVE
Glucose, UA: NEGATIVE mg/dL
Hgb urine dipstick: NEGATIVE
Ketones, ur: NEGATIVE mg/dL
Nitrite: NEGATIVE
Protein, ur: NEGATIVE mg/dL
Specific Gravity, Urine: 1.005 (ref 1.005–1.030)
pH: 7 (ref 5.0–8.0)

## 2020-06-13 LAB — LACTIC ACID, PLASMA: Lactic Acid, Venous: 1.3 mmol/L (ref 0.5–1.9)

## 2020-06-13 LAB — APTT: aPTT: 28 seconds (ref 24–36)

## 2020-06-13 LAB — CBC
HCT: 41.6 % (ref 36.0–46.0)
Hemoglobin: 12.8 g/dL (ref 12.0–15.0)
MCH: 22.9 pg — ABNORMAL LOW (ref 26.0–34.0)
MCHC: 30.8 g/dL (ref 30.0–36.0)
MCV: 74.3 fL — ABNORMAL LOW (ref 80.0–100.0)
Platelets: 274 10*3/uL (ref 150–400)
RBC: 5.6 MIL/uL — ABNORMAL HIGH (ref 3.87–5.11)
RDW: 18 % — ABNORMAL HIGH (ref 11.5–15.5)
WBC: 8.5 10*3/uL (ref 4.0–10.5)
nRBC: 0 % (ref 0.0–0.2)

## 2020-06-13 LAB — DIFFERENTIAL
Abs Immature Granulocytes: 0.02 10*3/uL (ref 0.00–0.07)
Basophils Absolute: 0 10*3/uL (ref 0.0–0.1)
Basophils Relative: 0 %
Eosinophils Absolute: 0.1 10*3/uL (ref 0.0–0.5)
Eosinophils Relative: 2 %
Immature Granulocytes: 0 %
Lymphocytes Relative: 8 %
Lymphs Abs: 0.7 10*3/uL (ref 0.7–4.0)
Monocytes Absolute: 0.8 10*3/uL (ref 0.1–1.0)
Monocytes Relative: 10 %
Neutro Abs: 6.6 10*3/uL (ref 1.7–7.7)
Neutrophils Relative %: 80 %

## 2020-06-13 LAB — BASIC METABOLIC PANEL
Anion gap: 9 (ref 5–15)
BUN: 16 mg/dL (ref 8–23)
CO2: 28 mmol/L (ref 22–32)
Calcium: 9.3 mg/dL (ref 8.9–10.3)
Chloride: 98 mmol/L (ref 98–111)
Creatinine, Ser: 1 mg/dL (ref 0.44–1.00)
GFR, Estimated: 54 mL/min — ABNORMAL LOW (ref >60)
Glucose, Bld: 131 mg/dL — ABNORMAL HIGH (ref 70–99)
Potassium: 3.5 mmol/L (ref 3.5–5.1)
Sodium: 135 mmol/L (ref 135–145)

## 2020-06-13 LAB — RESP PANEL BY RT-PCR (FLU A&B, COVID) ARPGX2
Influenza A by PCR: NEGATIVE
Influenza B by PCR: NEGATIVE
SARS Coronavirus 2 by RT PCR: POSITIVE — AB

## 2020-06-13 LAB — HEPATIC FUNCTION PANEL
ALT: 13 U/L (ref 0–44)
AST: 24 U/L (ref 15–41)
Albumin: 3.7 g/dL (ref 3.5–5.0)
Alkaline Phosphatase: 50 U/L (ref 38–126)
Bilirubin, Direct: 0.2 mg/dL (ref 0.0–0.2)
Indirect Bilirubin: 1.1 mg/dL — ABNORMAL HIGH (ref 0.3–0.9)
Total Bilirubin: 1.3 mg/dL — ABNORMAL HIGH (ref 0.3–1.2)
Total Protein: 7.3 g/dL (ref 6.5–8.1)

## 2020-06-13 LAB — PROTIME-INR
INR: 1.1 (ref 0.8–1.2)
Prothrombin Time: 14.1 seconds (ref 11.4–15.2)

## 2020-06-13 LAB — TROPONIN I (HIGH SENSITIVITY)
Troponin I (High Sensitivity): 20 ng/L — ABNORMAL HIGH (ref <18)
Troponin I (High Sensitivity): 17 ng/L (ref <18)

## 2020-06-13 MED ORDER — LACTATED RINGERS IV BOLUS
1000.0000 mL | Freq: Once | INTRAVENOUS | Status: AC
  Administered 2020-06-13: 1000 mL via INTRAVENOUS

## 2020-06-13 MED ORDER — ACETAMINOPHEN 325 MG PO TABS
650.0000 mg | ORAL_TABLET | Freq: Once | ORAL | Status: AC
  Administered 2020-06-13: 650 mg via ORAL
  Filled 2020-06-13: qty 2

## 2020-06-13 MED ORDER — SODIUM CHLORIDE 0.9 % IV BOLUS: 500.0000 mL | Freq: Once | INTRAVENOUS | Status: DC

## 2020-06-13 MED ORDER — NIRMATRELVIR/RITONAVIR (PAXLOVID) TABLET (RENAL DOSING): 2.0000 | ORAL_TABLET | Freq: Two times a day (BID) | ORAL | 0 refills | Status: DC

## 2020-06-13 MED ORDER — BEBTELOVIMAB 175 MG/2 ML IV (EUA)
175.0000 mg | Freq: Once | INTRAMUSCULAR | Status: AC
  Administered 2020-06-13: 175 mg via INTRAVENOUS
  Filled 2020-06-13: qty 2

## 2020-06-13 NOTE — ED Notes (Signed)
Pt's son Sheila Myers will contact her caregiver who will take her home.

## 2020-06-13 NOTE — ED Triage Notes (Signed)
States she woke up in the middle of the night and had started coughing, states she has pain in her chest due to cough

## 2020-06-13 NOTE — Discharge Instructions (Addendum)
You can continue to take tylenol as needed for management of your body aches and fevers.  Please continue to monitor your symptoms closely.  If you develop any new or worsening symptoms, please come back to the emergency department immediately for reevaluation.  It was a pleasure to meet you.

## 2020-06-13 NOTE — ED Notes (Signed)
Received lab notified that pt is covid+. PA notified.

## 2020-06-13 NOTE — ED Notes (Signed)
Waiting for medication from pharmacy.

## 2020-06-13 NOTE — ED Provider Notes (Signed)
[facility]ENCY DEPARTMENT Provider Note   CSN: [id number] Arrival date & time: 06/13/20  1305     History Chief Complaint  Patient presents with  . Fever    Sheila Myers is a 87 y.o. female.  HPI Patient is an 87 year old female with a history of TIA, hyperlipidemia, hypertension, diabetes mellitus, COPD, who presents to the emergency department due to a dry cough that started about 12 hours ago.  She states she woke with her cough.  Reports associated wheezing.  She states she gave herself multiple nebulizer treatments at home with minimal relief.  She reports continued cough as well as wheezing since arriving to the emergency department.  Also complains of diffuse chest tightness that worsens with coughing.  No abdominal pain, nausea, vomiting, or diarrhea.  No rhinorrhea or sore throat.  She has been vaccinated for COVID-19 x2.    Past Medical History:  Diagnosis Date  . Anxiety   . ASCVD (arteriosclerotic cardiovascular disease)    H/o PCI  . Asthma   . Chronic back pain   . Chronic obstructive pulmonary disease (HCC)   . Degenerative joint disease    Right shoulder pain-mechanism unclear  . Depression   . Diabetes mellitus type II   . Gastroesophageal reflux disease    Diverticulosis, hemorrhoids with rectal bleeding,  . Glaucoma   . Glaucoma   . Hyperlipidemia   . Hypertension   . Irritable bowel syndrome    constipation alternating with diarrhea  . Stroke [facility](HCC) 2017   TIA    Patient Active Problem List   Diagnosis Date Noted  . Glaucoma 04/05/2019  . Osteoporosis 04/05/2019  . Mild intermittent asthma with acute exacerbation 04/05/2019  . Esophageal dysphagia 06/19/2017  . Closed compression fracture of L1 lumbar vertebra, with routine healing, subsequent encounter 07/03/2016  . Chest pain 12/07/2015  . Chest pain at rest 12/07/2015  . TIA (transient ischemic attack) 08/04/2015  . Slurred speech 08/04/2015  . Accelerated hypertension  08/04/2015  . Right arm pain 08/04/2015  . Cerebrovascular disease 09/28/2011  . Thyroid nodule 09/28/2011  . Irritable bowel syndrome   . Gastroesophageal reflux disease   . Arteriosclerotic cardiovascular disease (ASCVD)   . Diabetes mellitus, type II (HCC)   . Secondary hypertension   . Hyperlipidemia   . Pulmonary emphysema (HCC) 05/10/2008  . BACK PAIN, CHRONIC 05/10/2008  . ABDOMINAL PAIN-EPIGASTRIC 03/31/2008    Past Surgical History:  Procedure Laterality Date  . ABDOMINAL HYSTERECTOMY    . APPENDECTOMY    . CHOLECYSTECTOMY    . COLONOSCOPY WITH ESOPHAGOGASTRODUODENOSCOPY (EGD)  2007   Dr. Darrick PennaFields: diverticulosis, external hemorrhoids, hiatal hernia, patent Schatzki ring.  . CORONARY ANGIOPLASTY WITH STENT PLACEMENT  1991  . ESOPHAGOGASTRODUODENOSCOPY  2012   Dr. Darrick Pennafields: Peptic stricture versus ring status post dilation, gastric inflammation and polyps,  biopsies unremarkable.  . ESOPHAGOGASTRODUODENOSCOPY (EGD) WITH PROPOFOL N/A 08/19/2017   Procedure: ESOPHAGOGASTRODUODENOSCOPY (EGD) WITH PROPOFOL;  Surgeon: West BaliFields, Sandi L, MD;  Location: [facility];  Service: Endoscopy;  Laterality: N/A;  2:15pm  . SAVORY DILATION N/A 08/19/2017   Procedure: SAVORY DILATION;  Surgeon: West BaliFields, Sandi L, MD;  Location: [facility] ENDO SUITE;  Service: Endoscopy;  Laterality: N/A;     OB History   No obstetric history on file.     Family History  Problem Relation Age of Onset  . Aneurysm Mother        Cerebral  . Hypertension Mother   . Breast cancer Sister   .  Breast cancer Sister   . Colon cancer Neg Hx   . Colon polyps Neg Hx     Social History   Tobacco Use  . Smoking status: Never Smoker  . Smokeless tobacco: Never Used  Vaping Use  . Vaping Use: Never used  Substance Use Topics  . Alcohol use: No  . Drug use: No    Home Medications Prior to Admission medications   Medication Sig Start Date End Date Taking? Authorizing Provider  albuterol (VENTOLIN HFA) 108 (90 Base)  MCG/ACT inhaler Inhale 1-2 puffs into the lungs every 6 (six) hours as needed for wheezing or shortness of breath. 03/31/19  Yes Avegno, Zachery Dakins, FNP  alendronate (FOSAMAX) 70 MG tablet Take 70 mg by mouth once a week. 08/15/16  Yes [provider]  amLODipine-benazepril (LOTREL) 10-20 MG capsule Take 1 capsule by mouth daily. 10/14/18  Yes [provider]  atorvastatin (LIPITOR) 20 MG tablet Take 1 tablet by mouth at bedtime. 04/04/20  Yes [provider]  Fluticasone-Salmeterol (ADVAIR DISKUS) 250-50 MCG/DOSE AEPB Inhale 1 puff into the lungs 2 (two) times daily. 04/05/19  Yes Corum, Minerva Fester, MD  HYDROcodone-acetaminophen (NORCO) 10-325 MG tablet Take 1 tablet by mouth every 6 (six) hours as needed for pain. 07/30/17  Yes [provider]  NEXIUM 40 MG capsule Take 1 capsule (40 mg total) by mouth daily before breakfast. 09/30/19  Yes Tiffany Kocher, PA-C  nitrofurantoin (MACRODANTIN) 100 MG capsule Take 100 mg by mouth 2 (two) times daily.   Yes [provider]  pantoprazole (PROTONIX) 40 MG tablet Take 40 mg by mouth 2 (two) times daily.   Yes [provider]  bimatoprost (LUMIGAN) 0.03 % ophthalmic drops Place 1 drop into both eyes at bedtime.   Patient not taking: Reported on 06/13/2020    [provider]  loratadine (CLARITIN) 10 MG tablet Take 1 tablet (10 mg total) by mouth daily. Patient not taking: Reported on 06/13/2020 03/31/19   Durward Parcel, FNP  predniSONE (DELTASONE) 10 MG tablet Take 2 tablets (20 mg total) by mouth daily. Patient not taking: No sig reported 03/31/19   Durward Parcel, FNP  traZODone (DESYREL) 50 MG tablet Take 50 mg by mouth at bedtime. Patient not taking: Reported on 06/13/2020 08/04/17   [provider]  Vitamin D, Ergocalciferol, (DRISDOL) 1.25 MG (50000 UNIT) CAPS capsule Take 1 capsule (50,000 Units total) by mouth every 7 (seven) days. 06/10/19   Wandra Feinstein, MD    Allergies     Peanut-containing drug products, Catapres [clonidine hcl], Codeine, and Morphine  Review of Systems   Review of Systems  All other systems reviewed and are negative. Ten systems reviewed and are negative for acute change, except as noted in the HPI.   Physical Exam Updated Vital Signs BP (!) 192/70   Pulse 81   Temp 98 F (36.7 C) (Oral)   Resp (!) 25   SpO2 96%   Physical Exam Vitals and nursing note reviewed.  Constitutional:      General: She is not in acute distress.    Appearance: Normal appearance. She is normal weight. She is not ill-appearing, toxic-appearing or diaphoretic.  HENT:     Head: Normocephalic and atraumatic.     Right Ear: External ear normal.     Left Ear: External ear normal.     Nose: Nose normal.     Mouth/Throat:     Mouth: Mucous membranes are moist.  Pharynx: Oropharynx is clear. No oropharyngeal exudate or posterior oropharyngeal erythema.  Eyes:     General: No scleral icterus.       Right eye: No discharge.        Left eye: No discharge.     Extraocular Movements: Extraocular movements intact.     Conjunctiva/sclera: Conjunctivae normal.  Cardiovascular:     Rate and Rhythm: Normal rate and regular rhythm.     Pulses: Normal pulses.     Heart sounds: Normal heart sounds. No murmur heard. No friction rub. No gallop.   Pulmonary:     Effort: Pulmonary effort is normal. No respiratory distress.     Breath sounds: Normal breath sounds. No stridor. No wheezing, rhonchi or rales.     Comments: LCTAB. Abdominal:     General: Abdomen is flat.     Palpations: Abdomen is soft.     Tenderness: There is no abdominal tenderness.     Comments: Abdomen is flat, soft, and nontender.  Musculoskeletal:        General: Normal range of motion.     Cervical back: Normal range of motion and neck supple. No tenderness.     Right lower leg: No edema.     Left lower leg: No edema.  Skin:    General: Skin is warm and dry.  Neurological:     General:  No focal deficit present.     Mental Status: She is alert and oriented to person, place, and time.  Psychiatric:        Mood and Affect: Mood normal.        Behavior: Behavior normal.    ED Results / Procedures / Treatments   Labs (all labs ordered are listed, but only abnormal results are displayed) Labs Reviewed  RESP PANEL BY RT-PCR (FLU A&B, COVID) ARPGX2 - Abnormal; Notable for the following components:      Result Value   SARS Coronavirus 2 by RT PCR POSITIVE (*)    All other components within normal limits  BASIC METABOLIC PANEL - Abnormal; Notable for the following components:   Glucose, Bld 131 (*)    GFR, Estimated 54 (*)    All other components within normal limits  CBC - Abnormal; Notable for the following components:   RBC 5.60 (*)    MCV 74.3 (*)    MCH 22.9 (*)    RDW 18.0 (*)    All other components within normal limits  URINALYSIS, ROUTINE W REFLEX MICROSCOPIC - Abnormal; Notable for the following components:   Leukocytes,Ua TRACE (*)    Bacteria, UA RARE (*)    All other components within normal limits  HEPATIC FUNCTION PANEL - Abnormal; Notable for the following components:   Total Bilirubin 1.3 (*)    Indirect Bilirubin 1.1 (*)    All other components within normal limits  TROPONIN I (HIGH SENSITIVITY) - Abnormal; Notable for the following components:   Troponin I (High Sensitivity) 20 (*)    All other components within normal limits  CULTURE, BLOOD (ROUTINE X 2)  CULTURE, BLOOD (ROUTINE X 2)  URINE CULTURE  LACTIC ACID, PLASMA  DIFFERENTIAL  PROTIME-INR  APTT  LACTIC ACID, PLASMA  TROPONIN I (HIGH SENSITIVITY)    EKG EKG Interpretation  Date/Time:  Tuesday Jun 13 2020 13:27:53 EDT Ventricular Rate:  90 PR Interval:  160 QRS Duration: 82 QT Interval:  368 QTC Calculation: 450 R Axis:   17 Text Interpretation: Normal sinus rhythm Cannot rule out Anterior infarct ,  age undetermined Abnormal ECG No significant change since prior 7/19 Confirmed  by Meridee Score [phone]) on 06/13/2020 1:30:58 PM   Radiology DG Chest 2 View  Result Date: 06/13/2020 CLINICAL DATA:  Acute onset of cough last night.  Chest pain. EXAM: CHEST - 2 VIEW COMPARISON:  Radiographs 03/31/2019 and 03/02/2019. FINDINGS: The heart size and mediastinal contours are stable. There is aortic atherosclerosis and tortuosity. Previously demonstrated patchy airspace opacities in the right upper lobe and lingula have resolved. The lungs now appear clear. There is no pleural effusion or pneumothorax. The bones are unchanged. IMPRESSION: No acute cardiopulmonary process.  Aortic atherosclerosis. Electronically Signed   By: Carey Bullocks M.D.   On: 06/13/2020 14:05   Procedures Procedures   Medications Ordered in ED Medications  bebtelovimab EUA injection SOLN 175 mg (has no administration in time range)  lactated ringers bolus 1,000 mL (1,000 mLs Intravenous New Bag/Given 06/13/20 1453)  acetaminophen (TYLENOL) tablet 650 mg (650 mg Oral Given 06/13/20 1454)    ED Course  I have reviewed the triage vital signs and the nursing notes.  Pertinent labs & imaging results that were available during my care of the patient were reviewed by me and considered in my medical decision making (see chart for details).  Clinical Course as of 06/13/20 1950  Tue Jun 13, 2020  1413 Temp(!): 100.4 F (38 C) Rectal [LJ]  1453 She is coming from home for evaluation of fever cough and some chest pain that started yesterday.  She said she had a headache also but that has improved.  He is getting a fever work-up with labs urinalysis chest x-ray COVID testing.  Getting IV fluids.  Disposition per results of testing. [MB]    Clinical Course User Index [LJ] Placido Sou, PA-C [MB] Terrilee Files, MD   MDM Rules/Calculators/A&P                          Pt is a 87 y.o. female who presents to the ED with sx related to COVID-19.  Labs: CBC with RBCs of 5.6, MCV of 74.3, MCH of 22.9,  RDW of 18. BMP with a glucose of 131 and a GFR 54. Respiratory panel is positive for COVID-19. PT/INR within normal limits. Troponin of 20 with a repeat of 17. UA showing trace leukocytes and rare bacteria. Blood cultures obtained. Urine culture obtained.  Imaging: Chest x-ray shows no acute cardiopulmonary process.  ECG: Shows no significant change since prior tracing  I, Placido Sou, PA-C, personally reviewed and evaluated these images and lab results as part of my medical decision-making.  Patient's symptoms today are likely secondary to COVID-19.  She woke up with coughing last night as well as wheezing and chest tightness.  She does have a history of COPD.  She gave herself multiple nebulizers at home with minimal relief so she came to the emergency department.  Patient has a generally reassuring work-up.  No leukocytosis on CBC.  Chest x-ray negative for any acute cardiopulmonary processes.  Reassuring troponins.  No elevation in lactic acid.  Initially febrile at 100.4 F rectally.  She was given Tylenol which resolved her fever.  She has had no episodes of hypoxia since coming to the emergency department.  She was ambulated with a pulse ox and maintained oxygen saturations in the high 90s.   Given patient's age and comorbidities feel that she is a good candidate for bebtelovimab treatment in the emergency department.  This  was discussed with the patient and she is amenable. This was given and pt was monitored in the ED afterwards with no adverse events.   Feel the patient is stable for discharge at this time and she is agreeable.  She was given very strict return precautions and knows that if she develops any new or worsening symptoms that she needs to come back to the emergency department immediately for reevaluation.  Her questions were answered and she was amicable at the time of discharge.  Patient discussed with and evaluated by my attending physician who is in agreement  with the above plan.  Note: Portions of this report may have been transcribed using voice recognition software. Every effort was made to ensure accuracy; however, inadvertent computerized transcription errors may be present.   Final Clinical Impression(s) / ED Diagnoses Final diagnoses:  COVID-19   Rx / DC Orders ED Discharge Orders         Ordered    nirmatrelvir/ritonavir EUA, renal dosing, (PAXLOVID) TABS  2 times daily,   Status:  Discontinued        06/13/20 1844           Placido Sou, PA-C 06/13/20 1950    Terrilee Files, MD 06/14/20 [phone]

## 2020-06-13 NOTE — ED Provider Notes (Signed)
  Face-to-face evaluation   History: She presents for illness for 3 days consisting of cough, and decreased appetite.  She has had COVID vaccines and booster.  She denies focal weakness paresthesia.  She lives with her son, locally.  She has not been vomiting.  She denies diarrhea.  Physical exam: Alert elderly female who appears comfortable.  No respiratory distress.  Normal pulse, left radial artery by me.  No peripheral edema.  No abdominal discomfort.  Medical screening examination/treatment/procedure(s) were conducted as a shared visit with non-physician practitioner(s) and myself.  I personally evaluated the patient during the encounter    Mancel Bale, MD 06/16/20 [phone]

## 2020-06-15 DIAGNOSIS — J449 Chronic obstructive pulmonary disease, unspecified: Secondary | ICD-10-CM | POA: Diagnosis not present

## 2020-06-15 LAB — URINE CULTURE

## 2020-06-18 LAB — CULTURE, BLOOD (ROUTINE X 2)
Culture: NO GROWTH
Culture: NO GROWTH
Special Requests: ADEQUATE
Special Requests: ADEQUATE

## 2020-07-04 DIAGNOSIS — I10 Essential (primary) hypertension: Secondary | ICD-10-CM | POA: Diagnosis not present

## 2020-07-04 DIAGNOSIS — E785 Hyperlipidemia, unspecified: Secondary | ICD-10-CM | POA: Diagnosis not present

## 2020-07-04 DIAGNOSIS — E1159 Type 2 diabetes mellitus with other circulatory complications: Secondary | ICD-10-CM | POA: Diagnosis not present

## 2020-07-04 DIAGNOSIS — E876 Hypokalemia: Secondary | ICD-10-CM | POA: Diagnosis not present

## 2020-07-14 DIAGNOSIS — J449 Chronic obstructive pulmonary disease, unspecified: Secondary | ICD-10-CM | POA: Diagnosis not present

## 2020-08-11 DIAGNOSIS — J449 Chronic obstructive pulmonary disease, unspecified: Secondary | ICD-10-CM | POA: Diagnosis not present

## 2020-08-25 DIAGNOSIS — E119 Type 2 diabetes mellitus without complications: Secondary | ICD-10-CM | POA: Diagnosis not present

## 2020-08-25 DIAGNOSIS — J449 Chronic obstructive pulmonary disease, unspecified: Secondary | ICD-10-CM | POA: Diagnosis not present

## 2020-08-25 DIAGNOSIS — E785 Hyperlipidemia, unspecified: Secondary | ICD-10-CM | POA: Diagnosis not present

## 2020-08-25 DIAGNOSIS — U099 Post covid-19 condition, unspecified: Secondary | ICD-10-CM | POA: Diagnosis not present

## 2020-08-25 DIAGNOSIS — I10 Essential (primary) hypertension: Secondary | ICD-10-CM | POA: Diagnosis not present

## 2020-08-25 DIAGNOSIS — E1100 Type 2 diabetes mellitus with hyperosmolarity without nonketotic hyperglycemic-hyperosmolar coma (NKHHC): Secondary | ICD-10-CM | POA: Diagnosis not present

## 2020-09-11 DIAGNOSIS — J449 Chronic obstructive pulmonary disease, unspecified: Secondary | ICD-10-CM | POA: Diagnosis not present

## 2020-10-12 DIAGNOSIS — J449 Chronic obstructive pulmonary disease, unspecified: Secondary | ICD-10-CM | POA: Diagnosis not present

## 2020-11-10 DIAGNOSIS — J449 Chronic obstructive pulmonary disease, unspecified: Secondary | ICD-10-CM | POA: Diagnosis not present

## 2020-12-11 DIAGNOSIS — J449 Chronic obstructive pulmonary disease, unspecified: Secondary | ICD-10-CM | POA: Diagnosis not present

## 2020-12-16 ENCOUNTER — Other Ambulatory Visit: Payer: Self-pay | Admitting: Gastroenterology

## 2020-12-16 DIAGNOSIS — K219 Gastro-esophageal reflux disease without esophagitis: Secondary | ICD-10-CM

## 2020-12-16 DIAGNOSIS — R1319 Other dysphagia: Secondary | ICD-10-CM

## 2020-12-18 ENCOUNTER — Encounter: Payer: Self-pay | Admitting: Internal Medicine

## 2020-12-18 NOTE — Telephone Encounter (Signed)
Patient has not been seen since 2020.  She needs an office visit for additional refills or she can receive this medication from her primary care doctor.  Stacey: Please arrange follow-up for medication refills if patient desires an office visit.

## 2020-12-18 NOTE — Telephone Encounter (Signed)
Noted  

## 2020-12-18 NOTE — Telephone Encounter (Signed)
Spoke with pt. Informed her that she would need an OV to have esomeprazole refilled or she could see PCP. Pt stated she would try and get in to see PCP.

## 2021-01-10 DIAGNOSIS — J449 Chronic obstructive pulmonary disease, unspecified: Secondary | ICD-10-CM | POA: Diagnosis not present

## 2021-02-07 DIAGNOSIS — R7309 Other abnormal glucose: Secondary | ICD-10-CM | POA: Diagnosis not present

## 2021-02-07 DIAGNOSIS — M545 Low back pain, unspecified: Secondary | ICD-10-CM | POA: Diagnosis not present

## 2021-02-07 DIAGNOSIS — I10 Essential (primary) hypertension: Secondary | ICD-10-CM | POA: Diagnosis not present

## 2021-02-07 DIAGNOSIS — Z23 Encounter for immunization: Secondary | ICD-10-CM | POA: Diagnosis not present

## 2021-02-07 DIAGNOSIS — E1100 Type 2 diabetes mellitus with hyperosmolarity without nonketotic hyperglycemic-hyperosmolar coma (NKHHC): Secondary | ICD-10-CM | POA: Diagnosis not present

## 2021-02-13 DIAGNOSIS — J449 Chronic obstructive pulmonary disease, unspecified: Secondary | ICD-10-CM | POA: Diagnosis not present

## 2021-03-16 DIAGNOSIS — J449 Chronic obstructive pulmonary disease, unspecified: Secondary | ICD-10-CM | POA: Diagnosis not present

## 2021-04-13 DIAGNOSIS — J449 Chronic obstructive pulmonary disease, unspecified: Secondary | ICD-10-CM | POA: Diagnosis not present

## 2021-04-19 DIAGNOSIS — I10 Essential (primary) hypertension: Secondary | ICD-10-CM | POA: Diagnosis not present

## 2021-04-19 DIAGNOSIS — Z0001 Encounter for general adult medical examination with abnormal findings: Secondary | ICD-10-CM | POA: Diagnosis not present

## 2021-04-19 DIAGNOSIS — E119 Type 2 diabetes mellitus without complications: Secondary | ICD-10-CM | POA: Diagnosis not present

## 2021-05-09 DIAGNOSIS — E1129 Type 2 diabetes mellitus with other diabetic kidney complication: Secondary | ICD-10-CM | POA: Diagnosis not present

## 2021-05-09 DIAGNOSIS — I10 Essential (primary) hypertension: Secondary | ICD-10-CM | POA: Diagnosis not present

## 2021-05-09 DIAGNOSIS — Z79899 Other long term (current) drug therapy: Secondary | ICD-10-CM | POA: Diagnosis not present

## 2021-05-09 DIAGNOSIS — I2510 Atherosclerotic heart disease of native coronary artery without angina pectoris: Secondary | ICD-10-CM | POA: Diagnosis not present

## 2021-05-10 DIAGNOSIS — E119 Type 2 diabetes mellitus without complications: Secondary | ICD-10-CM | POA: Diagnosis not present

## 2021-05-10 DIAGNOSIS — Z961 Presence of intraocular lens: Secondary | ICD-10-CM | POA: Diagnosis not present

## 2021-05-10 DIAGNOSIS — H25811 Combined forms of age-related cataract, right eye: Secondary | ICD-10-CM | POA: Diagnosis not present

## 2021-05-10 DIAGNOSIS — H26492 Other secondary cataract, left eye: Secondary | ICD-10-CM | POA: Diagnosis not present

## 2021-05-10 DIAGNOSIS — H401132 Primary open-angle glaucoma, bilateral, moderate stage: Secondary | ICD-10-CM | POA: Diagnosis not present

## 2021-05-14 DIAGNOSIS — J449 Chronic obstructive pulmonary disease, unspecified: Secondary | ICD-10-CM | POA: Diagnosis not present

## 2021-05-22 DIAGNOSIS — E1122 Type 2 diabetes mellitus with diabetic chronic kidney disease: Secondary | ICD-10-CM | POA: Diagnosis not present

## 2021-05-22 DIAGNOSIS — M545 Low back pain, unspecified: Secondary | ICD-10-CM | POA: Diagnosis not present

## 2021-05-22 DIAGNOSIS — I10 Essential (primary) hypertension: Secondary | ICD-10-CM | POA: Diagnosis not present

## 2021-05-22 DIAGNOSIS — E785 Hyperlipidemia, unspecified: Secondary | ICD-10-CM | POA: Diagnosis not present

## 2021-05-23 ENCOUNTER — Encounter: Payer: Self-pay | Admitting: Orthopedic Surgery

## 2021-06-12 DIAGNOSIS — J449 Chronic obstructive pulmonary disease, unspecified: Secondary | ICD-10-CM | POA: Diagnosis not present

## 2021-07-11 DIAGNOSIS — J449 Chronic obstructive pulmonary disease, unspecified: Secondary | ICD-10-CM | POA: Diagnosis not present

## 2021-07-17 ENCOUNTER — Other Ambulatory Visit: Payer: Self-pay | Admitting: Orthopaedic Surgery

## 2021-07-17 ENCOUNTER — Ambulatory Visit: Payer: Medicare Other | Admitting: Orthopaedic Surgery

## 2021-07-17 ENCOUNTER — Ambulatory Visit (INDEPENDENT_AMBULATORY_CARE_PROVIDER_SITE_OTHER): Payer: Medicare Other | Admitting: Orthopaedic Surgery

## 2021-07-17 ENCOUNTER — Encounter: Payer: Self-pay | Admitting: Orthopaedic Surgery

## 2021-07-17 ENCOUNTER — Ambulatory Visit (INDEPENDENT_AMBULATORY_CARE_PROVIDER_SITE_OTHER): Payer: Medicare Other

## 2021-07-17 VITALS — BP 216/103 | HR 79 | Ht 63.0 in | Wt 116.4 lb

## 2021-07-17 DIAGNOSIS — M5441 Lumbago with sciatica, right side: Secondary | ICD-10-CM

## 2021-07-17 DIAGNOSIS — G8929 Other chronic pain: Secondary | ICD-10-CM | POA: Diagnosis not present

## 2021-07-17 NOTE — Progress Notes (Signed)
Subjective:    Patient ID: Sheila Myers, female    DOB: [birth year], 88 y.o.   MRN: [id number]  HPI She has developed pain in the lower back, more on the right side and with right sided paresthesias past the right knee.  She has not fallen.  She has no weakness, no bowel or bladder problem.  She has seen Dr. Ouida Sills and is to see him again today.  I have reviewed the notes.   Review of Systems  Constitutional:  Positive for activity change.  Respiratory:  Positive for shortness of breath and wheezing.   Musculoskeletal:  Positive for arthralgias, back pain and myalgias.  All other systems reviewed and are negative. For Review of Systems, all other systems reviewed and are negative.  The following is a summary of the past history medically, past history surgically, known current medicines, social history and family history.  This information is gathered electronically by the computer from prior information and documentation.  I review this each visit and have found including this information at this point in the chart is beneficial and informative.   Past Medical History:  Diagnosis Date   Anxiety    ASCVD (arteriosclerotic cardiovascular disease)    H/o PCI   Asthma    Chronic back pain    Chronic obstructive pulmonary disease (HCC)    Degenerative joint disease    Right shoulder pain-mechanism unclear   Depression    Diabetes mellitus type II    Gastroesophageal reflux disease    Diverticulosis, hemorrhoids with rectal bleeding,   Glaucoma    Glaucoma    Hyperlipidemia    Hypertension    Irritable bowel syndrome    constipation alternating with diarrhea   Stroke (HCC) 2017   TIA    Past Surgical History:  Procedure Laterality Date   ABDOMINAL HYSTERECTOMY     APPENDECTOMY     CHOLECYSTECTOMY     COLONOSCOPY WITH ESOPHAGOGASTRODUODENOSCOPY (EGD)  2007   Dr. Darrick Penna: diverticulosis, external hemorrhoids, hiatal hernia, patent Schatzki ring.   CORONARY ANGIOPLASTY  WITH STENT PLACEMENT  1991   ESOPHAGOGASTRODUODENOSCOPY  2012   Dr. Darrick Penna: Peptic stricture versus ring status post dilation, gastric inflammation and polyps,  biopsies unremarkable.   ESOPHAGOGASTRODUODENOSCOPY (EGD) WITH PROPOFOL N/A 08/19/2017   Procedure: ESOPHAGOGASTRODUODENOSCOPY (EGD) WITH PROPOFOL;  Surgeon: West Bali, MD;  Location: [facility];  Service: Endoscopy;  Laterality: N/A;  2:15pm   SAVORY DILATION N/A 08/19/2017   Procedure: SAVORY DILATION;  Surgeon: West Bali, MD;  Location: [facility];  Service: Endoscopy;  Laterality: N/A;    Current Outpatient Medications on File Prior to Visit  Medication Sig Dispense Refill   albuterol (VENTOLIN HFA) 108 (90 Base) MCG/ACT inhaler Inhale 1-2 puffs into the lungs every 6 (six) hours as needed for wheezing or shortness of breath. 18 g 0   alendronate (FOSAMAX) 70 MG tablet Take 70 mg by mouth once a week.  10   amLODipine-benazepril (LOTREL) 10-20 MG capsule Take 1 capsule by mouth daily.     atorvastatin (LIPITOR) 20 MG tablet Take 1 tablet by mouth at bedtime.     bimatoprost (LUMIGAN) 0.03 % ophthalmic drops Place 1 drop into both eyes at bedtime.   (Patient not taking: Reported on 06/13/2020)     Fluticasone-Salmeterol (ADVAIR DISKUS) 250-50 MCG/DOSE AEPB Inhale 1 puff into the lungs 2 (two) times daily. 1 each 3   HYDROcodone-acetaminophen (NORCO) 10-325 MG tablet Take 1 tablet by mouth every 6 (  six) hours as needed for pain. (Patient not taking: Reported on 07/17/2021)  0   loratadine (CLARITIN) 10 MG tablet Take 1 tablet (10 mg total) by mouth daily. (Patient not taking: Reported on 06/13/2020) 30 tablet 0   NEXIUM 40 MG capsule Take 1 capsule (40 mg total) by mouth daily before breakfast. 90 capsule 3   nitrofurantoin (MACRODANTIN) 100 MG capsule Take 100 mg by mouth 2 (two) times daily. (Patient not taking: Reported on 07/17/2021)     pantoprazole (PROTONIX) 40 MG tablet Take 40 mg by mouth 2 (two) times daily.  (Patient not taking: Reported on 07/17/2021)     predniSONE (DELTASONE) 10 MG tablet Take 2 tablets (20 mg total) by mouth daily. (Patient not taking: No sig reported) 15 tablet 0   traZODone (DESYREL) 50 MG tablet Take 50 mg by mouth at bedtime. (Patient not taking: Reported on 06/13/2020)  5   Vitamin D, Ergocalciferol, (DRISDOL) 1.25 MG (50000 UNIT) CAPS capsule Take 1 capsule (50,000 Units total) by mouth every 7 (seven) days. 12 capsule 0   No current facility-administered medications on file prior to visit.    Social History   Socioeconomic History   Marital status: Widowed    Spouse name: Not on file   Number of children: Not on file   Years of education: Not on file   Highest education level: Not on file  Occupational History   Occupation: Retired Lawyer    Comment: [employer]  Tobacco Use   Smoking status: Never   Smokeless tobacco: Never  Vaping Use   Vaping Use: Never used  Substance and Sexual Activity   Alcohol use: No   Drug use: No   Sexual activity: Never  Other Topics Concern   Not on file  Social History Narrative   Not on file   Social Determinants of Health   Financial Resource Strain: Not on file  Food Insecurity: Not on file  Transportation Needs: Not on file  Physical Activity: Not on file  Stress: Not on file  Social Connections: Not on file  Intimate Partner Violence: Not on file    Family History  Problem Relation Age of Onset   Aneurysm Mother        Cerebral   Hypertension Mother    Breast cancer Sister    Breast cancer Sister    Colon cancer Neg Hx    Colon polyps Neg Hx     BP (!) 216/103   Pulse 79   Ht 5\' 3"  (1.6 m)   Wt 116 lb 6 oz (52.8 kg)   BMI 20.61 kg/m   Body mass index is 20.61 kg/m.      Objective:   Physical Exam Vitals and nursing note reviewed. Exam conducted with a chaperone present.  Constitutional:      Appearance: She is well-developed.  HENT:     Head: Normocephalic and atraumatic.  Eyes:      Conjunctiva/sclera: Conjunctivae normal.     Pupils: Pupils are equal, round, and reactive to light.  Cardiovascular:     Rate and Rhythm: Normal rate and regular rhythm.  Pulmonary:     Effort: Pulmonary effort is normal.  Abdominal:     Palpations: Abdomen is soft.  Musculoskeletal:       Arms:     Cervical back: Normal range of motion and neck supple.  Skin:    General: Skin is warm and dry.  Neurological:     Mental Status: She is  alert and oriented to person, place, and time.     Cranial Nerves: No cranial nerve deficit.     Motor: No abnormal muscle tone.     Coordination: Coordination normal.     Deep Tendon Reflexes: Reflexes are normal and symmetric. Reflexes normal.  Psychiatric:        Behavior: Behavior normal.        Thought Content: Thought content normal.        Judgment: Judgment normal.   X-rays were done of the lumbar spine,  reported separately.        Assessment & Plan:   Encounter Diagnosis  Name Primary?   Chronic midline low back pain with right-sided sciatica Yes   She has diffuse degenerative changes of the lumbar spine.  I will get MRI to make sure she has no occult fracture.  Return in one month.  She is on chronic pain medicine.    Call if any problem.  Precautions discussed.  Her blood pressure is elevated today and I have told her to tell Dr. Ouida Sills when she sees him later.  Electronically Signed Darreld Mclean, MD 6/20/202310:16 AM

## 2021-08-07 DIAGNOSIS — J449 Chronic obstructive pulmonary disease, unspecified: Secondary | ICD-10-CM | POA: Diagnosis not present

## 2021-08-10 ENCOUNTER — Ambulatory Visit (HOSPITAL_COMMUNITY): Payer: Medicare Other | Attending: Orthopaedic Surgery

## 2021-08-14 ENCOUNTER — Ambulatory Visit: Payer: Medicare Other | Admitting: Orthopaedic Surgery

## 2021-08-16 ENCOUNTER — Ambulatory Visit: Payer: Medicare Other | Admitting: Orthopaedic Surgery

## 2021-08-21 DIAGNOSIS — M545 Low back pain, unspecified: Secondary | ICD-10-CM | POA: Diagnosis not present

## 2021-08-21 DIAGNOSIS — I2510 Atherosclerotic heart disease of native coronary artery without angina pectoris: Secondary | ICD-10-CM | POA: Diagnosis not present

## 2021-08-21 DIAGNOSIS — I10 Essential (primary) hypertension: Secondary | ICD-10-CM | POA: Diagnosis not present

## 2021-08-23 DIAGNOSIS — H01002 Unspecified blepharitis right lower eyelid: Secondary | ICD-10-CM | POA: Diagnosis not present

## 2021-08-23 DIAGNOSIS — H01004 Unspecified blepharitis left upper eyelid: Secondary | ICD-10-CM | POA: Diagnosis not present

## 2021-08-23 DIAGNOSIS — H25811 Combined forms of age-related cataract, right eye: Secondary | ICD-10-CM | POA: Diagnosis not present

## 2021-08-23 DIAGNOSIS — H01001 Unspecified blepharitis right upper eyelid: Secondary | ICD-10-CM | POA: Diagnosis not present

## 2021-08-29 ENCOUNTER — Ambulatory Visit (HOSPITAL_COMMUNITY): Admission: RE | Admit: 2021-08-29 | Payer: Medicare Other | Source: Ambulatory Visit

## 2021-09-04 ENCOUNTER — Ambulatory Visit: Payer: Medicare Other | Admitting: Orthopaedic Surgery

## 2021-09-05 DIAGNOSIS — J449 Chronic obstructive pulmonary disease, unspecified: Secondary | ICD-10-CM | POA: Diagnosis not present

## 2021-09-17 DIAGNOSIS — S0502XA Injury of conjunctiva and corneal abrasion without foreign body, left eye, initial encounter: Secondary | ICD-10-CM | POA: Diagnosis not present

## 2021-09-18 ENCOUNTER — Emergency Department (HOSPITAL_COMMUNITY): Payer: Medicare Other

## 2021-09-18 ENCOUNTER — Emergency Department (HOSPITAL_COMMUNITY)
Admission: EM | Admit: 2021-09-18 | Discharge: 2021-09-18 | Disposition: A | Discharge status: Home / Self Care | Payer: Medicare Other | Attending: Emergency Medicine | Admitting: Emergency Medicine

## 2021-09-18 ENCOUNTER — Encounter (HOSPITAL_COMMUNITY): Payer: Self-pay

## 2021-09-18 DIAGNOSIS — S199XXA Unspecified injury of neck, initial encounter: Secondary | ICD-10-CM | POA: Diagnosis not present

## 2021-09-18 DIAGNOSIS — M25572 Pain in left ankle and joints of left foot: Secondary | ICD-10-CM | POA: Diagnosis not present

## 2021-09-18 DIAGNOSIS — R519 Headache, unspecified: Secondary | ICD-10-CM | POA: Insufficient documentation

## 2021-09-18 DIAGNOSIS — E119 Type 2 diabetes mellitus without complications: Secondary | ICD-10-CM | POA: Insufficient documentation

## 2021-09-18 DIAGNOSIS — J449 Chronic obstructive pulmonary disease, unspecified: Secondary | ICD-10-CM | POA: Diagnosis not present

## 2021-09-18 DIAGNOSIS — I10 Essential (primary) hypertension: Secondary | ICD-10-CM | POA: Diagnosis not present

## 2021-09-18 DIAGNOSIS — Z7951 Long term (current) use of inhaled steroids: Secondary | ICD-10-CM | POA: Insufficient documentation

## 2021-09-18 DIAGNOSIS — M25562 Pain in left knee: Secondary | ICD-10-CM | POA: Diagnosis not present

## 2021-09-18 DIAGNOSIS — G319 Degenerative disease of nervous system, unspecified: Secondary | ICD-10-CM | POA: Diagnosis not present

## 2021-09-18 DIAGNOSIS — Z91010 Allergy to peanuts: Secondary | ICD-10-CM | POA: Diagnosis not present

## 2021-09-18 DIAGNOSIS — W19XXXA Unspecified fall, initial encounter: Secondary | ICD-10-CM

## 2021-09-18 DIAGNOSIS — Z79899 Other long term (current) drug therapy: Secondary | ICD-10-CM | POA: Insufficient documentation

## 2021-09-18 DIAGNOSIS — J45909 Unspecified asthma, uncomplicated: Secondary | ICD-10-CM | POA: Diagnosis not present

## 2021-09-18 DIAGNOSIS — W109XXA Fall (on) (from) unspecified stairs and steps, initial encounter: Secondary | ICD-10-CM | POA: Insufficient documentation

## 2021-09-18 DIAGNOSIS — I639 Cerebral infarction, unspecified: Secondary | ICD-10-CM | POA: Diagnosis not present

## 2021-09-18 DIAGNOSIS — M25462 Effusion, left knee: Secondary | ICD-10-CM | POA: Diagnosis not present

## 2021-09-18 MED ORDER — HYDRALAZINE HCL 25 MG PO TABS
50.0000 mg | ORAL_TABLET | Freq: Once | ORAL | Status: AC
  Administered 2021-09-18: 50 mg via ORAL
  Filled 2021-09-18: qty 2

## 2021-09-18 MED ORDER — ACETAMINOPHEN 500 MG PO TABS
1000.0000 mg | ORAL_TABLET | Freq: Once | ORAL | Status: AC
  Administered 2021-09-18: 1000 mg via ORAL
  Filled 2021-09-18: qty 2

## 2021-09-18 NOTE — ED Provider Notes (Signed)
[facility] EMERGENCY DEPARTMENT Provider Note   CSN: [id number] Arrival date & time: 09/18/21  1445     History {Add pertinent medical, surgical, social history, OB history to HPI:1} Chief Complaint  Patient presents with   Fall    Sheila Myers is a 88 y.o. female with a history of diabetes mellitus type 2, hypertension, degenerative joint disease, COPD, asthma, chronic back pain, glaucoma, TIA 2017.  Presents to the emergency department complaint of fall, left knee pain, and left ankle pain.  Patient reports that today approximately 2 PM she was coming out of a funeral when she was walking down a set of stairs.  Patient reports that she missed a step and fell landing on her left knee.  Patient is unsure of any steps she fell down.  Patient denies hitting her head or any loss of consciousness.  Patient states that she has had pain to her left knee and left ankle since suffering the fall.  Pain is worse with touch and movement.  Patient has not had any medications to help with her pain.  Patient reports that she does have chronic back pain however states that this pain is unchanged since suffering a fall.  Patient reports that she does have chronic vision changes due to her glaucoma and cataracts however states that this is unchanged since suffering her fall.  Patient reports that she is not on any blood thinners.  Patient denies any numbness, weakness, headache, saddle anesthesia, bowel/bladder incontinence, neck pain, back pain, abdominal pain, nausea, vomiting, chest pain, shortness of breath.   Fall Pertinent negatives include no chest pain, no abdominal pain, no headaches and no shortness of breath.       Home Medications Prior to Admission medications   Medication Sig Start Date End Date Taking? Authorizing Provider  albuterol (VENTOLIN HFA) 108 (90 Base) MCG/ACT inhaler Inhale 1-2 puffs into the lungs every 6 (six) hours as needed for wheezing or shortness of breath. 03/31/19    Avegno, Zachery Dakins, FNP  alendronate (FOSAMAX) 70 MG tablet Take 70 mg by mouth once a week. 08/15/16   [provider]  amLODipine-benazepril (LOTREL) 10-20 MG capsule Take 1 capsule by mouth daily. 10/14/18   [provider]  atorvastatin (LIPITOR) 20 MG tablet Take 1 tablet by mouth at bedtime. 04/04/20   [provider]  bimatoprost (LUMIGAN) 0.03 % ophthalmic drops Place 1 drop into both eyes at bedtime.   Patient not taking: Reported on 06/13/2020    [provider]  Fluticasone-Salmeterol (ADVAIR DISKUS) 250-50 MCG/DOSE AEPB Inhale 1 puff into the lungs 2 (two) times daily. 04/05/19   Corum, Minerva Fester, MD  HYDROcodone-acetaminophen (NORCO) 10-325 MG tablet Take 1 tablet by mouth every 6 (six) hours as needed for pain. Patient not taking: Reported on 07/17/2021 07/30/17   [provider]  loratadine (CLARITIN) 10 MG tablet Take 1 tablet (10 mg total) by mouth daily. Patient not taking: Reported on 06/13/2020 03/31/19   Durward Parcel, FNP  NEXIUM 40 MG capsule Take 1 capsule (40 mg total) by mouth daily before breakfast. 09/30/19   Tiffany Kocher, PA-C  nitrofurantoin (MACRODANTIN) 100 MG capsule Take 100 mg by mouth 2 (two) times daily. Patient not taking: Reported on 07/17/2021    [provider]  pantoprazole (PROTONIX) 40 MG tablet Take 40 mg by mouth 2 (two) times daily. Patient not taking: Reported on 07/17/2021    [provider]  predniSONE (DELTASONE) 10 MG tablet Take 2  tablets (20 mg total) by mouth daily. Patient not taking: No sig reported 03/31/19   Durward Parcel, FNP  traZODone (DESYREL) 50 MG tablet Take 50 mg by mouth at bedtime. Patient not taking: Reported on 06/13/2020 08/04/17   [provider]  Vitamin D, Ergocalciferol, (DRISDOL) 1.25 MG (50000 UNIT) CAPS capsule Take 1 capsule (50,000 Units total) by mouth every 7 (seven) days. 06/10/19   Wandra Feinstein, MD      Allergies    Peanut-containing drug products,  Catapres [clonidine hcl], Codeine, and Morphine    Review of Systems   Review of Systems  Constitutional:  Negative for chills and fever.  Eyes:  Positive for visual disturbance (Baseline bilateral vision issues due to cataracts and glaucoma).  Respiratory:  Negative for shortness of breath.   Cardiovascular:  Negative for chest pain.  Gastrointestinal:  Negative for abdominal pain, nausea and vomiting.  Musculoskeletal:  Positive for arthralgias and back pain (chronic). Negative for neck pain.  Skin:  Negative for color change, pallor, rash and wound.  Neurological:  Negative for dizziness, tremors, seizures, syncope, facial asymmetry, speech difficulty, weakness, light-headedness and headaches.  Psychiatric/Behavioral:  Negative for confusion.     Physical Exam Updated Vital Signs BP (!) 155/88 (BP Location: Right Arm)   Pulse 73   Temp 98.2 F (36.8 C) (Oral)   Resp 16   Ht 5\' 3"  (1.6 m)   Wt 52.2 kg   SpO2 98%   BMI 20.37 kg/m  Physical Exam  ***  ED Results / Procedures / Treatments   Labs (all labs ordered are listed, but only abnormal results are displayed) Labs Reviewed - No data to display  EKG None  Radiology DG Ankle Complete Left  Result Date: 09/18/2021 CLINICAL DATA:  LEFT ankle pain, fell down stairs at church EXAM: LEFT ANKLE COMPLETE - 3+ VIEW COMPARISON:  None FINDINGS: Osseous demineralization. Joint spaces preserved. No acute fracture, dislocation, or bone destruction. Scattered vascular calcifications and a few phleboliths. IMPRESSION: No acute osseous abnormalities. Electronically Signed   By: 09/20/2021 M.D.   On: 09/18/2021 15:29   DG Knee Complete 4 Views Left  Result Date: 09/18/2021 CLINICAL DATA:  09/20/2021 coming down stairs at church, missed a stair, LEFT knee and LEFT ankle pain EXAM: LEFT KNEE - COMPLETE 4+ VIEW COMPARISON:  None FINDINGS: Osseous mineralization. Joint space narrowing and chondrocalcinosis. No acute fracture or dislocation.  Moderate knee joint effusion. Atherosclerotic calcifications from mid thigh to proximal to mid lower leg. IMPRESSION: Osseous demineralization with degenerative changes and question CPPD LEFT knee. LEFT knee joint effusion present without definite fracture or dislocation. Electronically Signed   By: [name] M.D.   On: 09/18/2021 15:26    Procedures Procedures  {Document cardiac monitor, telemetry assessment procedure when appropriate:1}  Medications Ordered in ED Medications  acetaminophen (TYLENOL) tablet 1,000 mg (1,000 mg Oral Given 09/18/21 1727)    ED Course/ Medical Decision Making/ A&P                           Medical Decision Making Amount and/or Complexity of Data Reviewed Radiology: ordered.  Risk OTC drugs.   ***  {Document critical care time when appropriate:1} {Document review of labs and clinical decision tools ie heart score, Chads2Vasc2 etc:1}  {Document your independent review of radiology images, and any outside records:1} {Document your discussion with family members, caretakers, and with consultants:1} {Document social determinants of health affecting pt's  care:1} {Document your decision making why or why not admission, treatments were needed:1} Final Clinical Impression(s) / ED Diagnoses Final diagnoses:  None    Rx / DC Orders ED Discharge Orders     None

## 2021-09-18 NOTE — Discharge Instructions (Addendum)
You came to the emergency department today to be evaluated for your left knee and ankle pain after suffering a fall.  The x-ray of your left knee and ankle did not show any broken bones or dislocations.  The CT scan of your head and neck did not show any acute abnormalities.  Due to your pain and swelling in your left knee please follow-up with your orthopedic provider for further management of your knee pain.  Please take your Norco as needed for pain.   Get help right away if: Your knee swells, and the swelling becomes worse. You cannot move your knee. You have severe pain in your knee that cannot be managed with pain medicine. Your foot, leg, toes, or ankle: Tingles or becomes numb. Becomes swollen. Turns pale or blue.

## 2021-09-18 NOTE — ED Triage Notes (Signed)
Pt states she was coming downstairs and missed stair, injuring left knee and foot. Denies hitting head, no LOC, no thinners

## 2021-09-18 NOTE — ED Notes (Addendum)
Ambulated patient to the bathroom.  Patient has a steady gait with walker. Patient states that there is some pain, but feels better than earlier.

## 2021-09-19 ENCOUNTER — Ambulatory Visit (HOSPITAL_COMMUNITY): Admission: RE | Admit: 2021-09-19 | Payer: Medicare Other | Source: Ambulatory Visit

## 2021-09-20 DIAGNOSIS — H25811 Combined forms of age-related cataract, right eye: Secondary | ICD-10-CM | POA: Diagnosis not present

## 2021-09-25 ENCOUNTER — Encounter (HOSPITAL_COMMUNITY)
Admission: RE | Admit: 2021-09-25 | Discharge: 2021-09-25 | Disposition: A | Discharge status: Home / Self Care | Payer: Medicare Other | Source: Ambulatory Visit | Attending: Ophthalmology | Admitting: Ophthalmology

## 2021-09-25 ENCOUNTER — Encounter (HOSPITAL_COMMUNITY): Payer: Self-pay

## 2021-09-25 ENCOUNTER — Other Ambulatory Visit: Payer: Self-pay

## 2021-09-25 ENCOUNTER — Ambulatory Visit: Payer: Medicare Other | Admitting: Orthopaedic Surgery

## 2021-09-25 NOTE — H&P (Signed)
Surgical History & Physical  Patient Name: Sheila Myers DOB: [birth year]  Surgery: Cataract extraction with intraocular lens implant phacoemulsification & Goniotomy Treatment (iAccess) ; Right Eye  Surgeon: [name] MD Surgery Date:  09-28-21 Pre-Op Date:  09-20-21  HPI: A 10 Yr. old female patient is referred by Dr Daphine Deutscher for cataract eval. 1. 1. The patient complains of difficulty when viewing TV, reading closed caption, news scrolls on TV, which began 3 months ago. Both eyes are affected. The episode is gradual. The condition's severity increased since last visit. Symptoms occur when the patient is inside and outside. This is negatively affecting the patient's quality of life and the patient is unable to function adequately in life with the current level of vision. HPI Completed by Dr. [name]  Medical History: Glaucoma Cataracts Arthritis Diabetes GERD Heart Problem LDL Stroke  Review of Systems Negative Allergic/Immunologic Negative Cardiovascular Negative Constitutional Negative Ear, Nose, Mouth & Throat Negative Endocrine Negative Eyes Negative Gastrointestinal Negative Genitourinary Negative Hemotologic/Lymphatic Negative Integumentary Negative Musculoskeletal Negative Neurological Negative Psychiatry Negative Respiratory  Social   Never smoked   Medication  Albuterol, Proair respiClick, Atorvastatin, Hydrocodone/Acetaminophen, Loratadine, Nexium, Potassium chloride,   Sx/Procedures Cataract Surgery,  Coronary Stent, Appendectomy, Hysterectomy, Total cystectomy, Spine injections,   Drug Allergies  Peanuts, Codeine, Morphine,   History & Physical: Heent: Cataract & Glaucoma; Right eye NECK: supple without bruits LUNGS: lungs clear to auscultation CV: regular rate and rhythm Abdomen: soft and non-tender Impression & Plan: Assessment: 1.  COMBINED FORMS AGE RELATED CATARACT; Right Eye (H25.811) 2.  BLEPHARITIS; Right Upper Lid, Right  Lower Lid, Left Upper Lid, Left Lower Lid (H01.001, H01.002,H01.004,H01.005) 3.  DERMATOCHALASIS, no surgery; Right Upper Lid, Left Upper Lid (H02.831, H02.834) 4.  ARCUS SENILIS; Both Eyes (H18.413) 5.  PCO; Left Eye (H26.492) 6.  Myopia ; Left Eye (H52.12) 7.  VITREOUS DETACHMENT PVD; Left Eye (H43.812) 8.  PRIMARY OPEN ANGLE GLAUCOMA; Right Eye Moderate, Left Eye Severe (H40.1112, H40.1123) 9.  INTRAOCULAR LENS IOL (Z96.1)  Plan: 1.  Cataract accounts for the patient's decreased vision. This visual impairment is not correctable with a tolerable change in glasses or contact lenses. Cataract surgery with an implantation of a new lens should significantly improve the visual and functional status of the patient. Discussed all risks, benefits, alternatives, and potential complications. Discussed the procedures and recovery. Patient desires to have surgery. A-scan ordered and performed today for intra-ocular lens calculations. The surgery will be performed in order to improve vision for driving, reading, and for eye examinations. Recommend phacoemulsification with intra-ocular lens. Recommend Dextenza for post-operative pain and inflammation. Right Eye. Dilates well - shugarcaine by protocol. Recommend iAcess for better IOP control OD.  2.  Recommend regular lid cleaning.  3.  Asymptomatic, recommend observation for now. Findings, prognosis and treatment options reviewed.  4.  Discussed significance of finding  5.  Appears visually significant. Will address after cataract surgery OD.  6.   7.  Old Asymptomatic. RD precautions given. Patient to call with increase in flashing lights/floaters/dark curtain. Symptomatic.  8.  IOP higher OD vs OS. Recommend re-starting Lumigan QHS OU. Compliance an issue. Unable to tolerate HVF. Recommend iAccess goniotomy right eye at the time of cataract surgery to improve compliance and help lower eye pressure.  9.  PCO OS as above.

## 2021-09-28 ENCOUNTER — Ambulatory Visit (HOSPITAL_COMMUNITY)
Admission: RE | Admit: 2021-09-28 | Discharge: 2021-09-28 | Disposition: A | Discharge status: Home / Self Care | Payer: Medicare Other | Attending: Ophthalmology | Admitting: Ophthalmology

## 2021-09-28 ENCOUNTER — Ambulatory Visit (HOSPITAL_BASED_OUTPATIENT_CLINIC_OR_DEPARTMENT_OTHER): Payer: Medicare Other | Admitting: Certified Registered"

## 2021-09-28 ENCOUNTER — Encounter (HOSPITAL_COMMUNITY)
Admission: RE | Disposition: A | Discharge status: Home / Self Care | Payer: Self-pay | Source: Home / Self Care | Attending: Ophthalmology

## 2021-09-28 ENCOUNTER — Encounter (HOSPITAL_COMMUNITY): Payer: Self-pay | Admitting: Ophthalmology

## 2021-09-28 ENCOUNTER — Ambulatory Visit (HOSPITAL_COMMUNITY): Payer: Medicare Other | Admitting: Certified Registered"

## 2021-09-28 DIAGNOSIS — H0100B Unspecified blepharitis left eye, upper and lower eyelids: Secondary | ICD-10-CM | POA: Insufficient documentation

## 2021-09-28 DIAGNOSIS — Z961 Presence of intraocular lens: Secondary | ICD-10-CM | POA: Diagnosis not present

## 2021-09-28 DIAGNOSIS — Z8673 Personal history of transient ischemic attack (TIA), and cerebral infarction without residual deficits: Secondary | ICD-10-CM | POA: Insufficient documentation

## 2021-09-28 DIAGNOSIS — H401112 Primary open-angle glaucoma, right eye, moderate stage: Secondary | ICD-10-CM | POA: Diagnosis not present

## 2021-09-28 DIAGNOSIS — H18413 Arcus senilis, bilateral: Secondary | ICD-10-CM | POA: Insufficient documentation

## 2021-09-28 DIAGNOSIS — E119 Type 2 diabetes mellitus without complications: Secondary | ICD-10-CM

## 2021-09-28 DIAGNOSIS — H25811 Combined forms of age-related cataract, right eye: Secondary | ICD-10-CM | POA: Diagnosis not present

## 2021-09-28 DIAGNOSIS — I10 Essential (primary) hypertension: Secondary | ICD-10-CM | POA: Insufficient documentation

## 2021-09-28 DIAGNOSIS — K219 Gastro-esophageal reflux disease without esophagitis: Secondary | ICD-10-CM | POA: Diagnosis not present

## 2021-09-28 DIAGNOSIS — J449 Chronic obstructive pulmonary disease, unspecified: Secondary | ICD-10-CM | POA: Insufficient documentation

## 2021-09-28 DIAGNOSIS — E1136 Type 2 diabetes mellitus with diabetic cataract: Secondary | ICD-10-CM | POA: Diagnosis not present

## 2021-09-28 DIAGNOSIS — H5212 Myopia, left eye: Secondary | ICD-10-CM | POA: Insufficient documentation

## 2021-09-28 DIAGNOSIS — H0100A Unspecified blepharitis right eye, upper and lower eyelids: Secondary | ICD-10-CM | POA: Insufficient documentation

## 2021-09-28 HISTORY — PX: CATARACT EXTRACTION W/PHACO: SHX586

## 2021-09-28 SURGERY — PHACOEMULSIFICATION, CATARACT, WITH IOL INSERTION
Anesthesia: Monitor Anesthesia Care | Site: Eye | Laterality: Right

## 2021-09-28 MED ORDER — LIDOCAINE HCL 3.5 % OP GEL: 1.0000 | Freq: Once | OPHTHALMIC | Status: DC

## 2021-09-28 MED ORDER — TRYPAN BLUE 0.06 % IO SOSY
PREFILLED_SYRINGE | INTRAOCULAR | Status: AC
  Filled 2021-09-28: qty 0.5

## 2021-09-28 MED ORDER — TROPICAMIDE 1 % OP SOLN
1.0000 [drp] | OPHTHALMIC | Status: AC | PRN
  Administered 2021-09-28 (×3): 1 [drp] via OPHTHALMIC

## 2021-09-28 MED ORDER — STERILE WATER FOR IRRIGATION IR SOLN
Status: DC | PRN
  Administered 2021-09-28: 500 mL

## 2021-09-28 MED ORDER — TETRACAINE HCL 0.5 % OP SOLN
1.0000 [drp] | OPHTHALMIC | Status: AC | PRN
  Administered 2021-09-28 (×3): 1 [drp] via OPHTHALMIC

## 2021-09-28 MED ORDER — MOXIFLOXACIN HCL 0.5 % OP SOLN
OPHTHALMIC | Status: DC | PRN
  Administered 2021-09-28: 1 [drp] via OPHTHALMIC

## 2021-09-28 MED ORDER — TRYPAN BLUE 0.06 % IO SOSY
PREFILLED_SYRINGE | INTRAOCULAR | Status: DC | PRN
  Administered 2021-09-28: 0.5 mL via INTRAOCULAR

## 2021-09-28 MED ORDER — BSS IO SOLN
INTRAOCULAR | Status: DC | PRN
  Administered 2021-09-28: 15 mL via INTRAOCULAR

## 2021-09-28 MED ORDER — POVIDONE-IODINE 5 % OP SOLN
OPHTHALMIC | Status: DC | PRN
  Administered 2021-09-28: 1 via OPHTHALMIC

## 2021-09-28 MED ORDER — MIDAZOLAM HCL 2 MG/2ML IJ SOLN
INTRAMUSCULAR | Status: DC | PRN
  Administered 2021-09-28: 1 mg via INTRAVENOUS

## 2021-09-28 MED ORDER — PHENYLEPHRINE-KETOROLAC 1-0.3 % IO SOLN
INTRAOCULAR | Status: AC
  Filled 2021-09-28: qty 4

## 2021-09-28 MED ORDER — PHENYLEPHRINE HCL 2.5 % OP SOLN
1.0000 [drp] | OPHTHALMIC | Status: AC | PRN
  Administered 2021-09-28 (×3): 1 [drp] via OPHTHALMIC

## 2021-09-28 MED ORDER — LIDOCAINE HCL (PF) 1 % IJ SOLN
INTRAOCULAR | Status: DC | PRN
  Administered 2021-09-28: 1 mL via OPHTHALMIC

## 2021-09-28 MED ORDER — SODIUM HYALURONATE 10 MG/ML IO SOLUTION
PREFILLED_SYRINGE | INTRAOCULAR | Status: DC | PRN
  Administered 2021-09-28: 0.85 mL via INTRAOCULAR

## 2021-09-28 MED ORDER — MIDAZOLAM HCL 2 MG/2ML IJ SOLN
INTRAMUSCULAR | Status: AC
  Filled 2021-09-28: qty 2

## 2021-09-28 MED ORDER — PHENYLEPHRINE-KETOROLAC 1-0.3 % IO SOLN
INTRAOCULAR | Status: DC | PRN
  Administered 2021-09-28: 500 mL via OPHTHALMIC

## 2021-09-28 MED ORDER — SODIUM HYALURONATE 23MG/ML IO SOSY
PREFILLED_SYRINGE | INTRAOCULAR | Status: DC | PRN
  Administered 2021-09-28: 0.6 mL via INTRAOCULAR

## 2021-09-28 SURGICAL SUPPLY — 18 items
CATARACT SUITE SIGHTPATH (MISCELLANEOUS) ×2 IMPLANT
CLIP IPRISM (KITS) ×1 IMPLANT
CLOTH BEACON ORANGE TIMEOUT ST (SAFETY) ×2 IMPLANT
EYE SHIELD UNIVERSAL CLEAR (GAUZE/BANDAGES/DRESSINGS) ×1 IMPLANT
FEE CATARACT SUITE SIGHTPATH (MISCELLANEOUS) ×2 IMPLANT
GLOVE BIOGEL PI IND STRL 6.5 (GLOVE) ×1 IMPLANT
GLOVE BIOGEL PI IND STRL 7.0 (GLOVE) ×3 IMPLANT
GLOVE ECLIPSE 6.0 STRL STRAW (GLOVE) ×1 IMPLANT
LENS IOL RAYNER 22.5 (Intraocular Lens) ×2 IMPLANT
LENS IOL RAYONE EMV 22.5 (Intraocular Lens) ×1 IMPLANT
NDL HYPO 18GX1.5 BLUNT FILL (NEEDLE) ×2 IMPLANT
NEEDLE HYPO 18GX1.5 BLUNT FILL (NEEDLE) ×2 IMPLANT
PAD ARMBOARD 7.5X6 YLW CONV (MISCELLANEOUS) ×2 IMPLANT
SYR TB 1ML LL NO SAFETY (SYRINGE) ×2 IMPLANT
TAPE SURG TRANSPORE 1 IN (GAUZE/BANDAGES/DRESSINGS) ×1 IMPLANT
TAPE SURGICAL TRANSPORE 1 IN (GAUZE/BANDAGES/DRESSINGS) ×2
TREPHINE GLAUKO IACCESS TRBCLR (OPHTHALMIC) ×1 IMPLANT
WATER STERILE IRR 500ML POUR (IV SOLUTION) ×1 IMPLANT

## 2021-09-28 NOTE — Interval H&P Note (Signed)
History and Physical Interval Note:  09/28/2021 12:08 PM  Sheila Myers  has presented today for surgery, with the diagnosis of combined forms age related cataract; right glaucoma; right.  The various methods of treatment have been discussed with the patient and family. After consideration of risks, benefits and other options for treatment, the patient has consented to  Procedure(s): CATARACT EXTRACTION PHACO AND INTRAOCULAR LENS PLACEMENT (IOC) (Right) Goniotomy (Right) as a surgical intervention.  The patient's history has been reviewed, patient examined, no change in status, stable for surgery.  I have reviewed the patient's chart and labs.  Questions were answered to the patient's satisfaction.     [name]

## 2021-09-28 NOTE — Op Note (Signed)
Date of procedure: 09/28/21  Pre-operative diagnosis:  Visually significant combined form age-related cataract, Right Eye (H25.811)  Post-operative diagnosis:  Visually significant combined form age-related cataract, Right Eye (H25.811)  Procedure: Removal of cataract via phacoemulsification and insertion of intra-ocular lens Rayner [id number] +22.5D into the capsular bag of the Right Eye  Attending surgeon: Gerda Diss. Mallie Linnemann, MD, MA  Anesthesia: MAC, Topical Akten  Complications: None  Estimated Blood Loss: <70m (minimal)  Specimens: None  Implants: As above  Indications:  Visually significant age-related cataract, Right Eye  Procedure:  The patient was seen and identified in the pre-operative area. The operative eye was identified and dilated.  The operative eye was marked.  Topical anesthesia was administered to the operative eye.     The patient was then to the operative suite and placed in the supine position.  A timeout was performed confirming the patient, procedure to be performed, and all other relevant information.   The patient's face was prepped and draped in the usual fashion for intra-ocular surgery.  A lid speculum was placed into the operative eye and the surgical microscope moved into place and focused.  A superotemporal paracentesis was created using a 20 gauge paracentesis blade. Vision Blue was used to stain the anterior capsule. Shugarcaine was injected into the anterior chamber.  Viscoelastic was injected into the anterior chamber.  A temporal clear-corneal main wound incision was created using a 2.448mmicrokeratome.  A continuous curvilinear capsulorrhexis was initiated using an irrigating cystitome and completed using capsulorrhexis forceps.  Hydrodissection and hydrodeliniation were performed.  Viscoelastic was injected into the anterior chamber.  A phacoemulsification handpiece and a chopper as a second instrument were used to remove the nucleus and epinucleus. The  irrigation/aspiration handpiece was used to remove any remaining cortical material.   The capsular bag was reinflated with viscoelastic, checked, and found to be intact.  The intraocular lens was inserted into the capsular bag.  The capsule bag appeared floppy.  The patient had trouble cooperating with the surgery.  It was decided that performing the goniotomy would be unsafe.  The irrigation/aspiration handpiece was used to remove any remaining viscoelastic.  The clear corneal wound and paracentesis wounds were then hydrated and checked with Weck-Cels to be watertight.  The lid-speculum was removed.  The drape was removed.  The patient's face was cleaned with a wet and dry 4x4.   Moxifloxacin was instilled on the eye. A clear shield was taped over the eye. The patient was taken to the post-operative care unit in good condition, having tolerated the procedure well.  Post-Op Instructions: [facility] and will receive all other orders and instructions.

## 2021-09-28 NOTE — Anesthesia Preprocedure Evaluation (Signed)
Anesthesia Evaluation  Patient identified by MRN, date of birth, ID band Patient awake    Reviewed: Allergy & Precautions, H&P , NPO status , Patient's Chart, lab work & pertinent test results, reviewed documented beta blocker date and time   Airway Mallampati: II  TM Distance: >3 FB Neck ROM: full    Dental no notable dental hx.    Pulmonary asthma , COPD,    Pulmonary exam normal breath sounds clear to auscultation       Cardiovascular Exercise Tolerance: Good hypertension, negative cardio ROS   Rhythm:regular Rate:Normal     Neuro/Psych PSYCHIATRIC DISORDERS Anxiety Depression TIA   GI/Hepatic Neg liver ROS, GERD  Medicated,  Endo/Other  negative endocrine ROSdiabetes  Renal/GU negative Renal ROS  negative genitourinary   Musculoskeletal   Abdominal   Peds  Hematology negative hematology ROS (+)   Anesthesia Other Findings   Reproductive/Obstetrics negative OB ROS                             Anesthesia Physical Anesthesia Plan  ASA: 3  Anesthesia Plan: General and MAC   Post-op Pain Management:    Induction:   PONV Risk Score and Plan:   Airway Management Planned:   Additional Equipment:   Intra-op Plan:   Post-operative Plan:   Informed Consent: I have reviewed the patients History and Physical, chart, labs and discussed the procedure including the risks, benefits and alternatives for the proposed anesthesia with the patient or authorized representative who has indicated his/her understanding and acceptance.     Dental Advisory Given  Plan Discussed with: CRNA  Anesthesia Plan Comments:         Anesthesia Quick Evaluation

## 2021-09-28 NOTE — Transfer of Care (Signed)
Immediate Anesthesia Transfer of Care Note  Patient: Sheila Myers  Procedure(s) Performed: CATARACT EXTRACTION PHACO AND INTRAOCULAR LENS PLACEMENT (IOC) (Right: Eye)  Patient Location: Short Stay  Anesthesia Type:MAC  Level of Consciousness: awake, alert , oriented and patient cooperative  Airway & Oxygen Therapy: Patient Spontanous Breathing  Post-op Assessment: Report given to RN, Post -op Vital signs reviewed and stable and Patient moving all extremities  Post vital signs: Reviewed and stable  Last Vitals:  Vitals Value Taken Time  BP    Temp    Pulse    Resp    SpO2      Last Pain:  Vitals:   09/28/21 1141  PainSc: 0-No pain         Complications: No notable events documented.

## 2021-09-28 NOTE — Discharge Instructions (Addendum)
Please discharge patient when stable, will follow up today with Dr. Wrzosek at the [facility] office immediately following discharge.  Leave shield in place until visit.  All paperwork with discharge instructions will be given at the office.  [facility] Address:  [address]  [place], [place] [address]  

## 2021-09-29 NOTE — Anesthesia Postprocedure Evaluation (Signed)
Anesthesia Post Note  Patient: Sheila Myers  Procedure(s) Performed: CATARACT EXTRACTION PHACO AND INTRAOCULAR LENS PLACEMENT (IOC) (Right: Eye)  Patient location during evaluation: Phase II Anesthesia Type: MAC Level of consciousness: awake Pain management: pain level controlled Vital Signs Assessment: post-procedure vital signs reviewed and stable Respiratory status: spontaneous breathing and respiratory function stable Cardiovascular status: blood pressure returned to baseline and stable Postop Assessment: no headache and no apparent nausea or vomiting Anesthetic complications: no Comments: Late entry   No notable events documented.   Last Vitals:  Vitals:   09/28/21 1136 09/28/21 1249  BP: (!) 185/68 (!) 157/75  Pulse: 81 82  Resp: 14 13  Temp: 36.6 C 37 C  SpO2: 98% 99%    Last Pain:  Vitals:   09/28/21 1249  TempSrc: Oral  PainSc: [place]

## 2021-10-04 ENCOUNTER — Encounter (HOSPITAL_COMMUNITY): Payer: Self-pay | Admitting: Ophthalmology

## 2021-10-04 DIAGNOSIS — J449 Chronic obstructive pulmonary disease, unspecified: Secondary | ICD-10-CM | POA: Diagnosis not present

## 2021-10-05 NOTE — Addendum Note (Signed)
Addendum  created 10/05/21 1216 by Lorin Glass, CRNA   SmartForm saved

## 2021-10-08 ENCOUNTER — Ambulatory Visit (HOSPITAL_COMMUNITY)
Admission: RE | Admit: 2021-10-08 | Discharge: 2021-10-08 | Disposition: A | Discharge status: Home / Self Care | Payer: Medicare Other | Source: Ambulatory Visit | Attending: Orthopaedic Surgery | Admitting: Orthopaedic Surgery

## 2021-10-08 DIAGNOSIS — G8929 Other chronic pain: Secondary | ICD-10-CM | POA: Diagnosis not present

## 2021-10-08 DIAGNOSIS — M48061 Spinal stenosis, lumbar region without neurogenic claudication: Secondary | ICD-10-CM | POA: Diagnosis not present

## 2021-10-08 DIAGNOSIS — M5441 Lumbago with sciatica, right side: Secondary | ICD-10-CM | POA: Insufficient documentation

## 2021-10-08 DIAGNOSIS — M5126 Other intervertebral disc displacement, lumbar region: Secondary | ICD-10-CM | POA: Diagnosis not present

## 2021-10-08 DIAGNOSIS — M47816 Spondylosis without myelopathy or radiculopathy, lumbar region: Secondary | ICD-10-CM | POA: Diagnosis not present

## 2021-10-11 ENCOUNTER — Ambulatory Visit: Payer: Medicare Other | Admitting: Orthopaedic Surgery

## 2021-10-16 ENCOUNTER — Ambulatory Visit (INDEPENDENT_AMBULATORY_CARE_PROVIDER_SITE_OTHER): Payer: Medicare Other | Admitting: Orthopaedic Surgery

## 2021-10-16 ENCOUNTER — Encounter: Payer: Self-pay | Admitting: Orthopaedic Surgery

## 2021-10-16 VITALS — Ht 63.0 in | Wt 114.0 lb

## 2021-10-16 DIAGNOSIS — M25562 Pain in left knee: Secondary | ICD-10-CM

## 2021-10-16 DIAGNOSIS — G8929 Other chronic pain: Secondary | ICD-10-CM

## 2021-10-16 DIAGNOSIS — M5441 Lumbago with sciatica, right side: Secondary | ICD-10-CM | POA: Diagnosis not present

## 2021-10-16 MED ORDER — METHYLPREDNISOLONE ACETATE 40 MG/ML IJ SUSP
40.0000 mg | Freq: Once | INTRAMUSCULAR | Status: AC
  Administered 2021-10-16: 40 mg via INTRA_ARTICULAR

## 2021-10-16 NOTE — Progress Notes (Signed)
My back is better.  I hurt my knee.  She had MRI of the lumbar spine showing: IMPRESSION: 1. No evidence of acute compression fracture or other acute osseous abnormality. 2. Advanced multilevel degenerative changes of the lumbar spine with moderate canal stenosis at L2-L3, mild-to-moderate canal stenosis at L3-4 and L4-5. Overall, little interval progression from prior. 3. Multilevel foraminal stenosis with moderate-to-severe right foraminal stenosis at L3-4 and severe left foraminal stenosis at L5-S1.  I have explained the findings to her.  I was concerned about a possible fracture and she does not have that.  She hurt her left knee about a month ago.  She was seen in the ER on 09-19-21.  X-rays were done.  She still has some swelling.  I have reviewed the notes and the X-rays.  I have independently reviewed and interpreted x-rays of this patient done at another site by another physician or qualified health professional.  X-rays showed: IMPRESSION: Osseous demineralization with degenerative changes and question CPPD LEFT knee.   LEFT knee joint effusion present without definite fracture or dislocation.  She still has some swelling.  She has no new trauma.  Spine/Pelvis examination:  Inspection:  Overall, sacoiliac joint benign and hips nontender; without crepitus or defects.   Thoracic spine inspection: Alignment normal without kyphosis present   Lumbar spine inspection:  Alignment  with normal lumbar lordosis, without scoliosis apparent.   Thoracic spine palpation:  without tenderness of spinal processes   Lumbar spine palpation: without tenderness of lumbar area; without tightness of lumbar muscles    Range of Motion:   Lumbar flexion, forward flexion is normal without pain or tenderness    Lumbar extension is full without pain or tenderness   Left lateral bend is normal without pain or tenderness   Right lateral bend is normal without pain or tenderness   Straight  leg raising is normal  Strength & tone: normal   Stability overall normal stability  Left knee with effusion, crepitus, ROM 0 to 105, stable, NV intact.  Encounter Diagnoses  Name Primary?   Chronic midline low back pain with right-sided sciatica Yes   Chronic pain of left knee    PROCEDURE NOTE:  The patient requests injections of the left knee , verbal consent was obtained.  The left knee was prepped appropriately after time out was performed.   Sterile technique was observed and injection of 1 cc of DepoMedrol 40 mg with several cc's of plain xylocaine. Anesthesia was provided by ethyl chloride and a 20-gauge needle was used to inject the knee area. The injection was tolerated well.  A band aid dressing was applied.  The patient was advised to apply ice later today and tomorrow to the injection sight as needed.  Return in one month.  Call if any problem.  Precautions discussed.  Electronically Signed Sanjuana Kava, MD 9/19/202311:28 AM

## 2021-10-16 NOTE — Addendum Note (Signed)
Addended by: Obie Dredge A on: 10/16/2021 01:17 PM   Modules accepted: Orders

## 2021-10-29 DIAGNOSIS — H1032 Unspecified acute conjunctivitis, left eye: Secondary | ICD-10-CM | POA: Diagnosis not present

## 2021-10-29 DIAGNOSIS — Z9841 Cataract extraction status, right eye: Secondary | ICD-10-CM | POA: Diagnosis not present

## 2021-10-29 DIAGNOSIS — Z961 Presence of intraocular lens: Secondary | ICD-10-CM | POA: Diagnosis not present

## 2021-11-02 DIAGNOSIS — J449 Chronic obstructive pulmonary disease, unspecified: Secondary | ICD-10-CM | POA: Diagnosis not present

## 2021-11-13 ENCOUNTER — Encounter: Payer: Self-pay | Admitting: Orthopaedic Surgery

## 2021-11-13 ENCOUNTER — Ambulatory Visit (INDEPENDENT_AMBULATORY_CARE_PROVIDER_SITE_OTHER): Payer: Medicare Other | Admitting: Orthopaedic Surgery

## 2021-11-13 DIAGNOSIS — M5441 Lumbago with sciatica, right side: Secondary | ICD-10-CM

## 2021-11-13 DIAGNOSIS — M25562 Pain in left knee: Secondary | ICD-10-CM | POA: Diagnosis not present

## 2021-11-13 DIAGNOSIS — G8929 Other chronic pain: Secondary | ICD-10-CM | POA: Diagnosis not present

## 2021-11-13 MED ORDER — METHYLPREDNISOLONE ACETATE 40 MG/ML IJ SUSP
40.0000 mg | Freq: Once | INTRAMUSCULAR | Status: AC
  Administered 2021-11-13: 40 mg via INTRA_ARTICULAR

## 2021-11-13 NOTE — Progress Notes (Signed)
Her back is much better but her left knee still has some swelling, no new trauma.  PROCEDURE NOTE:  The patient requests injections of the left knee , verbal consent was obtained.  The left knee was prepped appropriately after time out was performed.   Sterile technique was observed and injection of 1 cc of DepoMedrol 40 mg with several cc's of plain xylocaine. Anesthesia was provided by ethyl chloride and a 20-gauge needle was used to inject the knee area. The injection was tolerated well.  A band aid dressing was applied.  The patient was advised to apply ice later today and tomorrow to the injection sight as needed.  Encounter Diagnoses  Name Primary?   Chronic pain of left knee Yes   Chronic midline low back pain with right-sided sciatica    Return in one month.  Call if any problem.  Precautions discussed.  Electronically Signed Sanjuana Kava, MD 10/17/20232:04 PM

## 2021-12-04 DIAGNOSIS — J449 Chronic obstructive pulmonary disease, unspecified: Secondary | ICD-10-CM | POA: Diagnosis not present

## 2021-12-11 ENCOUNTER — Ambulatory Visit (INDEPENDENT_AMBULATORY_CARE_PROVIDER_SITE_OTHER): Payer: Medicare Other | Admitting: Orthopaedic Surgery

## 2021-12-11 ENCOUNTER — Encounter: Payer: Self-pay | Admitting: Orthopaedic Surgery

## 2021-12-11 DIAGNOSIS — M25562 Pain in left knee: Secondary | ICD-10-CM | POA: Diagnosis not present

## 2021-12-11 DIAGNOSIS — G8929 Other chronic pain: Secondary | ICD-10-CM

## 2021-12-11 NOTE — Progress Notes (Signed)
My knee is better.  The injection in the left knee helped.  She has less pain and less swelling.  She still uses a wheelchair.  She has no new trauma.  ROM left knee is 0 to 100, effusion, crepitus, medial pain, NV intact.  Encounter Diagnosis  Name Primary?   Chronic pain of left knee Yes   Return in one month.  Try Advil or Aleve.  Call if any problem.  Precautions discussed.  Electronically Signed Darreld Mclean, MD 11/14/20232:09 PM

## 2021-12-11 NOTE — Patient Instructions (Signed)
As the weather changes and gets cooler, you may notice you are affected more. You may have more pain in your joints. This is normal. Dress warmly and make sure that area is covered well.   Dr.Keeling is here all day on Tuesdays, Wednesday mornings, and Thursday mornings. If you need anything such as a medication refill, please either call BEFORE the end of the day on WEDNESDAY or send a message through mychart. Your pharmacy can send a refill request for you. Calling by the end of the day on WEDNESDAYS allows us time to send Dr.Keeling the request and for him to respond before he leaves on Thursdays.  If Dr. Keeling is out of the office, we may send it to one of the other providers and they may not refill it for the same amount that your original prescription is for.   

## 2022-01-02 DIAGNOSIS — J449 Chronic obstructive pulmonary disease, unspecified: Secondary | ICD-10-CM | POA: Diagnosis not present

## 2022-01-15 ENCOUNTER — Ambulatory Visit: Payer: Medicare Other | Admitting: Orthopaedic Surgery

## 2022-02-06 DIAGNOSIS — J449 Chronic obstructive pulmonary disease, unspecified: Secondary | ICD-10-CM | POA: Diagnosis not present

## 2022-03-05 DIAGNOSIS — H401112 Primary open-angle glaucoma, right eye, moderate stage: Secondary | ICD-10-CM | POA: Diagnosis not present

## 2022-03-05 DIAGNOSIS — H401123 Primary open-angle glaucoma, left eye, severe stage: Secondary | ICD-10-CM | POA: Diagnosis not present

## 2022-03-05 DIAGNOSIS — H26493 Other secondary cataract, bilateral: Secondary | ICD-10-CM | POA: Diagnosis not present

## 2022-03-10 DIAGNOSIS — J449 Chronic obstructive pulmonary disease, unspecified: Secondary | ICD-10-CM | POA: Diagnosis not present

## 2022-04-02 ENCOUNTER — Encounter: Payer: Self-pay | Admitting: Orthopaedic Surgery

## 2022-04-02 ENCOUNTER — Ambulatory Visit (INDEPENDENT_AMBULATORY_CARE_PROVIDER_SITE_OTHER): Payer: 59 | Admitting: Orthopaedic Surgery

## 2022-04-02 VITALS — BP 212/88 | HR 61 | Ht 63.0 in | Wt 114.0 lb

## 2022-04-02 DIAGNOSIS — G8929 Other chronic pain: Secondary | ICD-10-CM | POA: Diagnosis not present

## 2022-04-02 DIAGNOSIS — M25562 Pain in left knee: Secondary | ICD-10-CM

## 2022-04-02 NOTE — Progress Notes (Signed)
PROCEDURE NOTE:  The patient requests injections of the left knee , verbal consent was obtained.  The left knee was prepped appropriately after time out was performed.   Sterile technique was observed and injection of 1 cc of DepoMedrol 40 mg with several cc's of plain xylocaine. Anesthesia was provided by ethyl chloride and a 20-gauge needle was used to inject the knee area. The injection was tolerated well.  A band aid dressing was applied.  The patient was advised to apply ice later today and tomorrow to the injection sight as needed.  Encounter Diagnosis  Name Primary?   Chronic pain of left knee Yes   Return prn.  Call if any problem.  Precautions discussed.  Electronically Signed Sanjuana Kava, MD 3/5/20242:45 PM

## 2022-04-03 DIAGNOSIS — E119 Type 2 diabetes mellitus without complications: Secondary | ICD-10-CM | POA: Diagnosis not present

## 2022-04-03 DIAGNOSIS — Z79899 Other long term (current) drug therapy: Secondary | ICD-10-CM | POA: Diagnosis not present

## 2022-04-03 DIAGNOSIS — I2510 Atherosclerotic heart disease of native coronary artery without angina pectoris: Secondary | ICD-10-CM | POA: Diagnosis not present

## 2022-04-03 DIAGNOSIS — I10 Essential (primary) hypertension: Secondary | ICD-10-CM | POA: Diagnosis not present

## 2022-04-04 DIAGNOSIS — K219 Gastro-esophageal reflux disease without esophagitis: Secondary | ICD-10-CM | POA: Diagnosis not present

## 2022-04-04 DIAGNOSIS — I10 Essential (primary) hypertension: Secondary | ICD-10-CM | POA: Diagnosis not present

## 2022-04-10 DIAGNOSIS — H04123 Dry eye syndrome of bilateral lacrimal glands: Secondary | ICD-10-CM | POA: Diagnosis not present

## 2022-04-10 DIAGNOSIS — H401112 Primary open-angle glaucoma, right eye, moderate stage: Secondary | ICD-10-CM | POA: Diagnosis not present

## 2022-04-10 DIAGNOSIS — H26493 Other secondary cataract, bilateral: Secondary | ICD-10-CM | POA: Diagnosis not present

## 2022-04-12 DIAGNOSIS — J449 Chronic obstructive pulmonary disease, unspecified: Secondary | ICD-10-CM | POA: Diagnosis not present

## 2022-06-26 DIAGNOSIS — H04123 Dry eye syndrome of bilateral lacrimal glands: Secondary | ICD-10-CM | POA: Diagnosis not present

## 2022-06-26 DIAGNOSIS — H401123 Primary open-angle glaucoma, left eye, severe stage: Secondary | ICD-10-CM | POA: Diagnosis not present

## 2022-06-26 DIAGNOSIS — H26493 Other secondary cataract, bilateral: Secondary | ICD-10-CM | POA: Diagnosis not present

## 2022-06-26 DIAGNOSIS — H401112 Primary open-angle glaucoma, right eye, moderate stage: Secondary | ICD-10-CM | POA: Diagnosis not present

## 2022-07-16 DIAGNOSIS — I2510 Atherosclerotic heart disease of native coronary artery without angina pectoris: Secondary | ICD-10-CM | POA: Diagnosis not present

## 2022-07-16 DIAGNOSIS — Z79899 Other long term (current) drug therapy: Secondary | ICD-10-CM | POA: Diagnosis not present

## 2022-07-16 DIAGNOSIS — I10 Essential (primary) hypertension: Secondary | ICD-10-CM | POA: Diagnosis not present

## 2022-09-25 DIAGNOSIS — H401112 Primary open-angle glaucoma, right eye, moderate stage: Secondary | ICD-10-CM | POA: Diagnosis not present

## 2022-09-25 DIAGNOSIS — H26493 Other secondary cataract, bilateral: Secondary | ICD-10-CM | POA: Diagnosis not present

## 2022-09-25 DIAGNOSIS — H04123 Dry eye syndrome of bilateral lacrimal glands: Secondary | ICD-10-CM | POA: Diagnosis not present

## 2022-09-25 DIAGNOSIS — H401123 Primary open-angle glaucoma, left eye, severe stage: Secondary | ICD-10-CM | POA: Diagnosis not present

## 2022-09-26 DIAGNOSIS — E785 Hyperlipidemia, unspecified: Secondary | ICD-10-CM | POA: Diagnosis not present

## 2022-09-26 DIAGNOSIS — M545 Low back pain, unspecified: Secondary | ICD-10-CM | POA: Diagnosis not present

## 2022-09-26 DIAGNOSIS — I10 Essential (primary) hypertension: Secondary | ICD-10-CM | POA: Diagnosis not present

## 2022-09-26 DIAGNOSIS — E1122 Type 2 diabetes mellitus with diabetic chronic kidney disease: Secondary | ICD-10-CM | POA: Diagnosis not present

## 2022-11-01 ENCOUNTER — Encounter: Payer: Self-pay | Admitting: Family Medicine

## 2022-11-01 ENCOUNTER — Ambulatory Visit (INDEPENDENT_AMBULATORY_CARE_PROVIDER_SITE_OTHER): Payer: 59 | Admitting: Family Medicine

## 2022-11-01 VITALS — BP 122/68 | HR 83 | Ht 63.0 in | Wt 111.0 lb

## 2022-11-01 DIAGNOSIS — Z23 Encounter for immunization: Secondary | ICD-10-CM | POA: Diagnosis not present

## 2022-11-01 DIAGNOSIS — M5441 Lumbago with sciatica, right side: Secondary | ICD-10-CM

## 2022-11-01 DIAGNOSIS — K219 Gastro-esophageal reflux disease without esophagitis: Secondary | ICD-10-CM

## 2022-11-01 DIAGNOSIS — R7303 Prediabetes: Secondary | ICD-10-CM | POA: Diagnosis not present

## 2022-11-01 DIAGNOSIS — I10 Essential (primary) hypertension: Secondary | ICD-10-CM

## 2022-11-01 DIAGNOSIS — H9190 Unspecified hearing loss, unspecified ear: Secondary | ICD-10-CM

## 2022-11-01 DIAGNOSIS — G8929 Other chronic pain: Secondary | ICD-10-CM

## 2022-11-01 DIAGNOSIS — E559 Vitamin D deficiency, unspecified: Secondary | ICD-10-CM

## 2022-11-01 DIAGNOSIS — E538 Deficiency of other specified B group vitamins: Secondary | ICD-10-CM | POA: Diagnosis not present

## 2022-11-01 DIAGNOSIS — R1319 Other dysphagia: Secondary | ICD-10-CM

## 2022-11-01 MED ORDER — NEXIUM 40 MG PO CPDR: 40.0000 mg | DELAYED_RELEASE_CAPSULE | Freq: Every day | ORAL | 3 refills | Status: DC

## 2022-11-01 MED ORDER — TIZANIDINE HCL 4 MG PO TABS
4.0000 mg | ORAL_TABLET | Freq: Four times a day (QID) | ORAL | 1 refills | Status: DC | PRN
Start: 2022-11-01 — End: 2023-01-01

## 2022-11-01 MED ORDER — AMLODIPINE BESY-BENAZEPRIL HCL 10-20 MG PO CAPS: 1.0000 | ORAL_CAPSULE | Freq: Every day | ORAL | 2 refills | Status: DC

## 2022-11-01 NOTE — Patient Instructions (Signed)

## 2022-11-01 NOTE — Progress Notes (Unsigned)
New Patient Office Visit   Subjective   Patient ID: Sheila Myers, female    DOB: 1928-04-03  Age: 89 y.o. MRN: [id number]  CC:  Chief Complaint  Patient presents with   Establish Care    Patient complains of back pain on R side radiating down R leg.     HPI Sheila Myers presents to establish care. She  has a past medical history of Anxiety, ASCVD (arteriosclerotic cardiovascular disease), Asthma, Chronic back pain, Chronic obstructive pulmonary disease (HCC), Degenerative joint disease, Depression, Diabetes mellitus type II, Gastroesophageal reflux disease, Glaucoma, Glaucoma, Hyperlipidemia, Hypertension, Irritable bowel syndrome, and Stroke (HCC) (2017).  Back Pain This is a recurrent problem. The problem occurs constantly. The pain is present in the lumbar spine. The quality of the pain is described as shooting. The pain radiates to the right thigh. The pain is at a severity of 8/10. The symptoms are aggravated by lying down, position and standing. Stiffness is present All day. Associated symptoms include leg pain. Pertinent negatives include no bladder incontinence, bowel incontinence or pelvic pain. Risk factors include sedentary lifestyle and poor posture. She has tried analgesics for the symptoms. The treatment provided mild relief.    .idno Outpatient Encounter Medications as of 11/01/2022  Medication Sig   albuterol (VENTOLIN HFA) 108 (90 Base) MCG/ACT inhaler Inhale 1-2 puffs into the lungs every 6 (six) hours as needed for wheezing or shortness of breath.   amLODipine-benazepril (LOTREL) 10-20 MG capsule Take 1 capsule by mouth daily.   atorvastatin (LIPITOR) 20 MG tablet Take 1 tablet by mouth at bedtime.   bimatoprost (LUMIGAN) 0.03 % ophthalmic drops Place 1 drop into both eyes at bedtime.   HYDROcodone-acetaminophen (NORCO) 10-325 MG tablet Take 1 tablet by mouth 2 (two) times daily.   NEXIUM 40 MG capsule Take 1 capsule (40 mg total) by mouth daily before  breakfast.   Vitamin D, Ergocalciferol, (DRISDOL) 1.25 MG (50000 UNIT) CAPS capsule Take 1 capsule (50,000 Units total) by mouth every 7 (seven) days.   alendronate (FOSAMAX) 70 MG tablet Take 70 mg by mouth once a week. (Patient not taking: Reported on 11/01/2022)   Fluticasone-Salmeterol (ADVAIR DISKUS) 250-50 MCG/DOSE AEPB Inhale 1 puff into the lungs 2 (two) times daily. (Patient not taking: Reported on 11/01/2022)   No facility-administered encounter medications on file as of 11/01/2022.    Past Surgical History:  Procedure Laterality Date   ABDOMINAL HYSTERECTOMY     APPENDECTOMY     CATARACT EXTRACTION W/PHACO Right 09/28/2021   Procedure: CATARACT EXTRACTION PHACO AND INTRAOCULAR LENS PLACEMENT (IOC);  Surgeon: Sheila Pierce, MD;  Location: [facility];  Service: Ophthalmology;  Laterality: Right;  27.44   CHOLECYSTECTOMY     COLONOSCOPY WITH ESOPHAGOGASTRODUODENOSCOPY (EGD)  2007   Dr. Darrick Myers: diverticulosis, external hemorrhoids, hiatal hernia, patent Schatzki ring.   CORONARY ANGIOPLASTY WITH STENT PLACEMENT  1991   ESOPHAGOGASTRODUODENOSCOPY  2012   Dr. Darrick Myers: Peptic stricture versus ring status post dilation, gastric inflammation and polyps,  biopsies unremarkable.   ESOPHAGOGASTRODUODENOSCOPY (EGD) WITH PROPOFOL N/A 08/19/2017   Procedure: ESOPHAGOGASTRODUODENOSCOPY (EGD) WITH PROPOFOL;  Surgeon: Sheila Bali, MD;  Location: [facility];  Service: Endoscopy;  Laterality: N/A;  2:15pm   SAVORY DILATION N/A 08/19/2017   Procedure: SAVORY DILATION;  Surgeon: Sheila Bali, MD;  Location: [facility];  Service: Endoscopy;  Laterality: N/A;    Review of Systems  Gastrointestinal:  Negative for bowel incontinence.  Genitourinary:  Negative for bladder incontinence and  pelvic pain.  Musculoskeletal:  Positive for back pain.      Objective    BP (!) 172/62   Pulse 83   Ht 5\' 3"  (1.6 m)   Wt 111 lb (50.3 kg)   SpO2 96%   BMI 19.66 kg/m   Physical Exam    Assessment &  Plan:  Encounter for immunization -     Flu Vaccine Trivalent High Dose (Fluad)    No follow-ups on file.   Sheila Lederer Newman Nip, FNP

## 2022-11-02 MED ORDER — ALENDRONATE SODIUM 70 MG PO TABS: 70.0000 mg | ORAL_TABLET | ORAL | 10 refills | Status: DC

## 2022-11-02 NOTE — Assessment & Plan Note (Addendum)
Vitals:   11/01/22 1609 11/01/22 1648  BP: (!) 172/62 122/68   Follow up in 1 week with at home blood pressure readings Amlodipine-Benazepril 10-20 mg tablet once daily, Labs ordered in today's vist Explained non pharmacological interventions such as low salt, DASH diet discussed. Educated on stress reduction and physical activity minimum 150 minutes per week. Discussed signs and symptoms of major cardiovascular event and need to present to the ED.  Patient verbalizes understanding regarding plan of care and all questions answered.

## 2022-11-02 NOTE — Assessment & Plan Note (Signed)
Zanaflex 4 mg PRN Discussed medication desired effects, potential side effects. Non pharmacological interventions include rest, avoid twisting,improper bending, straining lower back. Demonstration of proper body mechanics. Alternate ice and heat. Recommend stretching back and legs. Follow up for worsening or persistent symptoms. Patient verbalizes understanding regarding plan of care and all questions answered.

## 2022-11-27 DIAGNOSIS — H1030 Unspecified acute conjunctivitis, unspecified eye: Secondary | ICD-10-CM | POA: Diagnosis not present

## 2022-12-05 DIAGNOSIS — H1030 Unspecified acute conjunctivitis, unspecified eye: Secondary | ICD-10-CM | POA: Diagnosis not present

## 2022-12-11 DIAGNOSIS — E538 Deficiency of other specified B group vitamins: Secondary | ICD-10-CM | POA: Diagnosis not present

## 2022-12-11 DIAGNOSIS — I10 Essential (primary) hypertension: Secondary | ICD-10-CM | POA: Diagnosis not present

## 2022-12-11 DIAGNOSIS — R7303 Prediabetes: Secondary | ICD-10-CM | POA: Diagnosis not present

## 2022-12-11 DIAGNOSIS — E559 Vitamin D deficiency, unspecified: Secondary | ICD-10-CM | POA: Diagnosis not present

## 2022-12-12 NOTE — Progress Notes (Signed)
Please inform patient,   Hemoglobin A1c 6.3 indicates prediabetes - no medication intervention just lifestyle changes   A Sample Day of Eating for Diabetes or Prediabetes:  Breakfast: Oatmeal with a handful of berries and a sprinkle of chia seeds, paired with a boiled egg.  Lunch: Grilled chicken breast on a bed of spinach and kale, topped with avocado, a drizzle of olive oil, and a slice of whole grain bread.  Snack: A handful of almonds or a small apple with peanut butter.  Dinner: Baked salmon with roasted broccoli and quinoa.  Snack (if needed): A piece of cheese or a small bowl of Greek yogurt.  Find an activity that you will enjoyandstart to be active at least 5 days a week for 30 minutes each day.     Vitamin D levels low, Continue taking vitamin D weekly supplements, Consuming Vitamin D rich food sources include fish, salmon, sardines, egg yolks, red meat, liver, oranges, soy milk.    Your eGFR levels indicates reduced  kidney function, I advise to keep your hypertension controlled, maintain blood pressure reading goals under 130/80, take your daily blood pressure medications. Keep your cholesterol under control to prevent further damage to blood vessels. Avoid NSAIDs medications and take tylenol for pain management.Consume a kidney friendly diet which includes Veggies: cauliflower, onions, eggplant, turnips. Low sodium, low to moderate intake of proteins: lean meats (poultry, fish), eggs, unsalted seafood. Avoid fatty foods, limit or avoid smoking and alcohol intake. Maintain an excercise routine to minimum of 150 minuties a week.   Follow up in 4-6 months to recheck labs.

## 2022-12-13 LAB — CBC WITH DIFFERENTIAL/PLATELET
Basophils Absolute: 0 10*3/uL (ref 0.0–0.2)
Basos: 0 %
EOS (ABSOLUTE): 0.2 10*3/uL (ref 0.0–0.4)
Eos: 2 %
Hematocrit: 41.2 % (ref 34.0–46.6)
Hemoglobin: 12.5 g/dL (ref 11.1–15.9)
Immature Grans (Abs): 0 10*3/uL (ref 0.0–0.1)
Immature Granulocytes: 0 %
Lymphocytes Absolute: 2.6 10*3/uL (ref 0.7–3.1)
Lymphs: 30 %
MCH: 22.2 pg — ABNORMAL LOW (ref 26.6–33.0)
MCHC: 30.3 g/dL — ABNORMAL LOW (ref 31.5–35.7)
MCV: 73 fL — ABNORMAL LOW (ref 79–97)
Monocytes Absolute: 0.5 10*3/uL (ref 0.1–0.9)
Monocytes: 6 %
Neutrophils Absolute: 5.3 10*3/uL (ref 1.4–7.0)
Neutrophils: 62 %
Platelets: 247 10*3/uL (ref 150–450)
RBC: 5.63 x10E6/uL — ABNORMAL HIGH (ref 3.77–5.28)
RDW: 17 % — ABNORMAL HIGH (ref 11.7–15.4)
WBC: 8.7 10*3/uL (ref 3.4–10.8)

## 2022-12-13 LAB — CMP14+EGFR
ALT: 10 [IU]/L (ref 0–32)
AST: 17 [IU]/L (ref 0–40)
Albumin: 3.9 g/dL (ref 3.6–4.6)
Alkaline Phosphatase: 68 [IU]/L (ref 44–121)
BUN/Creatinine Ratio: 18 (ref 12–28)
BUN: 17 mg/dL (ref 10–36)
Bilirubin Total: 0.9 mg/dL (ref 0.0–1.2)
CO2: 27 mmol/L (ref 20–29)
Calcium: 9.8 mg/dL (ref 8.7–10.3)
Chloride: 103 mmol/L (ref 96–106)
Creatinine, Ser: 0.93 mg/dL (ref 0.57–1.00)
Globulin, Total: 2.8 g/dL (ref 1.5–4.5)
Glucose: 126 mg/dL — ABNORMAL HIGH (ref 70–99)
Potassium: 3.8 mmol/L (ref 3.5–5.2)
Sodium: 144 mmol/L (ref 134–144)
Total Protein: 6.7 g/dL (ref 6.0–8.5)
eGFR: 58 mL/min/{1.73_m2} — ABNORMAL LOW (ref >59)

## 2022-12-13 LAB — LIPID PANEL
Chol/HDL Ratio: 2.3 {ratio} (ref 0.0–4.4)
Cholesterol, Total: 141 mg/dL (ref 100–199)
HDL: 62 mg/dL (ref >39)
LDL Chol Calc (NIH): 68 mg/dL (ref 0–99)
Triglycerides: 51 mg/dL (ref 0–149)
VLDL Cholesterol Cal: 11 mg/dL (ref 5–40)

## 2022-12-13 LAB — MICROALBUMIN / CREATININE URINE RATIO
Creatinine, Urine: 412.5 mg/dL
Microalb/Creat Ratio: 9 mg/g{creat} (ref 0–29)
Microalbumin, Urine: 38.5 ug/mL

## 2022-12-13 LAB — HEMOGLOBIN A1C
Est. average glucose Bld gHb Est-mCnc: 134 mg/dL
Hgb A1c MFr Bld: 6.3 % — ABNORMAL HIGH (ref 4.8–5.6)

## 2022-12-13 LAB — VITAMIN D 25 HYDROXY (VIT D DEFICIENCY, FRACTURES): Vit D, 25-Hydroxy: 23.3 ng/mL — ABNORMAL LOW (ref 30.0–100.0)

## 2022-12-13 LAB — VITAMIN B12: Vitamin B-12: 399 pg/mL (ref 232–1245)

## 2022-12-18 ENCOUNTER — Telehealth: Payer: Self-pay

## 2022-12-18 NOTE — Telephone Encounter (Signed)
Vm box is full.

## 2022-12-18 NOTE — Telephone Encounter (Signed)
Hey labs results information under comments

## 2022-12-20 NOTE — Telephone Encounter (Signed)
Aide wants to be contacted at [phone].

## 2022-12-23 DIAGNOSIS — Z961 Presence of intraocular lens: Secondary | ICD-10-CM | POA: Diagnosis not present

## 2022-12-23 DIAGNOSIS — E119 Type 2 diabetes mellitus without complications: Secondary | ICD-10-CM | POA: Diagnosis not present

## 2022-12-23 DIAGNOSIS — H5203 Hypermetropia, bilateral: Secondary | ICD-10-CM | POA: Diagnosis not present

## 2022-12-23 DIAGNOSIS — I158 Other secondary hypertension: Secondary | ICD-10-CM | POA: Diagnosis not present

## 2022-12-24 NOTE — Telephone Encounter (Signed)
Spoke with pt went over lab work, reports back pain may need a cortisone shot on f/u appt scheduled 12/2 just fyi

## 2022-12-24 NOTE — Telephone Encounter (Signed)
thanks

## 2022-12-28 NOTE — Progress Notes (Unsigned)
   Established Patient Office Visit   Subjective  Patient ID: Sheila Myers, female    DOB: [birth year]  Age: 89 y.o. MRN: [id number]  No chief complaint on file.   She  has a past medical history of Anxiety, ASCVD (arteriosclerotic cardiovascular disease), Asthma, Chronic back pain, Chronic obstructive pulmonary disease (HCC), Degenerative joint disease, Depression, Diabetes mellitus type II, Gastroesophageal reflux disease, Glaucoma, Glaucoma, Hyperlipidemia, Hypertension, Irritable bowel syndrome, and Stroke (HCC) (2017).  HPI  ROS    Objective:     There were no vitals taken for this visit. {Vitals History (Optional):23777}  Physical Exam   No results found for any visits on 12/30/22.  The ASCVD Risk score (Arnett DK, et al., 2019) failed to calculate for the following reasons:   The 2019 ASCVD risk score is only valid for ages 66 to 62   The patient has a prior MI or stroke diagnosis    Assessment & Plan:  There are no diagnoses linked to this encounter.  No follow-ups on file.   Cruzita Lederer Newman Nip, FNP

## 2022-12-28 NOTE — Patient Instructions (Signed)

## 2022-12-30 ENCOUNTER — Encounter (INDEPENDENT_AMBULATORY_CARE_PROVIDER_SITE_OTHER): Payer: Self-pay | Admitting: Family Medicine

## 2022-12-30 NOTE — Progress Notes (Signed)
No show

## 2022-12-31 NOTE — Progress Notes (Unsigned)
   Established Patient Office Visit   Subjective  Patient ID: Sheila Myers, female    DOB: November 12, 1928  Age: 89 y.o. MRN: [id number]  No chief complaint on file.   She  has a past medical history of Anxiety, ASCVD (arteriosclerotic cardiovascular disease), Asthma, Chronic back pain, Chronic obstructive pulmonary disease (HCC), Degenerative joint disease, Depression, Diabetes mellitus type II, Gastroesophageal reflux disease, Glaucoma, Glaucoma, Hyperlipidemia, Hypertension, Irritable bowel syndrome, and Stroke (HCC) (2017).  HPI  ROS    Objective:     There were no vitals taken for this visit. {Vitals History (Optional):23777}  Physical Exam   No results found for any visits on 01/01/23.  The ASCVD Risk score (Arnett DK, et al., 2019) failed to calculate for the following reasons:   The 2019 ASCVD risk score is only valid for ages 83 to 77   The patient has a prior MI or stroke diagnosis    Assessment & Plan:  There are no diagnoses linked to this encounter.  No follow-ups on file.   Cruzita Lederer Newman Nip, FNP

## 2022-12-31 NOTE — Patient Instructions (Signed)
        Great to see you today.  I have refilled the medication(s) we provide.    - Please take medications as prescribed. - Follow up with your primary health provider if any health concerns arises. - If symptoms worsen please contact your primary care provider and/or visit the emergency department.  

## 2023-01-01 ENCOUNTER — Encounter: Payer: Self-pay | Admitting: Family Medicine

## 2023-01-01 ENCOUNTER — Ambulatory Visit (INDEPENDENT_AMBULATORY_CARE_PROVIDER_SITE_OTHER): Payer: 59 | Admitting: Family Medicine

## 2023-01-01 VITALS — BP 140/80 | HR 69 | Ht 63.0 in | Wt 110.1 lb

## 2023-01-01 DIAGNOSIS — G894 Chronic pain syndrome: Secondary | ICD-10-CM | POA: Diagnosis not present

## 2023-01-01 DIAGNOSIS — K219 Gastro-esophageal reflux disease without esophagitis: Secondary | ICD-10-CM | POA: Diagnosis not present

## 2023-01-01 DIAGNOSIS — G8929 Other chronic pain: Secondary | ICD-10-CM | POA: Insufficient documentation

## 2023-01-01 DIAGNOSIS — M545 Low back pain, unspecified: Secondary | ICD-10-CM

## 2023-01-01 MED ORDER — AMLODIPINE BESY-BENAZEPRIL HCL 10-20 MG PO CAPS: 1.0000 | ORAL_CAPSULE | Freq: Every day | ORAL | 2 refills | Status: DC

## 2023-01-01 MED ORDER — ALENDRONATE SODIUM 70 MG PO TABS: 70.0000 mg | ORAL_TABLET | ORAL | 10 refills | Status: AC

## 2023-01-01 MED ORDER — TIZANIDINE HCL 4 MG PO TABS: 4.0000 mg | ORAL_TABLET | Freq: Four times a day (QID) | ORAL | 3 refills | Status: DC | PRN

## 2023-01-01 MED ORDER — VITAMIN D (ERGOCALCIFEROL) 1.25 MG (50000 UNIT) PO CAPS: 50000.0000 [IU] | ORAL_CAPSULE | ORAL | 0 refills | Status: DC

## 2023-01-01 MED ORDER — METHYLPREDNISOLONE ACETATE 40 MG/ML IJ SUSP
40.0000 mg | Freq: Once | INTRAMUSCULAR | Status: DC
Start: 2023-01-01 — End: 2023-01-01

## 2023-01-01 MED ORDER — ATORVASTATIN CALCIUM 20 MG PO TABS: 20.0000 mg | ORAL_TABLET | Freq: Every day | ORAL | 2 refills | Status: AC

## 2023-01-01 MED ORDER — FLUTICASONE-SALMETEROL 100-50 MCG/ACT IN AEPB: 1.0000 | INHALATION_SPRAY | Freq: Two times a day (BID) | RESPIRATORY_TRACT | 3 refills | Status: AC

## 2023-01-01 MED ORDER — NEXIUM 40 MG PO CPDR
40.0000 mg | DELAYED_RELEASE_CAPSULE | Freq: Every day | ORAL | 3 refills | Status: DC
Start: 2023-01-01 — End: 2023-05-08

## 2023-01-01 MED ORDER — HYDROCODONE-ACETAMINOPHEN 10-325 MG PO TABS: 1.0000 | ORAL_TABLET | Freq: Two times a day (BID) | ORAL | 0 refills | Status: DC | PRN

## 2023-01-01 MED ORDER — METHYLPREDNISOLONE ACETATE 80 MG/ML IJ SUSP
40.0000 mg | Freq: Once | INTRAMUSCULAR | Status: AC
  Administered 2023-01-01: 40 mg via INTRAMUSCULAR

## 2023-01-01 MED ORDER — ALBUTEROL SULFATE HFA 108 (90 BASE) MCG/ACT IN AERS: 1.0000 | INHALATION_SPRAY | Freq: Four times a day (QID) | RESPIRATORY_TRACT | 0 refills | Status: AC | PRN

## 2023-01-01 NOTE — Assessment & Plan Note (Signed)
Chronic pain due Advanced multilevel degenerative changes of the lumbar spine with moderate canal stenosis at L2-L3, mild-to-moderate canal stenosis at L3-4 and L4-5.  Multilevel foraminal stenosis with moderate-to-severe right foraminal stenosis at L3-4 and severe left foraminal stenosis at  L5-S1. NORCO 10-325 MG tablet twice daily as needed

## 2023-01-01 NOTE — Assessment & Plan Note (Addendum)
Acute on Chronic  Mri results showed advanced multilevel degenerative changes of the lumbar spine with moderate canal stenosis at L2-L3, mild-to-moderate canal stenosis at L3-4 and L4-5.  Multilevel foraminal stenosis with moderate-to-severe right foraminal stenosis at L3-4 and severe left foraminal stenosis at  L5-S1 Referral placed to physical therapy Depo-Medrol IM injection 40 mg given today Pain management includes Norco and Zanaflex 4 mg PRN

## 2023-02-14 ENCOUNTER — Other Ambulatory Visit: Payer: Self-pay | Admitting: Family Medicine

## 2023-02-24 DIAGNOSIS — H1033 Unspecified acute conjunctivitis, bilateral: Secondary | ICD-10-CM | POA: Diagnosis not present

## 2023-03-26 ENCOUNTER — Other Ambulatory Visit: Payer: Self-pay | Admitting: Family Medicine

## 2023-03-26 NOTE — Telephone Encounter (Signed)
 Copied from CRM [id number]. Topic: Clinical - Medication Refill >> Mar 26, 2023 11:17 AM Geroge Baseman wrote: Most Recent Primary Care Visit:  Provider: [facility]  Department: [facility]  Visit Type: OFFICE VISIT  Date: 01/01/2023  Medication: HYDROcodone-acetaminophen (NORCO) 10-325 MG tablet   Has the patient contacted their pharmacy? Yes   Is this the correct pharmacy for this prescription? Yes If no, delete pharmacy and type the correct one.  This is the patient's preferred pharmacy:  [id number] - [place], [place] - [address] [place] [address] Phone: [phone] Fax: [phone]   Has the prescription been filled recently? No  Is the patient out of the medication? Yes  Has the patient been seen for an appointment in the last year OR does the patient have an upcoming appointment? Yes  Can we respond through MyChart? No  Agent: Please be advised that Rx refills may take up to 3 business days. We ask that you follow-up with your pharmacy.

## 2023-03-26 NOTE — Telephone Encounter (Unsigned)
 Copied from CRM [id number]. Topic: Clinical - Medication Refill >> Mar 26, 2023 11:17 AM Geroge Baseman wrote: Most Recent Primary Care Visit:  Provider: [facility]  Department: [facility]  Visit Type: OFFICE VISIT  Date: 01/01/2023  Medication: HYDROcodone-acetaminophen (NORCO) 10-325 MG tablet   Has the patient contacted their pharmacy? Yes   Is this the correct pharmacy for this prescription? Yes If no, delete pharmacy and type the correct one.  This is the patient's preferred pharmacy:  [id number] - [place], [place] - [address] [place] [address] Phone: [phone] Fax: [phone]   Has the prescription been filled recently? No  Is the patient out of the medication? Yes  Has the patient been seen for an appointment in the last year OR does the patient have an upcoming appointment? Yes  Can we respond through MyChart? No  Agent: Please be advised that Rx refills may take up to 3 business days. We ask that you follow-up with your pharmacy.

## 2023-03-27 MED ORDER — HYDROCODONE-ACETAMINOPHEN 10-325 MG PO TABS: 1.0000 | ORAL_TABLET | Freq: Two times a day (BID) | ORAL | 0 refills | Status: DC | PRN

## 2023-04-08 ENCOUNTER — Other Ambulatory Visit: Payer: Self-pay | Admitting: Family Medicine

## 2023-04-08 NOTE — Telephone Encounter (Unsigned)
 Copied from CRM [id number]. Topic: Clinical - Medication Refill >> Apr 08, 2023 11:28 AM Benetta Spar B wrote: Most Recent Primary Care Visit:  Provider: [facility]  Department: [facility]  Visit Type: OFFICE VISIT  Date: 01/01/2023  Medication: HYDROcodone-acetaminophen (NORCO) 10-325 MG tablet  2nd REQUEST Has the patient contacted their pharmacy? yes (Agent: If yes, when and what did the pharmacy advise?)contact pcp  Is this the correct pharmacy for this prescription? yes  This is the patient's preferred pharmacy:  [id number] - [place], [place] - [address] [place] [address] Phone: [phone] Fax: [phone]   Has the prescription been filled recently? no  Is the patient out of the medication? yes  Has the patient been seen for an appointment in the last year OR does the patient have an upcoming appointment? yes  Can we respond through MyChart? no  Agent: Please be advised that Rx refills may take up to 3 business days. We ask that you follow-up with your pharmacy.

## 2023-04-08 NOTE — Telephone Encounter (Signed)
 Last Fill: 03/27/23 60 tabs/0 RF  Last OV: 01/01/23 Next OV: 05/08/23   Routing to provider for review/authorization.

## 2023-04-09 ENCOUNTER — Other Ambulatory Visit: Payer: Self-pay | Admitting: Family Medicine

## 2023-04-09 MED ORDER — HYDROCODONE-ACETAMINOPHEN 10-325 MG PO TABS: 1.0000 | ORAL_TABLET | Freq: Two times a day (BID) | ORAL | 0 refills | Status: AC | PRN

## 2023-04-24 ENCOUNTER — Telehealth: Payer: Self-pay

## 2023-04-24 NOTE — Progress Notes (Signed)
   04/24/2023  Patient ID: Cherene Altes, female   DOB: 1928/08/18, 89 y.o.   MRN: [id number]   Patient appeared on insurance report for not passing the quality metrics in 2024:  Medication Adherence for Cholesterol (MAC)   Outreach to the patient was not needed today.  Meds Tracking:  -Atorvastatin 20 mg - Last fill 90DS on 03/31/23, LDL 69 on 07/17/23. Does not qualify for metric yet this year, next fill due 06/29/23  -Amlodipine-Benazepril 10mg /20mg  - Last fill 90DS on 03/18/23, BP 128/64 on 09/26/22. Does not qualify for metric yet, next fill due 06/16/23.  Plan:  Review fill history on 06/19/23, outreach if no fills for amlodipine-benazepril.  Fayette Pho, PharmD

## 2023-05-08 ENCOUNTER — Ambulatory Visit (INDEPENDENT_AMBULATORY_CARE_PROVIDER_SITE_OTHER): Payer: 59 | Admitting: Family Medicine

## 2023-05-08 ENCOUNTER — Telehealth: Payer: Self-pay | Admitting: Pharmacy Technician

## 2023-05-08 ENCOUNTER — Encounter: Payer: Self-pay | Admitting: Family Medicine

## 2023-05-08 ENCOUNTER — Other Ambulatory Visit (HOSPITAL_COMMUNITY): Payer: Self-pay

## 2023-05-08 VITALS — BP 135/67 | HR 62 | Ht 63.0 in | Wt 105.0 lb

## 2023-05-08 DIAGNOSIS — N1831 Chronic kidney disease, stage 3a: Secondary | ICD-10-CM

## 2023-05-08 DIAGNOSIS — E038 Other specified hypothyroidism: Secondary | ICD-10-CM

## 2023-05-08 DIAGNOSIS — R7303 Prediabetes: Secondary | ICD-10-CM | POA: Diagnosis not present

## 2023-05-08 DIAGNOSIS — E559 Vitamin D deficiency, unspecified: Secondary | ICD-10-CM

## 2023-05-08 DIAGNOSIS — R600 Localized edema: Secondary | ICD-10-CM | POA: Diagnosis not present

## 2023-05-08 DIAGNOSIS — K219 Gastro-esophageal reflux disease without esophagitis: Secondary | ICD-10-CM | POA: Diagnosis not present

## 2023-05-08 DIAGNOSIS — E1122 Type 2 diabetes mellitus with diabetic chronic kidney disease: Secondary | ICD-10-CM | POA: Diagnosis not present

## 2023-05-08 DIAGNOSIS — I10 Essential (primary) hypertension: Secondary | ICD-10-CM

## 2023-05-08 DIAGNOSIS — H109 Unspecified conjunctivitis: Secondary | ICD-10-CM | POA: Diagnosis not present

## 2023-05-08 DIAGNOSIS — B9689 Other specified bacterial agents as the cause of diseases classified elsewhere: Secondary | ICD-10-CM

## 2023-05-08 MED ORDER — NEXIUM 40 MG PO CPDR: 40.0000 mg | DELAYED_RELEASE_CAPSULE | Freq: Every day | ORAL | 3 refills | Status: DC

## 2023-05-08 MED ORDER — AMLODIPINE BESY-BENAZEPRIL HCL 10-20 MG PO CAPS: 1.0000 | ORAL_CAPSULE | Freq: Every day | ORAL | 1 refills | Status: AC

## 2023-05-08 MED ORDER — FUROSEMIDE 20 MG PO TABS: 20.0000 mg | ORAL_TABLET | ORAL | 1 refills | Status: AC | PRN

## 2023-05-08 MED ORDER — TIZANIDINE HCL 4 MG PO TABS: 4.0000 mg | ORAL_TABLET | Freq: Two times a day (BID) | ORAL | 3 refills | Status: AC | PRN

## 2023-05-08 MED ORDER — POLYMYXIN B-TRIMETHOPRIM 10000-0.1 UNIT/ML-% OP SOLN: 1.0000 [drp] | Freq: Four times a day (QID) | OPHTHALMIC | 0 refills | Status: AC

## 2023-05-08 NOTE — Assessment & Plan Note (Signed)
 Vitals:   05/08/23 1307  BP: 135/67  Amlodipine-Benazepril 10-20 mg tablet once daily, can take lasix 20 mg PRN for edema Labs ordered. Discussed with  patient to monitor their blood pressure regularly and maintain a heart-healthy diet rich in fruits, vegetables, whole grains, and low-fat dairy, while reducing sodium intake to less than 2,300 mg per day. Regular physical activity, such as 30 minutes of moderate exercise most days of the week, will help lower blood pressure and improve overall cardiovascular health. Avoiding smoking, limiting alcohol consumption, and managing stress. Take  prescribed medication, & take it as directed and avoid skipping doses. Seek emergency care if your blood pressure is (over 180/100) or you experience chest pain, shortness of breath, or sudden vision changes.Patient verbalizes understanding regarding plan of care and all questions answered.

## 2023-05-08 NOTE — Assessment & Plan Note (Signed)
Trimethoprim-polymyxin B drops x 5 days Discussed handwashing to limit spread to others, decrease rubbing of eye, careful flushing of eyes with saline.

## 2023-05-08 NOTE — Patient Instructions (Signed)

## 2023-05-08 NOTE — Assessment & Plan Note (Signed)
 Last Hemoglobin A1c: 6.3 Labs: Ordered today, results pending; will follow up accordingly.   Reviewed non-pharmacological interventions, including a balanced diet rich in lean proteins, healthy fats, whole grains, and high-fiber vegetables. Emphasized reducing refined sugars and processed carbohydrates, and incorporating more fruits, leafy greens, and legumes. Education: Patient was educated on recognizing signs and symptoms of both hypoglycemia and hyperglycemia, and advised to seek emergency care if these symptoms occur. Follow-Up: Scheduled for follow-up in 3-6 months, or sooner if needed. Patient Understanding: The patient verbalized understanding of the care plan, and all questions were answered. Additional Care: Ophthalmology exam current. Foot exam results were within normal limits.

## 2023-05-08 NOTE — Telephone Encounter (Signed)
 Pharmacy Patient Advocate Encounter   Received notification from CoverMyMeds that prior authorization for NEXIUM 40 MG delayed-release capsules is required/requested.   Insurance verification completed.   The patient is insured through [facility] .   Per test claim: PA required; PA submitted to above mentioned insurance via CoverMyMeds Key/confirmation #/[id number] Status is pending

## 2023-05-08 NOTE — Telephone Encounter (Signed)
 Pharmacy Patient Advocate Encounter  Received notification from [facility] that Prior Authorization for NEXIUM 40 MG delayed-release capsules has been DENIED.  Full denial letter will be uploaded to the media tab. See denial reason below.   PA #/Case ID/Reference #: [id number]  Patient has to have tried and failed all of the covered formulary alternatives. No documentation of a trial and failure of both dexlansoprazole or rabeprazole. Please also consider generic esomeprazole instead of Brand Name Nexium which is covered by the plan. Current Rx written for Nexium with a DAW1.

## 2023-05-08 NOTE — Progress Notes (Signed)
 Established Patient Office Visit   Subjective  Patient ID: Sheila Myers, female    DOB: [birth year]  Age: 89 y.o. MRN: [id number]  Chief Complaint  Patient presents with   Medical Management of Chronic Issues    4 month f/u hronic follow-up, pre-diabetes, Back pain, hypertension, hyperlipidemia, routine labs. Random Rt. Foot swelling x 2 weeks  Pt reports having severe back pain x 2 weeks affecting movement     She  has a past medical history of Anxiety, ASCVD (arteriosclerotic cardiovascular disease), Asthma, Chronic back pain, Chronic obstructive pulmonary disease (HCC), Degenerative joint disease, Depression, Diabetes mellitus type II, Gastroesophageal reflux disease, Glaucoma, Glaucoma, Hyperlipidemia, Hypertension, Irritable bowel syndrome, and Stroke (HCC) (2017).  HPI Patient presents to the clinic for chronic follow up. For the details of today's visit, please refer to assessment and plan.   Review of Systems  Constitutional:  Negative for chills and fever.  Eyes:  Positive for discharge. Negative for blurred vision.  Respiratory:  Negative for shortness of breath.   Cardiovascular:  Negative for chest pain.  Gastrointestinal:  Negative for abdominal pain.  Musculoskeletal:  Positive for back pain and myalgias.  Neurological:  Negative for dizziness and headaches.      Objective:     BP 135/67   Pulse 62   Ht 5\' 3"  (1.6 m)   Wt 105 lb 0.6 oz (47.6 kg)   SpO2 96%   BMI 18.61 kg/m  BP Readings from Last 3 Encounters:  05/08/23 135/67  01/01/23 (!) 140/80  11/01/22 122/68      Physical Exam Vitals reviewed.  Constitutional:      General: She is not in acute distress.    Appearance: Normal appearance. She is not ill-appearing, toxic-appearing or diaphoretic.  HENT:     Head: Normocephalic.  Eyes:     General:        Right eye: No discharge.        Left eye: No discharge.     Conjunctiva/sclera: Conjunctivae normal.     Right eye: Exudate present.      Left eye: Exudate present.  Cardiovascular:     Rate and Rhythm: Normal rate.     Pulses: Normal pulses.  Pulmonary:     Effort: Pulmonary effort is normal. No respiratory distress.     Breath sounds: Normal breath sounds.  Abdominal:     General: Bowel sounds are normal.     Palpations: Abdomen is soft.     Tenderness: There is no abdominal tenderness. There is no right CVA tenderness, left CVA tenderness or guarding.  Skin:    General: Skin is warm and dry.     Capillary Refill: Capillary refill takes less than 2 seconds.  Neurological:     Mental Status: She is alert.     Coordination: Coordination abnormal.     Gait: Gait abnormal.  Psychiatric:        Mood and Affect: Mood normal.        Behavior: Behavior normal.      No results found for any visits on 05/08/23.  The ASCVD Risk score (Arnett DK, et al., 2019) failed to calculate for the following reasons:   The 2019 ASCVD risk score is only valid for ages 41 to 27   Risk score cannot be calculated because patient has a medical history suggesting prior/existing ASCVD    Assessment & Plan:  Primary hypertension Assessment & Plan: Vitals:   05/08/23 1307  BP: 135/67  Amlodipine-Benazepril  10-20 mg tablet once daily, can take lasix 20 mg PRN for edema Labs ordered. Discussed with  patient to monitor their blood pressure regularly and maintain a heart-healthy diet rich in fruits, vegetables, whole grains, and low-fat dairy, while reducing sodium intake to less than 2,300 mg per day. Regular physical activity, such as 30 minutes of moderate exercise most days of the week, will help lower blood pressure and improve overall cardiovascular health. Avoiding smoking, limiting alcohol consumption, and managing stress. Take  prescribed medication, & take it as directed and avoid skipping doses. Seek emergency care if your blood pressure is (over 180/100) or you experience chest pain, shortness of breath, or sudden vision  changes.Patient verbalizes understanding regarding plan of care and all questions answered.   Orders: -     Lipid panel -     CMP14+EGFR -     CBC with Differential/Platelet  Gastroesophageal reflux disease, unspecified whether esophagitis present -     NexIUM; Take 1 capsule (40 mg total) by mouth daily before breakfast.  Dispense: 90 capsule; Refill: 3  Localized edema  Vitamin D deficiency -     VITAMIN D 25 Hydroxy (Vit-D Deficiency, Fractures)  TSH (thyroid-stimulating hormone deficiency) -     TSH + free T4  Prediabetes -     Hemoglobin A1c  Bacterial conjunctivitis of both eyes -     Polymyxin B-Trimethoprim; Place 1 drop into both eyes every 6 (six) hours for 5 days.  Dispense: 10 mL; Refill: 0  Bacterial conjunctivitis Assessment & Plan: Trimethoprim-polymyxin B drops x 5 days Discussed handwashing to limit spread to others, decrease rubbing of eye, careful flushing of eyes with saline.     Type 2 diabetes mellitus with stage 3a chronic kidney disease, without long-term current use of insulin (HCC) Assessment & Plan: Last Hemoglobin A1c: 6.3 Labs: Ordered today, results pending; will follow up accordingly.   Reviewed non-pharmacological interventions, including a balanced diet rich in lean proteins, healthy fats, whole grains, and high-fiber vegetables. Emphasized reducing refined sugars and processed carbohydrates, and incorporating more fruits, leafy greens, and legumes. Education: Patient was educated on recognizing signs and symptoms of both hypoglycemia and hyperglycemia, and advised to seek emergency care if these symptoms occur. Follow-Up: Scheduled for follow-up in 3-6 months, or sooner if needed. Patient Understanding: The patient verbalized understanding of the care plan, and all questions were answered. Additional Care: Ophthalmology exam current. Foot exam results were within normal limits.    Other orders -     amLODIPine Besy-Benazepril HCl; Take 1  capsule by mouth daily.  Dispense: 90 capsule; Refill: 1 -     tiZANidine HCl; Take 1 tablet (4 mg total) by mouth every 12 (twelve) hours as needed for muscle spasms.  Dispense: 60 tablet; Refill: 3 -     Furosemide; Take 1 tablet (20 mg total) by mouth as needed.  Dispense: 20 tablet; Refill: 1    Return in about 6 months (around 11/07/2023), or if symptoms worsen or fail to improve, for chronic follow-up.   Cruzita Lederer Newman Nip, FNP

## 2023-05-09 ENCOUNTER — Other Ambulatory Visit: Payer: Self-pay | Admitting: Family Medicine

## 2023-05-09 LAB — CMP14+EGFR
ALT: 9 IU/L (ref 0–32)
AST: 20 IU/L (ref 0–40)
Albumin: 4.2 g/dL (ref 3.6–4.6)
Alkaline Phosphatase: 63 IU/L (ref 44–121)
BUN/Creatinine Ratio: 21 (ref 12–28)
BUN: 21 mg/dL (ref 10–36)
Bilirubin Total: 0.8 mg/dL (ref 0.0–1.2)
CO2: 24 mmol/L (ref 20–29)
Calcium: 10.1 mg/dL (ref 8.7–10.3)
Chloride: 103 mmol/L (ref 96–106)
Creatinine, Ser: 0.98 mg/dL (ref 0.57–1.00)
Globulin, Total: 2.7 g/dL (ref 1.5–4.5)
Glucose: 108 mg/dL — ABNORMAL HIGH (ref 70–99)
Potassium: 3.7 mmol/L (ref 3.5–5.2)
Sodium: 143 mmol/L (ref 134–144)
Total Protein: 6.9 g/dL (ref 6.0–8.5)
eGFR: 54 mL/min/{1.73_m2} — ABNORMAL LOW (ref >59)

## 2023-05-09 LAB — CBC WITH DIFFERENTIAL/PLATELET
Basophils Absolute: 0 10*3/uL (ref 0.0–0.2)
Basos: 0 %
EOS (ABSOLUTE): 0.1 10*3/uL (ref 0.0–0.4)
Eos: 2 %
Hematocrit: 40.6 % (ref 34.0–46.6)
Hemoglobin: 12.8 g/dL (ref 11.1–15.9)
Immature Grans (Abs): 0 10*3/uL (ref 0.0–0.1)
Immature Granulocytes: 0 %
Lymphocytes Absolute: 2 10*3/uL (ref 0.7–3.1)
Lymphs: 21 %
MCH: 23.2 pg — ABNORMAL LOW (ref 26.6–33.0)
MCHC: 31.5 g/dL (ref 31.5–35.7)
MCV: 74 fL — ABNORMAL LOW (ref 79–97)
Monocytes Absolute: 0.7 10*3/uL (ref 0.1–0.9)
Monocytes: 7 %
Neutrophils Absolute: 6.7 10*3/uL (ref 1.4–7.0)
Neutrophils: 70 %
Platelets: 254 10*3/uL (ref 150–450)
RBC: 5.52 x10E6/uL — ABNORMAL HIGH (ref 3.77–5.28)
RDW: 16.7 % — ABNORMAL HIGH (ref 11.7–15.4)
WBC: 9.5 10*3/uL (ref 3.4–10.8)

## 2023-05-09 LAB — HEMOGLOBIN A1C
Est. average glucose Bld gHb Est-mCnc: 123 mg/dL
Hgb A1c MFr Bld: 5.9 % — ABNORMAL HIGH (ref 4.8–5.6)

## 2023-05-09 LAB — LIPID PANEL
Chol/HDL Ratio: 2.1 ratio (ref 0.0–4.4)
Cholesterol, Total: 130 mg/dL (ref 100–199)
HDL: 62 mg/dL (ref >39)
LDL Chol Calc (NIH): 56 mg/dL (ref 0–99)
Triglycerides: 51 mg/dL (ref 0–149)
VLDL Cholesterol Cal: 12 mg/dL (ref 5–40)

## 2023-05-09 LAB — TSH+FREE T4
Free T4: 1.25 ng/dL (ref 0.82–1.77)
TSH: 1.37 u[IU]/mL (ref 0.450–4.500)

## 2023-05-09 LAB — VITAMIN D 25 HYDROXY (VIT D DEFICIENCY, FRACTURES): Vit D, 25-Hydroxy: 38.1 ng/mL (ref 30.0–100.0)

## 2023-05-09 MED ORDER — RABEPRAZOLE SODIUM 20 MG PO TBEC: 20.0000 mg | DELAYED_RELEASE_TABLET | Freq: Every day | ORAL | 3 refills | Status: AC

## 2023-05-16 ENCOUNTER — Other Ambulatory Visit: Payer: Self-pay | Admitting: Family Medicine

## 2023-05-21 ENCOUNTER — Ambulatory Visit: Payer: Self-pay

## 2023-05-21 NOTE — Telephone Encounter (Signed)
  Chief Complaint: hip pain s/p fall Symptoms: patient tripped in her room last night and fell on right hip. No obvious deformity or swelling Frequency: occurred last night Pertinent Negatives: Patient denies LOC, CP, SOB Disposition: [] ED /[] Urgent Care (no appt availability in office) / [] Appointment(In office/virtual)/ []  [facility]/ [x] Home Care/ [] Refused Recommended Disposition /[] [facility]/ []  Follow-up with PCP Additional Notes: patient's aide Landa Pine called to report patient falling last night in her bedroom. Denies LOC. Patient stated she tripped and fell on hardwood floor. Complained of pain to her right hip. Landa Pine states no obvious signs of swelling to the hip. Patient reports soreness to hip. Patient is heard saying she doesn't need to be seen. Landa Pine denies any swelling or deformity to the hip. Instructed for patient to be watched carefully. If patient is unable to get up and put pressure on leg or if pain becomes unbearable, recommended to ED. Nikki verbalized understanding of plan and all questions answered.    Copied from CRM [id number]. Topic: Clinical - Red Word Triage >> May 21, 2023 11:07 AM Ivette P wrote: [facility] that prompted transfer to Nurse Triage: Pt feel and slipped last night, aide Nikki on the phone.   Pt was holding her hip. Reason for Disposition  [1] Recent fall AND [2] no injury  Answer Assessment - Initial Assessment Questions 1. MECHANISM: "How did the fall happen?"     Fall occurred last night-reports that patient tripped and fell in her room which is hard woood.  2. DOMESTIC VIOLENCE AND ELDER ABUSE SCREENING: "Did you fall because someone pushed you or tried to hurt you?" If Yes, ask: "Are you safe now?"     No 3. ONSET: "When did the fall happen?" (e.g., minutes, hours, or days ago)     Occurred last night 4. LOCATION: "What part of the body hit the ground?" (e.g., back, buttocks, head, hips, knees, hands, head, stomach)     Left  hip 5. INJURY: "Did you hurt (injure) yourself when you fell?" If Yes, ask: "What did you injure? Tell me more about this?" (e.g., body area; type of injury; pain severity)"     unsure 6. PAIN: "Is there any pain?" If Yes, ask: "How bad is the pain?" (e.g., Scale 1-10; or mild,  moderate, severe)   - NONE (0): No pain   - MILD (1-3): Doesn't interfere with normal activities    - MODERATE (4-7): Interferes with normal activities or awakens from sleep    - SEVERE (8-10): Excruciating pain, unable to do any normal activities      Moderate pain 7. SIZE: For cuts, bruises, or swelling, ask: "How large is it?" (e.g., inches or centimeters)      N/A 9. OTHER SYMPTOMS: "Do you have any other symptoms?" (e.g., dizziness, fever, weakness; new onset or worsening).      no 10. CAUSE: "What do you think caused the fall (or falling)?" (e.g., tripped, dizzy spell)       tripped  Protocols used: Falls and [facility]

## 2023-09-19 DIAGNOSIS — L0291 Cutaneous abscess, unspecified: Secondary | ICD-10-CM | POA: Diagnosis not present

## 2023-09-24 ENCOUNTER — Ambulatory Visit (INDEPENDENT_AMBULATORY_CARE_PROVIDER_SITE_OTHER): Payer: Self-pay | Admitting: Internal Medicine

## 2023-09-24 ENCOUNTER — Encounter: Payer: Self-pay | Admitting: Internal Medicine

## 2023-09-24 VITALS — BP 128/72 | HR 76 | Ht 63.0 in | Wt 97.6 lb

## 2023-09-24 DIAGNOSIS — K644 Residual hemorrhoidal skin tags: Secondary | ICD-10-CM | POA: Diagnosis not present

## 2023-09-24 DIAGNOSIS — L97511 Non-pressure chronic ulcer of other part of right foot limited to breakdown of skin: Secondary | ICD-10-CM | POA: Diagnosis not present

## 2023-09-24 MED ORDER — HYDROCORTISONE (PERIANAL) 2.5 % EX CREA: 1.0000 | TOPICAL_CREAM | Freq: Two times a day (BID) | CUTANEOUS | 0 refills | Status: AC

## 2023-09-24 MED ORDER — MUPIROCIN 2 % EX OINT: 1.0000 | TOPICAL_OINTMENT | Freq: Two times a day (BID) | CUTANEOUS | 0 refills | Status: AC

## 2023-09-24 NOTE — Progress Notes (Signed)
 Acute Office Visit  Subjective:    Patient ID: Sheila Myers, female    DOB: Mar 30, 1928, 89 y.o.   MRN: [id number]  Chief Complaint  Patient presents with   Hemorrhoids    Sx of hemorrhoids.    Foot Swelling    Reports sx of right foot pain.     HPI Patient is in today for complaint of rectal soreness due to hemorrhoids, which is intermittent.  Denies any rectal bleeding currently.  Denies chronic constipation, but he has BM every 2-3 days. she has not used any OTC cream for it.  She also reports pain due to right foot ulcer on the lateral border.  She went to urgent care in [place] about 3 weeks ago after a fall leading to bruising, was given antibiotic.  She went to urgent care again about 2 weeks ago for persistent soreness, was given oral antibiotic again.  She has noticed improvement in the pain and swelling currently.  Denies any local bleeding or discharge.  Her caregiver has been placing dressing regularly.  Past Medical History:  Diagnosis Date   Anxiety    ASCVD (arteriosclerotic cardiovascular disease)    H/o PCI   Asthma    Chronic back pain    Chronic obstructive pulmonary disease (HCC)    Degenerative joint disease    Right shoulder pain-mechanism unclear   Depression    Diabetes mellitus type II    Gastroesophageal reflux disease    Diverticulosis, hemorrhoids with rectal bleeding,   Glaucoma    Glaucoma    Hyperlipidemia    Hypertension    Irritable bowel syndrome    constipation alternating with diarrhea   Stroke (HCC) 2017   TIA    Past Surgical History:  Procedure Laterality Date   ABDOMINAL HYSTERECTOMY     APPENDECTOMY     CATARACT EXTRACTION W/PHACO Right 09/28/2021   Procedure: CATARACT EXTRACTION PHACO AND INTRAOCULAR LENS PLACEMENT (IOC);  Surgeon: Harrie Agent, MD;  Location: [facility];  Service: Ophthalmology;  Laterality: Right;  27.44   CHOLECYSTECTOMY     COLONOSCOPY WITH ESOPHAGOGASTRODUODENOSCOPY (EGD)  2007   Dr. Harvey:  diverticulosis, external hemorrhoids, hiatal hernia, patent Schatzki ring.   CORONARY ANGIOPLASTY WITH STENT PLACEMENT  1991   ESOPHAGOGASTRODUODENOSCOPY  2012   Dr. harvey: Peptic stricture versus ring status post dilation, gastric inflammation and polyps,  biopsies unremarkable.   ESOPHAGOGASTRODUODENOSCOPY (EGD) WITH PROPOFOL  N/A 08/19/2017   Procedure: ESOPHAGOGASTRODUODENOSCOPY (EGD) WITH PROPOFOL ;  Surgeon: Harvey Margo CROME, MD;  Location: [facility];  Service: Endoscopy;  Laterality: N/A;  2:15pm   SAVORY DILATION N/A 08/19/2017   Procedure: SAVORY DILATION;  Surgeon: Harvey Margo CROME, MD;  Location: [facility];  Service: Endoscopy;  Laterality: N/A;    Family History  Problem Relation Age of Onset   Aneurysm Mother        Cerebral   Hypertension Mother    Breast cancer Sister    Breast cancer Sister    Colon cancer Neg Hx    Colon polyps Neg Hx     Social History   Socioeconomic History   Marital status: Widowed    Spouse name: Not on file   Number of children: Not on file   Years of education: Not on file   Highest education level: Not on file  Occupational History   Occupation: Retired CNA    Comment: [employer]  Tobacco Use   Smoking status: Never   Smokeless tobacco: Never  Vaping  Use   Vaping status: Never Used  Substance and Sexual Activity   Alcohol use: No   Drug use: No   Sexual activity: Never  Other Topics Concern   Not on file  Social History Narrative   Not on file   Social Drivers of Health   Financial Resource Strain: Not on file  Food Insecurity: Not on file  Transportation Needs: Not on file  Physical Activity: Not on file  Stress: Not on file  Social Connections: Not on file  Intimate Partner Violence: Not on file    Outpatient Medications Prior to Visit  Medication Sig Dispense Refill   albuterol  (VENTOLIN  HFA) 108 (90 Base) MCG/ACT inhaler Inhale 1-2 puffs into the lungs every 6 (six) hours as needed for wheezing or  shortness of breath. 18 g 0   alendronate  (FOSAMAX ) 70 MG tablet Take 1 tablet (70 mg total) by mouth once a week. Take 70 mg by mouth once a week. 12 tablet 10   amLODipine -benazepril  (LOTREL) 10-20 MG capsule Take 1 capsule by mouth daily. 90 capsule 1   atorvastatin  (LIPITOR) 20 MG tablet Take 1 tablet (20 mg total) by mouth at bedtime. 30 tablet 2   bimatoprost (LUMIGAN) 0.03 % ophthalmic drops Place 1 drop into both eyes at bedtime.     fluticasone -salmeterol (ADVAIR) 100-50 MCG/ACT AEPB Inhale 1 puff into the lungs 2 (two) times daily. 1 each 3   furosemide  (LASIX ) 20 MG tablet Take 1 tablet (20 mg total) by mouth as needed. 20 tablet 1   HYDROcodone -acetaminophen  (NORCO) 10-325 MG tablet Take 1 tablet by mouth 2 (two) times daily as needed for moderate pain (pain score 4-6). 60 tablet 0   RABEprazole  (ACIPHEX ) 20 MG tablet Take 1 tablet (20 mg total) by mouth daily after breakfast. 30 tablet 3   tiZANidine  (ZANAFLEX ) 4 MG tablet Take 1 tablet (4 mg total) by mouth every 12 (twelve) hours as needed for muscle spasms. 60 tablet 3   Vitamin D , Ergocalciferol , (DRISDOL ) 1.25 MG (50000 UNIT) CAPS capsule TAKE 1 CAPSULE BY MOUTH EVERY 7 DAYS 12 capsule 3   No facility-administered medications prior to visit.    Allergies  Allergen Reactions   Peanut-Containing Drug Products Anaphylaxis, Itching and Rash    Allergy not confirmed-patient also mentions a rash and itching when eating peanut butter   Catapres  [Clonidine  Hcl] Other (See Comments)    Dry mouth   Codeine Other (See Comments)    I just freak out   Morphine Other (See Comments)    I just freak out    Review of Systems  Constitutional:  Negative for chills and fever.  HENT:  Negative for congestion and sore throat.   Eyes:  Negative for pain and discharge.  Respiratory:  Negative for cough and shortness of breath.   Cardiovascular:  Negative for chest pain and palpitations.  Gastrointestinal:  Negative for abdominal pain,  diarrhea, nausea and vomiting.  Endocrine: Negative for polydipsia and polyuria.  Genitourinary:  Negative for dysuria and hematuria.  Musculoskeletal:  Negative for neck pain and neck stiffness.  Skin:  Negative for rash.  Neurological:  Positive for weakness. Negative for dizziness.  Psychiatric/Behavioral:  Negative for agitation and behavioral problems.        Objective:    Physical Exam Vitals reviewed.  Constitutional:      General: She is not in acute distress.    Appearance: She is not diaphoretic.     Comments: In wheelchair  HENT:  Head: Normocephalic and atraumatic.     Nose: Nose normal.     Mouth/Throat:     Mouth: Mucous membranes are moist.  Eyes:     General: No scleral icterus.    Extraocular Movements: Extraocular movements intact.  Cardiovascular:     Rate and Rhythm: Normal rate and regular rhythm.     Heart sounds: Normal heart sounds. No murmur heard. Pulmonary:     Breath sounds: Normal breath sounds. No wheezing or rales.  Musculoskeletal:     Cervical back: Neck supple. No tenderness.     Right lower leg: No edema.     Left lower leg: No edema.  Skin:    General: Skin is warm.     Findings: No rash.     Comments: About 1 cm in diameter ulcer on lateral border of right foot, limited to skin breakdown  Neurological:     General: No focal deficit present.     Mental Status: She is alert and oriented to person, place, and time.  Psychiatric:        Mood and Affect: Mood normal.        Behavior: Behavior normal.     BP 128/72 (BP Location: Left Arm)   Pulse 76   Ht 5' 3 (1.6 m)   Wt 97 lb 9.6 oz (44.3 kg)   SpO2 99%   BMI 17.29 kg/m  Wt Readings from Last 3 Encounters:  09/24/23 97 lb 9.6 oz (44.3 kg)  05/08/23 105 lb 0.6 oz (47.6 kg)  01/01/23 110 lb 1.9 oz (50 kg)        Assessment & Plan:   Problem List Items Addressed This Visit    Visit Diagnoses       External hemorrhoid    -  Primary Anusol  cream prescribed Advised  to maintain adequate hydration and take Colace as needed for constipation Advised to contact if she has melena or hematochezia   Relevant Medications   hydrocortisone  (ANUSOL -HC) 2.5 % rectal cream     Foot ulcer, right, limited to breakdown of skin (HCC)     Likely related to bruising from the fall Has completed oral antibiotics already, healing well Prescribed mupirocin  ointment for local bacterial PPx   Relevant Medications   mupirocin  ointment (BACTROBAN ) 2 %        Meds ordered this encounter  Medications   mupirocin  ointment (BACTROBAN ) 2 %    Sig: Apply 1 Application topically 2 (two) times daily.    Dispense:  22 g    Refill:  0   hydrocortisone  (ANUSOL -HC) 2.5 % rectal cream    Sig: Place 1 Application rectally 2 (two) times daily.    Dispense:  30 g    Refill:  0     Lavarius Doughten MARLA Blanch, MD

## 2023-09-24 NOTE — Patient Instructions (Signed)
 Please apply Anusol  cream over hemorrhoid area.  Please take Colace or Dulcolax as needed for constipation.  Please maintain at least 50 ounces of fluid intake in a day.  Please apply mupirocin  ointment over the right foot ulcer area.

## 2023-10-15 ENCOUNTER — Other Ambulatory Visit (HOSPITAL_COMMUNITY): Payer: Self-pay

## 2023-10-30 ENCOUNTER — Inpatient Hospital Stay (HOSPITAL_COMMUNITY)

## 2023-10-30 ENCOUNTER — Emergency Department (HOSPITAL_COMMUNITY)

## 2023-10-30 ENCOUNTER — Inpatient Hospital Stay (HOSPITAL_COMMUNITY): Admitting: Registered Nurse

## 2023-10-30 ENCOUNTER — Inpatient Hospital Stay (HOSPITAL_COMMUNITY)
Admission: EM | Admit: 2023-10-30 | Discharge: 2023-11-29 | DRG: 023 | Disposition: E | Attending: Neurology | Admitting: Neurology

## 2023-10-30 ENCOUNTER — Encounter (HOSPITAL_COMMUNITY): Admission: EM | Disposition: E | Payer: Self-pay | Source: Home / Self Care | Attending: Neurology

## 2023-10-30 DIAGNOSIS — W19XXXA Unspecified fall, initial encounter: Secondary | ICD-10-CM

## 2023-10-30 DIAGNOSIS — I639 Cerebral infarction, unspecified: Principal | ICD-10-CM | POA: Diagnosis present

## 2023-10-30 DIAGNOSIS — I10 Essential (primary) hypertension: Secondary | ICD-10-CM

## 2023-10-30 DIAGNOSIS — Z515 Encounter for palliative care: Secondary | ICD-10-CM | POA: Diagnosis not present

## 2023-10-30 DIAGNOSIS — J449 Chronic obstructive pulmonary disease, unspecified: Secondary | ICD-10-CM | POA: Diagnosis present

## 2023-10-30 DIAGNOSIS — I6389 Other cerebral infarction: Secondary | ICD-10-CM | POA: Diagnosis not present

## 2023-10-30 DIAGNOSIS — F32A Depression, unspecified: Secondary | ICD-10-CM | POA: Diagnosis present

## 2023-10-30 DIAGNOSIS — Z955 Presence of coronary angioplasty implant and graft: Secondary | ICD-10-CM

## 2023-10-30 DIAGNOSIS — Z8673 Personal history of transient ischemic attack (TIA), and cerebral infarction without residual deficits: Secondary | ICD-10-CM

## 2023-10-30 DIAGNOSIS — E870 Hyperosmolality and hypernatremia: Secondary | ICD-10-CM | POA: Diagnosis not present

## 2023-10-30 DIAGNOSIS — M47812 Spondylosis without myelopathy or radiculopathy, cervical region: Secondary | ICD-10-CM | POA: Diagnosis not present

## 2023-10-30 DIAGNOSIS — M4802 Spinal stenosis, cervical region: Secondary | ICD-10-CM | POA: Diagnosis not present

## 2023-10-30 DIAGNOSIS — I63411 Cerebral infarction due to embolism of right middle cerebral artery: Principal | ICD-10-CM | POA: Diagnosis present

## 2023-10-30 DIAGNOSIS — E876 Hypokalemia: Secondary | ICD-10-CM | POA: Diagnosis not present

## 2023-10-30 DIAGNOSIS — Z043 Encounter for examination and observation following other accident: Secondary | ICD-10-CM | POA: Diagnosis not present

## 2023-10-30 DIAGNOSIS — Z4682 Encounter for fitting and adjustment of non-vascular catheter: Secondary | ICD-10-CM | POA: Diagnosis not present

## 2023-10-30 DIAGNOSIS — I252 Old myocardial infarction: Secondary | ICD-10-CM

## 2023-10-30 DIAGNOSIS — Z66 Do not resuscitate: Secondary | ICD-10-CM | POA: Diagnosis not present

## 2023-10-30 DIAGNOSIS — I63521 Cerebral infarction due to unspecified occlusion or stenosis of right anterior cerebral artery: Secondary | ICD-10-CM | POA: Diagnosis not present

## 2023-10-30 DIAGNOSIS — N1411 Contrast-induced nephropathy: Secondary | ICD-10-CM | POA: Diagnosis not present

## 2023-10-30 DIAGNOSIS — I63231 Cerebral infarction due to unspecified occlusion or stenosis of right carotid arteries: Secondary | ICD-10-CM | POA: Diagnosis not present

## 2023-10-30 DIAGNOSIS — J9601 Acute respiratory failure with hypoxia: Secondary | ICD-10-CM | POA: Diagnosis not present

## 2023-10-30 DIAGNOSIS — M5021 Other cervical disc displacement,  high cervical region: Secondary | ICD-10-CM | POA: Diagnosis not present

## 2023-10-30 DIAGNOSIS — T508X5A Adverse effect of diagnostic agents, initial encounter: Secondary | ICD-10-CM | POA: Diagnosis not present

## 2023-10-30 DIAGNOSIS — I63429 Cerebral infarction due to embolism of unspecified anterior cerebral artery: Secondary | ICD-10-CM | POA: Diagnosis present

## 2023-10-30 DIAGNOSIS — J9811 Atelectasis: Secondary | ICD-10-CM | POA: Diagnosis not present

## 2023-10-30 DIAGNOSIS — I63511 Cerebral infarction due to unspecified occlusion or stenosis of right middle cerebral artery: Secondary | ICD-10-CM | POA: Diagnosis not present

## 2023-10-30 DIAGNOSIS — G8929 Other chronic pain: Secondary | ICD-10-CM | POA: Diagnosis present

## 2023-10-30 DIAGNOSIS — I63311 Cerebral infarction due to thrombosis of right middle cerebral artery: Secondary | ICD-10-CM

## 2023-10-30 DIAGNOSIS — R1310 Dysphagia, unspecified: Secondary | ICD-10-CM | POA: Diagnosis not present

## 2023-10-30 DIAGNOSIS — R29732 NIHSS score 32: Secondary | ICD-10-CM | POA: Diagnosis not present

## 2023-10-30 DIAGNOSIS — F419 Anxiety disorder, unspecified: Secondary | ICD-10-CM | POA: Diagnosis present

## 2023-10-30 DIAGNOSIS — N170 Acute kidney failure with tubular necrosis: Secondary | ICD-10-CM | POA: Diagnosis not present

## 2023-10-30 DIAGNOSIS — I779 Disorder of arteries and arterioles, unspecified: Secondary | ICD-10-CM | POA: Diagnosis not present

## 2023-10-30 DIAGNOSIS — K551 Chronic vascular disorders of intestine: Secondary | ICD-10-CM | POA: Diagnosis not present

## 2023-10-30 DIAGNOSIS — E1151 Type 2 diabetes mellitus with diabetic peripheral angiopathy without gangrene: Secondary | ICD-10-CM | POA: Diagnosis not present

## 2023-10-30 DIAGNOSIS — H409 Unspecified glaucoma: Secondary | ICD-10-CM | POA: Diagnosis not present

## 2023-10-30 DIAGNOSIS — G936 Cerebral edema: Secondary | ICD-10-CM | POA: Diagnosis present

## 2023-10-30 DIAGNOSIS — K219 Gastro-esophageal reflux disease without esophagitis: Secondary | ICD-10-CM

## 2023-10-30 DIAGNOSIS — I701 Atherosclerosis of renal artery: Secondary | ICD-10-CM | POA: Diagnosis not present

## 2023-10-30 DIAGNOSIS — G9340 Encephalopathy, unspecified: Secondary | ICD-10-CM | POA: Diagnosis not present

## 2023-10-30 DIAGNOSIS — E785 Hyperlipidemia, unspecified: Secondary | ICD-10-CM

## 2023-10-30 DIAGNOSIS — E1165 Type 2 diabetes mellitus with hyperglycemia: Secondary | ICD-10-CM | POA: Diagnosis not present

## 2023-10-30 DIAGNOSIS — R9089 Other abnormal findings on diagnostic imaging of central nervous system: Secondary | ICD-10-CM | POA: Diagnosis not present

## 2023-10-30 DIAGNOSIS — R001 Bradycardia, unspecified: Secondary | ICD-10-CM | POA: Diagnosis not present

## 2023-10-30 DIAGNOSIS — S299XXA Unspecified injury of thorax, initial encounter: Secondary | ICD-10-CM | POA: Diagnosis not present

## 2023-10-30 DIAGNOSIS — Y92009 Unspecified place in unspecified non-institutional (private) residence as the place of occurrence of the external cause: Secondary | ICD-10-CM | POA: Diagnosis not present

## 2023-10-30 DIAGNOSIS — R29729 NIHSS score 29: Secondary | ICD-10-CM | POA: Diagnosis not present

## 2023-10-30 DIAGNOSIS — R569 Unspecified convulsions: Secondary | ICD-10-CM | POA: Diagnosis not present

## 2023-10-30 DIAGNOSIS — I70203 Unspecified atherosclerosis of native arteries of extremities, bilateral legs: Secondary | ICD-10-CM | POA: Diagnosis not present

## 2023-10-30 DIAGNOSIS — R4182 Altered mental status, unspecified: Secondary | ICD-10-CM | POA: Diagnosis not present

## 2023-10-30 DIAGNOSIS — I739 Peripheral vascular disease, unspecified: Secondary | ICD-10-CM | POA: Diagnosis not present

## 2023-10-30 DIAGNOSIS — S3993XA Unspecified injury of pelvis, initial encounter: Secondary | ICD-10-CM | POA: Diagnosis not present

## 2023-10-30 DIAGNOSIS — G935 Compression of brain: Secondary | ICD-10-CM | POA: Diagnosis not present

## 2023-10-30 DIAGNOSIS — S0990XA Unspecified injury of head, initial encounter: Secondary | ICD-10-CM | POA: Diagnosis not present

## 2023-10-30 DIAGNOSIS — I69391 Dysphagia following cerebral infarction: Secondary | ICD-10-CM | POA: Diagnosis not present

## 2023-10-30 DIAGNOSIS — M47811 Spondylosis without myelopathy or radiculopathy, occipito-atlanto-axial region: Secondary | ICD-10-CM | POA: Diagnosis not present

## 2023-10-30 DIAGNOSIS — I3139 Other pericardial effusion (noninflammatory): Secondary | ICD-10-CM | POA: Diagnosis not present

## 2023-10-30 DIAGNOSIS — I6523 Occlusion and stenosis of bilateral carotid arteries: Secondary | ICD-10-CM | POA: Diagnosis present

## 2023-10-30 DIAGNOSIS — I6381 Other cerebral infarction due to occlusion or stenosis of small artery: Secondary | ICD-10-CM | POA: Diagnosis not present

## 2023-10-30 DIAGNOSIS — K589 Irritable bowel syndrome without diarrhea: Secondary | ICD-10-CM | POA: Diagnosis present

## 2023-10-30 DIAGNOSIS — I7092 Chronic total occlusion of artery of the extremities: Secondary | ICD-10-CM | POA: Diagnosis not present

## 2023-10-30 DIAGNOSIS — N179 Acute kidney failure, unspecified: Secondary | ICD-10-CM | POA: Diagnosis not present

## 2023-10-30 DIAGNOSIS — I459 Conduction disorder, unspecified: Secondary | ICD-10-CM | POA: Diagnosis present

## 2023-10-30 DIAGNOSIS — I700 Atherosclerosis of aorta: Secondary | ICD-10-CM | POA: Diagnosis not present

## 2023-10-30 DIAGNOSIS — S3991XA Unspecified injury of abdomen, initial encounter: Secondary | ICD-10-CM | POA: Diagnosis not present

## 2023-10-30 DIAGNOSIS — I6502 Occlusion and stenosis of left vertebral artery: Secondary | ICD-10-CM | POA: Diagnosis present

## 2023-10-30 DIAGNOSIS — I63541 Cerebral infarction due to unspecified occlusion or stenosis of right cerebellar artery: Secondary | ICD-10-CM | POA: Diagnosis not present

## 2023-10-30 HISTORY — PX: IR CT HEAD LTD: IMG2386

## 2023-10-30 HISTORY — PX: RADIOLOGY WITH ANESTHESIA: SHX6223

## 2023-10-30 HISTORY — PX: IR PERCUTANEOUS ART THROMBECTOMY/INFUSION INTRACRANIAL INC DIAG ANGIO: IMG6087

## 2023-10-30 HISTORY — PX: IR US GUIDE VASC ACCESS RIGHT: IMG2390

## 2023-10-30 LAB — CBC WITH DIFFERENTIAL/PLATELET
Abs Immature Granulocytes: 0.02 K/uL (ref 0.00–0.07)
Basophils Absolute: 0 K/uL (ref 0.0–0.1)
Basophils Relative: 1 %
Eosinophils Absolute: 0.1 K/uL (ref 0.0–0.5)
Eosinophils Relative: 2 %
HCT: 32.1 % — ABNORMAL LOW (ref 36.0–46.0)
Hemoglobin: 10.1 g/dL — ABNORMAL LOW (ref 12.0–15.0)
Immature Granulocytes: 0 %
Lymphocytes Relative: 14 %
Lymphs Abs: 0.9 K/uL (ref 0.7–4.0)
MCH: 22.6 pg — ABNORMAL LOW (ref 26.0–34.0)
MCHC: 31.5 g/dL (ref 30.0–36.0)
MCV: 72 fL — ABNORMAL LOW (ref 80.0–100.0)
Monocytes Absolute: 0.4 K/uL (ref 0.1–1.0)
Monocytes Relative: 6 %
Neutro Abs: 5 K/uL (ref 1.7–7.7)
Neutrophils Relative %: 77 %
Platelets: 172 K/uL (ref 150–400)
RBC: 4.46 MIL/uL (ref 3.87–5.11)
RDW: 17.1 % — ABNORMAL HIGH (ref 11.5–15.5)
WBC: 6.5 K/uL (ref 4.0–10.5)
nRBC: 0 % (ref 0.0–0.2)

## 2023-10-30 LAB — COMPREHENSIVE METABOLIC PANEL WITH GFR
ALT: 20 U/L (ref 0–44)
AST: 20 U/L (ref 15–41)
Albumin: 2.8 g/dL — ABNORMAL LOW (ref 3.5–5.0)
Alkaline Phosphatase: 44 U/L (ref 38–126)
Anion gap: 11 (ref 5–15)
BUN: 18 mg/dL (ref 8–23)
CO2: 21 mmol/L — ABNORMAL LOW (ref 22–32)
Calcium: 8.2 mg/dL — ABNORMAL LOW (ref 8.9–10.3)
Chloride: 106 mmol/L (ref 98–111)
Creatinine, Ser: 0.78 mg/dL (ref 0.44–1.00)
GFR, Estimated: >60 mL/min (ref >60)
Glucose, Bld: 124 mg/dL — ABNORMAL HIGH (ref 70–99)
Potassium: 2.8 mmol/L — ABNORMAL LOW (ref 3.5–5.1)
Sodium: 138 mmol/L (ref 135–145)
Total Bilirubin: 0.7 mg/dL (ref 0.0–1.2)
Total Protein: 5.5 g/dL — ABNORMAL LOW (ref 6.5–8.1)

## 2023-10-30 LAB — I-STAT ARTERIAL BLOOD GAS, ED
Acid-Base Excess: 0 mmol/L (ref 0.0–2.0)
Bicarbonate: 25.8 mmol/L (ref 20.0–28.0)
Calcium, Ion: 1.28 mmol/L (ref 1.15–1.40)
HCT: 31 % — ABNORMAL LOW (ref 36.0–46.0)
Hemoglobin: 10.5 g/dL — ABNORMAL LOW (ref 12.0–15.0)
O2 Saturation: 100 %
Patient temperature: 96.5
Potassium: 3.1 mmol/L — ABNORMAL LOW (ref 3.5–5.1)
Sodium: 143 mmol/L (ref 135–145)
TCO2: 27 mmol/L (ref 22–32)
pCO2 arterial: 45.4 mmHg (ref 32–48)
pH, Arterial: 7.358 (ref 7.35–7.45)
pO2, Arterial: 337 mmHg — ABNORMAL HIGH (ref 83–108)

## 2023-10-30 LAB — BASIC METABOLIC PANEL WITH GFR
Anion gap: 9 (ref 5–15)
BUN: 17 mg/dL (ref 8–23)
CO2: 23 mmol/L (ref 22–32)
Calcium: 8.7 mg/dL — ABNORMAL LOW (ref 8.9–10.3)
Chloride: 109 mmol/L (ref 98–111)
Creatinine, Ser: 0.8 mg/dL (ref 0.44–1.00)
GFR, Estimated: >60 mL/min (ref >60)
Glucose, Bld: 116 mg/dL — ABNORMAL HIGH (ref 70–99)
Potassium: 3.3 mmol/L — ABNORMAL LOW (ref 3.5–5.1)
Sodium: 141 mmol/L (ref 135–145)

## 2023-10-30 LAB — I-STAT CG4 LACTIC ACID, ED: Lactic Acid, Venous: 1.4 mmol/L (ref 0.5–1.9)

## 2023-10-30 LAB — I-STAT CHEM 8, ED
BUN: 20 mg/dL (ref 8–23)
Calcium, Ion: 1.17 mmol/L (ref 1.15–1.40)
Chloride: 104 mmol/L (ref 98–111)
Creatinine, Ser: 0.8 mg/dL (ref 0.44–1.00)
Glucose, Bld: 125 mg/dL — ABNORMAL HIGH (ref 70–99)
HCT: 31 % — ABNORMAL LOW (ref 36.0–46.0)
Hemoglobin: 10.5 g/dL — ABNORMAL LOW (ref 12.0–15.0)
Potassium: 2.9 mmol/L — ABNORMAL LOW (ref 3.5–5.1)
Sodium: 142 mmol/L (ref 135–145)
TCO2: 24 mmol/L (ref 22–32)

## 2023-10-30 LAB — HEMOGLOBIN A1C
Hgb A1c MFr Bld: 5.7 % — ABNORMAL HIGH (ref 4.8–5.6)
Mean Plasma Glucose: 116.89 mg/dL

## 2023-10-30 LAB — DIFFERENTIAL
Abs Immature Granulocytes: 0.04 K/uL (ref 0.00–0.07)
Basophils Absolute: 0 K/uL (ref 0.0–0.1)
Basophils Relative: 0 %
Eosinophils Absolute: 0.1 K/uL (ref 0.0–0.5)
Eosinophils Relative: 1 %
Immature Granulocytes: 1 %
Lymphocytes Relative: 9 %
Lymphs Abs: 0.8 K/uL (ref 0.7–4.0)
Monocytes Absolute: 0.5 K/uL (ref 0.1–1.0)
Monocytes Relative: 6 %
Neutro Abs: 7 K/uL (ref 1.7–7.7)
Neutrophils Relative %: 83 %

## 2023-10-30 LAB — URINALYSIS, ROUTINE W REFLEX MICROSCOPIC
Bilirubin Urine: NEGATIVE
Glucose, UA: NEGATIVE mg/dL
Ketones, ur: NEGATIVE mg/dL
Leukocytes,Ua: NEGATIVE
Nitrite: NEGATIVE
Protein, ur: NEGATIVE mg/dL
Specific Gravity, Urine: 1.028 (ref 1.005–1.030)
pH: 5 (ref 5.0–8.0)

## 2023-10-30 LAB — SAMPLE TO BLOOD BANK

## 2023-10-30 LAB — CBC
HCT: 32.7 % — ABNORMAL LOW (ref 36.0–46.0)
Hemoglobin: 10 g/dL — ABNORMAL LOW (ref 12.0–15.0)
MCH: 22.2 pg — ABNORMAL LOW (ref 26.0–34.0)
MCHC: 30.6 g/dL (ref 30.0–36.0)
MCV: 72.7 fL — ABNORMAL LOW (ref 80.0–100.0)
Platelets: 175 K/uL (ref 150–400)
RBC: 4.5 MIL/uL (ref 3.87–5.11)
RDW: 16.8 % — ABNORMAL HIGH (ref 11.5–15.5)
WBC: 6.5 K/uL (ref 4.0–10.5)
nRBC: 0 % (ref 0.0–0.2)

## 2023-10-30 LAB — PROTIME-INR
INR: 1.4 — ABNORMAL HIGH (ref 0.8–1.2)
Prothrombin Time: 17.5 s — ABNORMAL HIGH (ref 11.4–15.2)

## 2023-10-30 LAB — MRSA NEXT GEN BY PCR, NASAL: MRSA by PCR Next Gen: NOT DETECTED

## 2023-10-30 LAB — CBG MONITORING, ED: Glucose-Capillary: 110 mg/dL — ABNORMAL HIGH (ref 70–99)

## 2023-10-30 LAB — ETHANOL: Alcohol, Ethyl (B): <15 mg/dL (ref <15)

## 2023-10-30 LAB — APTT: aPTT: 29 s (ref 24–36)

## 2023-10-30 SURGERY — RADIOLOGY WITH ANESTHESIA
Anesthesia: General

## 2023-10-30 MED ORDER — ORAL CARE MOUTH RINSE: 15.0000 mL | OROMUCOSAL | Status: DC | PRN

## 2023-10-30 MED ORDER — CHLORHEXIDINE GLUCONATE CLOTH 2 % EX PADS
6.0000 | MEDICATED_PAD | Freq: Every day | CUTANEOUS | Status: DC
  Administered 2023-10-30 – 2023-11-01 (×4): 6 via TOPICAL

## 2023-10-30 MED ORDER — SODIUM CHLORIDE 0.9 % IV SOLN: INTRAVENOUS | Status: DC

## 2023-10-30 MED ORDER — IOHEXOL 300 MG/ML  SOLN
150.0000 mL | Freq: Once | INTRAMUSCULAR | Status: AC | PRN
  Administered 2023-10-30 (×2): 80 mL via INTRA_ARTERIAL

## 2023-10-30 MED ORDER — SODIUM CHLORIDE 0.9 % IV BOLUS: 250.0000 mL | INTRAVENOUS | Status: AC | PRN

## 2023-10-30 MED ORDER — IPRATROPIUM-ALBUTEROL 0.5-2.5 (3) MG/3ML IN SOLN: 3.0000 mL | Freq: Four times a day (QID) | RESPIRATORY_TRACT | Status: DC | PRN

## 2023-10-30 MED ORDER — ETOMIDATE 2 MG/ML IV SOLN
INTRAVENOUS | Status: AC | PRN
  Administered 2023-10-30 (×2): 10 mg via INTRAVENOUS

## 2023-10-30 MED ORDER — ATROPINE SULFATE 1 MG/10ML IJ SOSY
PREFILLED_SYRINGE | INTRAMUSCULAR | Status: AC
  Filled 2023-10-30: qty 10

## 2023-10-30 MED ORDER — STROKE: EARLY STAGES OF RECOVERY BOOK
Freq: Once | Status: AC
  Filled 2023-10-30: qty 1

## 2023-10-30 MED ORDER — FAMOTIDINE 20 MG PO TABS
20.0000 mg | ORAL_TABLET | Freq: Two times a day (BID) | ORAL | Status: DC
  Administered 2023-10-30 – 2023-10-31 (×4): 20 mg
  Filled 2023-10-30 (×2): qty 1

## 2023-10-30 MED ORDER — LACTATED RINGERS IV SOLN: INTRAVENOUS | Status: DC

## 2023-10-30 MED ORDER — TENECTEPLASE FOR STROKE
0.2500 mg/kg | PACK | Freq: Once | INTRAVENOUS | Status: AC
  Administered 2023-10-30 (×2): 14 mg via INTRAVENOUS
  Filled 2023-10-30: qty 10

## 2023-10-30 MED ORDER — DOCUSATE SODIUM 100 MG PO CAPS: 100.0000 mg | ORAL_CAPSULE | Freq: Two times a day (BID) | ORAL | Status: DC | PRN

## 2023-10-30 MED ORDER — MANNITOL 20 % IV SOLN
INTRAVENOUS | Status: AC
  Filled 2023-10-30: qty 500

## 2023-10-30 MED ORDER — ONDANSETRON HCL 4 MG/2ML IJ SOLN: 4.0000 mg | Freq: Four times a day (QID) | INTRAMUSCULAR | Status: DC | PRN

## 2023-10-30 MED ORDER — SODIUM CHLORIDE 3 % IV BOLUS
250.0000 mL | Freq: Once | INTRAVENOUS | Status: AC
  Administered 2023-10-30 (×2): 250 mL via INTRAVENOUS
  Filled 2023-10-30: qty 500

## 2023-10-30 MED ORDER — SODIUM CHLORIDE 0.9 % IV SOLN: INTRAVENOUS | Status: DC | PRN

## 2023-10-30 MED ORDER — IOHEXOL 350 MG/ML SOLN
80.0000 mL | Freq: Once | INTRAVENOUS | Status: AC | PRN
  Administered 2023-10-30 (×2): 80 mL via INTRAVENOUS

## 2023-10-30 MED ORDER — ORAL CARE MOUTH RINSE
15.0000 mL | OROMUCOSAL | Status: DC
  Administered 2023-10-30 – 2023-11-01 (×36): 15 mL via OROMUCOSAL

## 2023-10-30 MED ORDER — SUCCINYLCHOLINE CHLORIDE 20 MG/ML IJ SOLN
INTRAMUSCULAR | Status: AC | PRN
  Administered 2023-10-30 (×2): 100 mg via INTRAVENOUS

## 2023-10-30 MED ORDER — ACETAMINOPHEN 325 MG PO TABS: 650.0000 mg | ORAL_TABLET | ORAL | Status: DC | PRN

## 2023-10-30 MED ORDER — POTASSIUM CHLORIDE 10 MEQ/100ML IV SOLN
10.0000 meq | INTRAVENOUS | Status: AC
  Administered 2023-10-30 – 2023-10-31 (×8): 10 meq via INTRAVENOUS
  Filled 2023-10-30 (×4): qty 100

## 2023-10-30 MED ORDER — PROPOFOL 1000 MG/100ML IV EMUL
0.0000 ug/kg/min | INTRAVENOUS | Status: DC
  Administered 2023-10-30: 30 ug/kg/min via INTRAVENOUS
  Administered 2023-10-30 (×2): 20 ug/kg/min via INTRAVENOUS
  Administered 2023-10-30: 30 ug/kg/min via INTRAVENOUS
  Filled 2023-10-30: qty 100

## 2023-10-30 MED ORDER — ISOPROTERENOL HCL 0.2 MG/ML IJ SOLN
2.0000 ug/min | INTRAVENOUS | Status: DC
  Administered 2023-10-31: 6 ug/min via INTRAVENOUS
  Administered 2023-10-31: 2 ug/min via INTRAVENOUS
  Administered 2023-10-31: 6 ug/min via INTRAVENOUS
  Administered 2023-10-31: 2 ug/min via INTRAVENOUS
  Administered 2023-11-01: 3 ug/min via INTRAVENOUS
  Administered 2023-11-01 (×2): 4 ug/min via INTRAVENOUS
  Administered 2023-11-01: 3 ug/min via INTRAVENOUS
  Filled 2023-10-30 (×7): qty 5

## 2023-10-30 MED ORDER — MANNITOL 20 % IV SOLN
50.0000 g | Freq: Once | INTRAVENOUS | Status: AC
  Administered 2023-10-30 (×2): 50 g via INTRAVENOUS
  Filled 2023-10-30: qty 500

## 2023-10-30 MED ORDER — FENTANYL 2500MCG IN NS 250ML (10MCG/ML) PREMIX INFUSION
0.0000 ug/h | INTRAVENOUS | Status: DC
  Administered 2023-10-30 (×2): 100 ug/h via INTRAVENOUS
  Filled 2023-10-30: qty 250

## 2023-10-30 MED ORDER — CLEVIDIPINE BUTYRATE 0.5 MG/ML IV EMUL
0.0000 mg/h | INTRAVENOUS | Status: DC
  Administered 2023-10-30 (×6): 2 mg/h via INTRAVENOUS
  Administered 2023-10-31: 6 mg/h via INTRAVENOUS
  Administered 2023-10-31: 12 mg/h via INTRAVENOUS
  Administered 2023-10-31: 6 mg/h via INTRAVENOUS
  Administered 2023-10-31: 12 mg/h via INTRAVENOUS
  Administered 2023-11-01 (×2): 8 mg/h via INTRAVENOUS
  Filled 2023-10-30: qty 50
  Filled 2023-10-30: qty 100
  Filled 2023-10-30: qty 50

## 2023-10-30 MED ORDER — LACTATED RINGERS IV SOLN: INTRAVENOUS | Status: DC | PRN

## 2023-10-30 MED ORDER — CANGRELOR TETRASODIUM 50 MG IV SOLR
INTRAVENOUS | Status: AC
  Filled 2023-10-30: qty 50

## 2023-10-30 MED ORDER — ROCURONIUM BROMIDE 10 MG/ML (PF) SYRINGE
PREFILLED_SYRINGE | INTRAVENOUS | Status: DC | PRN
  Administered 2023-10-30 (×2): 50 mg via INTRAVENOUS

## 2023-10-30 MED ORDER — POLYETHYLENE GLYCOL 3350 17 G PO PACK: 17.0000 g | PACK | Freq: Every day | ORAL | Status: DC | PRN

## 2023-10-30 MED ORDER — FENTANYL BOLUS VIA INFUSION
25.0000 ug | INTRAVENOUS | Status: DC | PRN
  Administered 2023-10-30: 100 ug via INTRAVENOUS
  Administered 2023-10-30: 50 ug via INTRAVENOUS
  Administered 2023-10-30 (×2): 100 ug via INTRAVENOUS
  Administered 2023-10-30: 50 ug via INTRAVENOUS
  Administered 2023-10-30: 100 ug via INTRAVENOUS

## 2023-10-30 MED ORDER — PANTOPRAZOLE SODIUM 40 MG IV SOLR
40.0000 mg | Freq: Every day | INTRAVENOUS | Status: DC
Start: 2023-10-30 — End: 2023-10-31
  Administered 2023-10-30 (×2): 40 mg via INTRAVENOUS
  Filled 2023-10-30: qty 10

## 2023-10-30 MED ORDER — NITROGLYCERIN 1 MG/10 ML FOR IR/CATH LAB
INTRA_ARTERIAL | Status: AC
Start: 2023-10-30 — End: 2023-10-30
  Filled 2023-10-30: qty 10

## 2023-10-30 MED ORDER — STERILE WATER FOR INJECTION IJ SOLN
INTRAMUSCULAR | Status: AC
Start: 2023-10-30 — End: 2023-10-30
  Filled 2023-10-30: qty 10

## 2023-10-30 NOTE — Consult Note (Deleted)
 Informed that patient had persistent bradycardia (to 30's), increased blood pressure, widening pulse pressure and therefore was sent for STAT CTH by CCM. CTH with edema as well as minimal subarachnoid hyperdensity. CTH unfortunately not dual energy, therefore possibly contrast rather than hemorrhage.   CCM administered atropine for bradycardia.  Loaded with mannitol and 3% HTS for cerebral edema. Na q6h. Patient difficult stick therefore a-line placed.  Later informed that patient did not have pulses even with Doppler. Dr. Ray was paged and recommended CTA which confirmed that femoral access site was still open. See Dr. Edd note re: inability to detect signals in feet, however can proximally. Pending repeat exam.  Normie Blower, MD [facility]

## 2023-10-30 NOTE — Progress Notes (Signed)
 eLink Physician-Brief Progress Note Patient Name: Sheila Myers DOB: [birth year] MRN: [id number]   Date of Service  10/30/2023  HPI/Events of Note  89 yo F  with PMH prior stroke presented after a fall at home where she hit her head on the sink, no LOC.  She was intubated in the ED. Initially worked up as a trauma but it was discovered that she has s R MCA stroke. CTA neck w occluded R ICA, 70% stenosed prox L ICA and vertebral artery stenosis .  Received TNK and thrombectomy in IR.  CC team consulted to assist with management.  eICU Interventions  Chart reviewed     Intervention Category Evaluation Type: New Patient Evaluation  Sheila Myers, P 10/30/2023, 10:01 PM

## 2023-10-30 NOTE — Progress Notes (Signed)
 Patient transported to 4N24 on the vent on 100% from IR accompanied with RN, CRNA, and MD. No complications noted, uneventful trip.   Vina Byrd L. Claudene, BS, RRT-ACCS, RCP

## 2023-10-30 NOTE — Progress Notes (Signed)
 eLink Physician-Brief Progress Note Patient Name: Sheila Myers DOB: Apr 11, 1928 MRN: [id number]   Date of Service  10/30/2023  HPI/Events of Note  Called urgently by bedside nurse for bradycardia with HR in 30's.  Maintaining systolic bp well above 100.  EKG showed sinus brady.  Propofol stopped.  Atropine given with little change.  Systolic bp 155.  eICU Interventions  Stat head CT Propofol dc'd Follow neuro exam Atropine at bedside     Intervention Category Major Interventions: Arrhythmia - evaluation and management  CLAUDENE AGENT, P 10/30/2023, 10:20 PM

## 2023-10-30 NOTE — Progress Notes (Signed)
 RT transported pt on ventilator from resus to IR without any complications. Pt was then placed on the CRNAs ventilator.

## 2023-10-30 NOTE — ED Notes (Signed)
 Sheila Myers (dau.) of [facility] called asking for a update. Her number is [phone].

## 2023-10-30 NOTE — Progress Notes (Signed)
 PHARMACIST CODE STROKE RESPONSE  Notified to mix TNK at 1740 by Dr. Merrianne TNK preparation completed at 1742  TNK dose = 14 mg IV over 5 seconds  Issues/delays encountered (if applicable): n/a  Sheila Myers 10/30/23 5:48 PM

## 2023-10-30 NOTE — Consult Note (Signed)
 Reason for Consult:fal with decreased MS Referring Physician: Ozell Marine  Sheila Myers is an 89 y.o. female.  HPI: 89yo F was reportedly acting normal at home then fell in the bathroom striking her head. She was reportedly initially talking then had decreased LOC. She was brought in as a level one trauma. No blood thinners. On arrival GCS was E2V2M5=9 but then she had sonorous respirations so she was intubated by the EDP. No other Hx initially available.  No past medical history on file.  No family history on file.  Social History:  has no history on file for tobacco use, alcohol use, and drug use.  Allergies: No Known Allergies  Medications: I have reviewed the patient's current medications.  Results for orders placed or performed during the hospital encounter of 10/30/23 (from the past 48 hours)  CBG monitoring, ED     Status: Abnormal   Collection Time: 10/30/23  4:07 PM  Result Value Ref Range   Glucose-Capillary 110 (H) 70 - 99 mg/dL    Comment: Glucose reference range applies only to samples taken after fasting for at least 8 hours.    DG Chest Port 1 View Result Date: 10/30/2023 CLINICAL DATA:  Fall. EXAM: PORTABLE CHEST 1 VIEW COMPARISON:  Jun 13, 2020. FINDINGS: Stable cardiomediastinal silhouette. Nasogastric tube tip is seen in proximal stomach. Endotracheal tube tip is seen just above the carina; withdrawal by 3-4 cm is recommended. Minimal bibasilar subsegmental atelectasis is noted. Bony thorax is unremarkable. IMPRESSION: Endotracheal tube tip is seen just above the carina; withdrawal by 3-4 cm is recommended. Electronically Signed   By: Lynwood Landy Raddle M.D.   On: 10/30/2023 16:31   DG Pelvis Portable Result Date: 10/30/2023 CLINICAL DATA:  Fall. EXAM: PORTABLE PELVIS 1-2 VIEWS COMPARISON:  None Available. FINDINGS: There is no evidence of acute pelvic fracture or diastasis. Possible old left superior pubic ramus fracture. No pelvic bone lesions are seen.  IMPRESSION: No acute abnormality seen. Aortic Atherosclerosis (ICD10-I70.0). Electronically Signed   By: Lynwood Landy Raddle M.D.   On: 10/30/2023 16:30    Review of Systems  Unable to perform ROS: Intubated   Blood pressure (!) 203/83, pulse 71, resp. rate (!) 21, height 5' 3 (1.6 m), weight 59 kg, SpO2 100%. Physical Exam Constitutional:      General: She is in acute distress.  HENT:     Head: Normocephalic.     Nose: Nose normal.     Mouth/Throat:     Mouth: Mucous membranes are dry.  Eyes:     Comments: Pupils small but equal  Neck:     Comments: We applied a collar Cardiovascular:     Rate and Rhythm: Normal rate and regular rhythm.     Pulses: Normal pulses.  Pulmonary:     Effort: Pulmonary effort is normal.     Breath sounds: Normal breath sounds. No wheezing.  Abdominal:     General: Abdomen is flat. There is no distension.     Palpations: Abdomen is soft.     Tenderness: There is no abdominal tenderness.  Musculoskeletal:        General: No tenderness or deformity.  Skin:    General: Skin is warm.     Capillary Refill: Capillary refill takes 2 to 3 seconds.  Neurological:     Comments: GCS as above, initially purposeful RUE, no movement LUE, min WD to pain LLE     Assessment/Plan: GLF  R ICA and MCA occlusion - Neurology  consult No acute injuries otherwise on CTs Rec CCM admit, Neurology consult already called.  Please re-call Trauma PRN.  Critical care  Dann FORBES Hummer 10/30/2023, 4:59 PM

## 2023-10-30 NOTE — H&P (Signed)
 NEUROLOGY H&P NOTE   Date of service: October 30, 2023 Patient Name: Sheila Myers MRN:  [id number] DOB:  1929/01/06 Chief Complaint: Code Stroke Requesting Provider: Stroke, Md, MD  History of Present Illness  Sheila Myers is a 89 y.o. female with hx of prior stroke, HTN, HLD, GERD, IBS, pre-diabetes who presented 10/30/23 after a fall at home where she hit her head on the sink, no LOC.  She initially presented as a level 1 trauma. No LOC, but immediately altered per EMS. Not anticoagulated. Initial BP 196/90, HR 110, CBG 137 with EMS.  She made lunch for her son around 1300 and then at 1500 she was walking to the bathroom and had an unwitnessed fall.  She was intubated in the ED for decreased LOC, decreased RR. EDP noted that she was not withdrawing to pain on left side.  CTA done showed an occluded right ICA and a code stroke was activated.   LKW: 1500 Modified rankin score: 1-No significant post stroke disability and can perform usual duties with stroke symptoms IV Thrombolysis: Yes, discussed with son Harman and other family members over the phone.  Delay as he wanted confirmation from his siblings that they were okay with her receiving TNK EVT: Yes  NIHSS components Score: Comment  1a Level of Conscious 0[]  1[]  2[x]  3[]      1b LOC Questions 0[]  1[]  2[x]       1c LOC Commands 0[]  1[]  2[x]       2 Best Gaze 0[]  1[]  2[x]       3 Visual 0[]  1[]  2[]  3[x]      4 Facial Palsy 0[]  1[]  2[]  3[x]      5a Motor Arm - left 0[]  1[]  2[]  3[]  4[x]  UN[]    5b Motor Arm - Right 0[]  1[]  2[]  3[x]  4[]  UN[]    6a Motor Leg - Left 0[]  1[]  2[]  3[]  4[x]  UN[]    6b Motor Leg - Right 0[]  1[]  2[]  3[x]  4[]  UN[]    7 Limb Ataxia 0[x]  1[]  2[]  UN[]      8 Sensory 0[]  1[x]  2[]  UN[]      9 Best Language 0[]  1[]  2[]  3[x]      10 Dysarthria 0[]  1[]  2[]  UN[x]      11 Extinct. and Inattention 0[x]  1[]  2[]       TOTAL:  32       ROS   Unable to ascertain due to unresponsive  Past History  HTN Remote prior history  of stroke Prior MI with stents about 20 years ago No prior history of ICH  PSHx No recent surgeries Past surgery on her ankle  Family History: No family history on file.  Social History  has no history on file for tobacco use, alcohol use, and drug use.  No Known Allergies  Medications   Current Facility-Administered Medications:    acetaminophen (TYLENOL) tablet 650 mg, 650 mg, Oral, Q4H PRN, Bowser, Grace E, NP   Chlorhexidine Gluconate Cloth 2 % PADS 6 each, 6 each, Topical, Daily, Bowser, Grace E, NP   clevidipine (CLEVIPREX) infusion 0.5 mg/mL, 0-21 mg/hr, Intravenous, Continuous, [name], DO, Last Rate: 2 mL/hr at 10/30/23 1805, 1 mg/hr at 10/30/23 1805   docusate sodium (COLACE) capsule 100 mg, 100 mg, Oral, BID PRN, Bowser, Grace E, NP   etomidate (AMIDATE) injection, , Intravenous, Code/Trauma/Sedation Med, Pamella Ozell A, DO, 10 mg at 10/30/23 1612   famotidine (PEPCID) tablet 20 mg, 20 mg, Per Tube, BID, Bowser, Grace E, NP   fentaNYL (SUBLIMAZE) bolus via  infusion 25-100 mcg, 25-100 mcg, Intravenous, Q15 min PRN, Penna, Michael A, DO, 100 mcg at 10/30/23 1652   fentaNYL in NS (45mcg/ml) infusion-PREMIX, 0-400 mcg/hr, Intravenous, Continuous, [name], DO, Stopped at 10/30/23 1753   iohexol (OMNIPAQUE) 300 MG/ML solution 150 mL, 150 mL, Intra-arterial, Once PRN, Ray Coy, MD   ipratropium-albuterol (DUONEB) 0.5-2.5 (3) MG/3ML nebulizer solution 3 mL, 3 mL, Nebulization, Q6H PRN, Bowser, Grace E, NP   lactated ringers infusion, , Intravenous, Continuous, Bowser, Grace E, NP   ondansetron (ZOFRAN) injection 4 mg, 4 mg, Intravenous, Q6H PRN, Bowser, Grace E, NP   pantoprazole (PROTONIX) injection 40 mg, 40 mg, Intravenous, QHS, Bowser, Grace E, NP   polyethylene glycol (MIRALAX / GLYCOLAX) packet 17 g, 17 g, Oral, Daily PRN, Bowser, Grace E, NP   potassium chloride 10 mEq in 100 mL IVPB, 10 mEq, Intravenous, Q1 Hr x 6, Bowser, Grace E,  NP   propofol (DIPRIVAN) 1000 MG/100ML infusion, 0-80 mcg/kg/min, Intravenous, Titrated, [name], DO, Stopped at 10/30/23 1753   succinylcholine (ANECTINE) injection, , Intravenous, Code/Trauma/Sedation Med, Pamella Ozell A, DO, 100 mg at 10/30/23 1613 No current outpatient medications on file.  Facility-Administered Medications Ordered in Other Encounters:    0.9 %  sodium chloride infusion, , Intravenous, Continuous PRN, Mannie Krystal LABOR, CRNA, Last Rate: 75 mL/hr at 10/30/23 1752, New Bag at 10/30/23 1752   lactated ringers infusion, , Intravenous, Continuous PRN, Mannie Krystal LABOR, CRNA, New Bag at 10/30/23 1752   rocuronium [facility]) injection, , Intravenous, Anesthesia Intra-op, Mannie Krystal LABOR, CRNA, 50 mg at 10/30/23 1808   Vitals   Vitals:   10/30/23 1743 10/30/23 1745 10/30/23 1750 10/30/23 1755  BP:  (!) 180/73 (!) 164/62 (!) 158/70  Pulse:  (!) 55 61 (!) 58  Resp:  16 15 (!) 27  Temp:      TempSrc:      SpO2:  100% 100% 100%  Weight: 56.7 kg     Height:        Body mass index is 22.14 kg/m.   Physical Exam   Physical Exam  Constitutional: NAD  Psych: Sedated  Eyes: No scleral injection.  HENT: ETT in place Head: Normocephalic. No evidence for head trauma.  Cardiovascular: HR 55-60 and regular rhythm.  Respiratory: Mechanically ventilated  GI: Soft.  No distension. There is no tenderness.  Skin: WDI.   Neuro: Mental Status: RSI for intubation, on propofol at 60mcg/kg/min Patient is unresponsive, no verbal output in the context of sedation and recent administration of paralytic  does not follow commands Cranial Nerves: II: PERRL III,IV, VI: Eyes are near the midline without spontaneous movement.  VII: Facial movement is obscured by ETT. Corneal intact brisk on the right and sluggish on the left VIII: Unable to assess X: No cough or gag  XI: Head is midline XII: Tongue protrudes midline without atrophy or fasciculations.  Motor: Tone is decreased  x 4. Moves RLE to noxious plantar stimulation. No movement of other extremities spontaneously or to noxious. Bulk is normal.   Sensory: Does not localize to painful stimuli (sedated) Cerebellar/Gait: Unable to assess   Labs/Imaging/Neurodiagnostic studies   Non-contrast head CT:  1. Subtle loss of gray-white differentiation at the right insula, likely reflecting an acute right middle cerebral artery territory infarct. ASPECTS 9.  2. No acute intracranial hemorrhage. 3. Known small chronic infarcts within the right thalamus and bilateral cerebellar hemispheres. 4. Background parenchymal atrophy and chronic small vessel ischemic disease. 5.  Left periorbital hematoma.   CTA neck:  1. The right internal carotid artery is occluded at its origin and remains occluded throughout the remainder of the neck. 2. The left common and internal carotid arteries are patent within the neck. Apparent 70% stenosis of the proximal left ICA. However, this stenosis could potentially be exaggerated by blooming artifact from calcified plaque at this site. Consider a carotid artery duplex when feasible. 3. Vertebral arteries patent within the neck. Severe atherosclerotic stenosis at the right vertebral artery origin. Atherosclerotic plaque within the vertebral artery V3 and V4 segments with sites of severe stenosis, bilaterally. Calcified atherosclerotic plaque at the left vertebral artery origin resulting in at least moderate stenosis. 4. Aortic atherosclerosis  5. Multiple thyroid nodules, the largest within the left lobe measuring 17 mm. Given the patient's age, a non-emergent thyroid ultrasound may be obtained for further evaluation if clinically appropriate. Reference: J Am Coll Radiol. 2015 Feb;12(2): 143-50.   CTA head:  1. The right internal carotid artery remains occluded intracranially. The right middle cerebral artery is occluded at its origin. Additionally, the right anterior cerebral artery A1 segment is  occluded at its very proximal aspect. There is reconstitution of enhancement within the right A1 segment more distally (due to cross flow via the anterior communicating artery).  2. As before, only a single vessel is visualized arising from the distal basilar artery on the right. It is uncertain if this is the right posterior cerebral artery, right superior cerebellar artery or a vessel contributing supply to both of these vascular territories. 3. Background intracranial atherosclerotic disease as described within the body of the report. 4. Sites of up to moderate stenosis within the intracranial left internal carotid artery. 5. Atherosclerotic plaque within the vertebral artery V3 and V4 segments with sites of severe stenosis, bilaterally.    ASSESSMENT  Sheila Myers is a 89 y.o. female hx of prior stroke, HTN, HLD, GERD, IBS, pre-diabetes who presented 10/30/23 after a fall at home. Pt had witnessed fall and hit her head on a sink. No LOC, but immediately altered per EMS. Not anticoagulated. Initial BP 196/90, HR 110, CBG 137 with EMS. She was subsequently intubated in the ED for decreased LOC, decreased RR. EDP noted that she was not withdrawing to pain on left side.  She received TNK and was taken to IR after a long discussion with the family.  Admit to ICU under stroke team. - Exam prior to intubation revealed right sided weakness. First NIHSS was obtained after intubation and is confounded by sedation, with NIHSS of 32.  - Imaging studies as documented above.  - EKG: Sinus rhythm; Borderline intraventricular conduction delay - After comprehensive review of possible contraindications, she has no absolute contraindications to TNK administration. - Patient is a TNK candidate. Discussed extensively the risks/benefits of TNK treatment vs. no treatment with the patient's son, including risks of hemorrhage and death with TNK administration versus worse overall outcomes on average in patients within  the IV thrombolytic time window who are not administered TNK. The patient is currently intubated and sedated, which precludes meaningful medical decision making on her part at this time. Overall benefits of TNK regarding long-term prognosis are felt to outweigh risks. The patient's son expressed understanding and wish to proceed with TNK.  - The patient is a VIR candidate. Risks/benefits of the procedure were discussed extensively with patient, including approximately 50% chance of significant improvement relative to an approximate 10% chance of subarachnoid hemorrhage with possibility of significant  worsening including death. The patient's son expressed understanding and provided informed consent to proceed with VIR. All questions answered.  Consent form signed. - Impression: Acute right MCA infarction secondary to right ICA and MCA occlusions.   PLAN  1. Admitting to Neuro ICU after VIR.  2. Post-TNK and VIR order set to include frequent neuro checks and BP management.  3. No antiplatelet medications or anticoagulants for at least 24 hours following TNK. Will need to clear with CT at 24 hours.  4. DVT prophylaxis with SCDs.  5. Hold off on starting a statin due to advanced old age (nonagenerian patient). There is evidence for decreased benefit to risk ratio in advanced old age for statins.  6. Will need to be started on antiplatelet therapy if follow up CT at 24 hours is negative for hemorrhagic conversion. Family states that they do not know whether or not she is taking ASA or Plavix at home, despite her history of cardiac stenting.  7. Carotid ultrasound  8. TTE.  9. MRI brain.  10. PT/OT/Speech.  11. NPO until passes swallow evaluation.  12. Telemetry monitoring 13. Fasting lipid panel, HgbA1c 14. CCM consulted   CRITICAL CARE Performed by: MERRIANNE, Khole Branch   Total critical care time: 60 minutes  Critical care time was exclusive of separately billable procedures and treating other  patients.  Critical care was necessary to treat or prevent imminent or life-threatening deterioration.  Critical care was time spent personally by me on the following activities: development of treatment plan with patient and/or surrogate as well as nursing, discussions with consultants, evaluation of patient's response to treatment, examination of patient, obtaining history from patient or surrogate, ordering and performing treatments and interventions, ordering and review of laboratory studies, ordering and review of radiographic studies, pulse oximetry and re-evaluation of patient's condition.  ______________________________________________________________________    Bonney MERRIANNE, Tahlor Berenguer, MD [facility]

## 2023-10-30 NOTE — Progress Notes (Signed)
 Transition of Care [facility]) - CAGE-AID Screening   Patient Details  Name: Sheila Myers MRN: [id number] Date of Birth: Apr 22, 1928  Trauma Response Nurse [name], RN Phone Number: [phone] 10/30/2023, 6:37 PM   CAGE-AID Screening: Substance Abuse Screening unable to be completed due to: : (S) Patient unable to participate (Intubated, stroke work up-- to IR from ED)             Substance Abuse Education Offered: No

## 2023-10-30 NOTE — ED Notes (Signed)
 Sheila Myers (dau.) called asking for a update. Her number is [phone]

## 2023-10-30 NOTE — H&P (Signed)
 NAME:  Sheila Myers, MRN:  [id number], DOB:  July 28, 1928, LOS: 0 ADMISSION DATE:  10/30/2023, CONSULTATION DATE:  10/30/23 REFERRING MD:  Pamella, CHIEF COMPLAINT:  fall    History of Present Illness:  89 yo F  PMH prior stroke, HTN, HLD, GERD, IBS, pre-diabetes who presented 10/30/23 after a fall at home where she hit her head on the sink, no LOC. Initial BP 196 SBP.   She was intubated in the ED. Initially worked up as a trauma but it was discovered that she has s R MCA stroke. CTA neck w occluded R ICA, 70% stenosed prox L ICA and vertebral artery stenosis  Started on clevi gtt To rcv tnk Going [facility]   PCCM consulted in this setting    Pertinent  Medical History  No known contributory PMH   Significant Hospital Events: Including procedures, antibiotic start and stop dates in addition to other pertinent events   10/30/23 fall at home, hit head on sink. Intubated in ED. Found to have   Interim History / Subjective:  Stopping clevi gtt with SBP 130s   Objective    Blood pressure (!) 158/70, pulse (!) 58, temperature (!) 97.1 F (36.2 C), temperature source Temporal, resp. rate (!) 27, height 5' 3 (1.6 m), weight 56.7 kg, SpO2 100%.    Vent Mode: PRVC FiO2 (%):  [100 %] 100 % Set Rate:  [16 bmp] 16 bmp Vt Set:  [420 mL] 420 mL PEEP:  [5 cmH20] 5 cmH20 Plateau Pressure:  [17 cmH20] 17 cmH20  No intake or output data in the 24 hours ending 10/30/23 1808 Filed Weights   10/30/23 1607 10/30/23 1743  Weight: 59 kg 56.7 kg    Examination: General: critically ill elderly F intubated sedated  HENT: ETT secure  Lungs: mechanically ventilated  Cardiovascular: rr Abdomen: thin Extremities: no obvious acute joint deformity  Neuro: sedated GU: defer   Resolved problem list   Assessment and Plan    R MCA CVA R ICA occlusion L ICA stenosis Vertebral artery stenosis  Hx prior stroke P -to rcv tnk and then go to NIR -clevi for BP goal per NIR ( goal will depend on  procedural outcome)  -secondary workup per neuro  -fq neuro checks   Endotracheally intubated  Hx asthma  P -Vap, pulm hygiene -RASS 0/-1  -PRN neb  HTN HLD P -BP goal to be defined pending NIR, clevi for goal -statin when appropriate   Hypokalemia -low on iSTAT in ED P -serum CMP pending, replace PRN  GERD -PPI ordered  GOC -neuro has spoken w family. Apparently very highly functioning and independent. Family endorsed wanting full code, full scope of tx    Labs   CBC: Recent Labs  Lab 10/30/23 1653 10/30/23 1704 10/30/23 1708 10/30/23 1744  WBC 6.5  --   --   --   NEUTROABS  --   --  7.0  --   HGB 10.0* 10.5*  --  10.5*  HCT 32.7* 31.0*  --  31.0*  MCV 72.7*  --   --   --   PLT 175  --   --   --     Basic Metabolic Panel: Recent Labs  Lab 10/30/23 1653 10/30/23 1704 10/30/23 1744  NA 138 142 143  K 2.8* 2.9* 3.1*  CL 106 104  --   CO2 21*  --   --   GLUCOSE 124* 125*  --   BUN 18 20  --  CREATININE 0.78 0.80  --   CALCIUM 8.2*  --   --    GFR: Estimated Creatinine Clearance: 36.3 mL/min (by C-G formula based on SCr of 0.8 mg/dL). Recent Labs  Lab 10/30/23 1653 10/30/23 1704  WBC 6.5  --   LATICACIDVEN  --  1.4    Liver Function Tests: Recent Labs  Lab 10/30/23 1653  AST 20  ALT 20  ALKPHOS 44  BILITOT 0.7  PROT 5.5*  ALBUMIN 2.8*   No results for input(s): LIPASE, AMYLASE in the last 168 hours. No results for input(s): AMMONIA in the last 168 hours.  ABG    Component Value Date/Time   PHART 7.358 10/30/2023 1744   PCO2ART 45.4 10/30/2023 1744   PO2ART 337 (H) 10/30/2023 1744   HCO3 25.8 10/30/2023 1744   TCO2 27 10/30/2023 1744   O2SAT 100 10/30/2023 1744     Coagulation Profile: Recent Labs  Lab 10/30/23 1653  INR 1.4*    Cardiac Enzymes: No results for input(s): CKTOTAL, CKMB, CKMBINDEX, TROPONINI in the last 168 hours.  HbA1C: No results found for: HGBA1C  CBG: Recent Labs  Lab  10/30/23 1607  GLUCAP 110*    Review of Systems:   Unable to obtain   Past Medical History:  She,  has no past medical history on file. HTN HLD GERD IBS preDM  Surgical History:    Social History:      Family History:  Her family history is not on file.   Allergies No Known Allergies   Home Medications  Prior to Admission medications   Not on File     Critical care time: 42 min         CRITICAL CARE Performed by: Ronnald FORBES Gave   Total critical care time: 42 minutes  Critical care time was exclusive of separately billable procedures and treating other patients. Critical care was necessary to treat or prevent imminent or life-threatening deterioration.  Critical care was time spent personally by me on the following activities: development of treatment plan with patient and/or surrogate as well as nursing, discussions with consultants, evaluation of patient's response to treatment, examination of patient, obtaining history from patient or surrogate, ordering and performing treatments and interventions, ordering and review of laboratory studies, ordering and review of radiographic studies, pulse oximetry and re-evaluation of patient's condition.   Ronnald Gave MSN, AGACNP-BC [place] [facility] Amion for pager  10/30/2023, 6:08 PM

## 2023-10-30 NOTE — ED Notes (Addendum)
 Dr. Sebastian, trauma doctor arrived prior to pt arrival, @ [phone]

## 2023-10-30 NOTE — Anesthesia Postprocedure Evaluation (Signed)
 Anesthesia Post Note  Patient: Sheila Myers  Procedure(s) Performed: RADIOLOGY WITH ANESTHESIA     Patient location during evaluation: SICU Anesthesia Type: General Level of consciousness: sedated Pain management: pain level controlled Vital Signs Assessment: post-procedure vital signs reviewed and stable Respiratory status: patient remains intubated per anesthesia plan Cardiovascular status: stable Postop Assessment: no apparent nausea or vomiting Anesthetic complications: no   No notable events documented.  Last Vitals:  Vitals:   10/30/23 1932 10/30/23 1945  BP: (!) 144/59   Pulse: (!) 59 61  Resp: 18 16  Temp:    SpO2: 100% 100%    Last Pain:  Vitals:   10/30/23 2000  TempSrc: Bladder                 Sheila Myers

## 2023-10-30 NOTE — ED Provider Notes (Signed)
 [place] EMERGENCY DEPARTMENT AT [facility] Provider Note   CSN: [id number] Arrival date & time: 10/30/23  1600     Patient presents with: Sheila Myers   ROCIO Myers is a 89 y.o. female.  {Add pertinent medical, surgical, social history, OB history to YEP:67052}  Fall       Prior to Admission medications   Not on File    Allergies: Patient has no known allergies.    Review of Systems  Updated Vital Signs BP (!) 203/83   Pulse 71   Temp (!) 97.1 F (36.2 C) (Temporal)   Resp (!) 21   Ht 5' 3 (1.6 m)   Wt 59 kg   SpO2 100%   BMI 23.03 kg/m   Physical Exam  (all labs ordered are listed, but only abnormal results are displayed) Labs Reviewed  CBG MONITORING, ED - Abnormal; Notable for the following components:      Result Value   Glucose-Capillary 110 (*)    All other components within normal limits  I-STAT CHEM 8, ED - Abnormal; Notable for the following components:   Potassium 2.9 (*)    Glucose, Bld 125 (*)    Hemoglobin 10.5 (*)    HCT 31.0 (*)    All other components within normal limits  COMPREHENSIVE METABOLIC PANEL WITH GFR  CBC  ETHANOL  URINALYSIS, ROUTINE W REFLEX MICROSCOPIC  PROTIME-INR  TRIGLYCERIDES  APTT  DIFFERENTIAL  RAPID URINE DRUG SCREEN, HOSP PERFORMED  I-STAT CG4 LACTIC ACID, ED  SAMPLE TO BLOOD BANK    EKG: None  Radiology: [facility] Chest Port 1 View Result Date: 10/30/2023 CLINICAL DATA:  Fall. EXAM: PORTABLE CHEST 1 VIEW COMPARISON:  Jun 13, 2020. FINDINGS: Stable cardiomediastinal silhouette. Nasogastric tube tip is seen in proximal stomach. Endotracheal tube tip is seen just above the carina; withdrawal by 3-4 cm is recommended. Minimal bibasilar subsegmental atelectasis is noted. Bony thorax is unremarkable. IMPRESSION: Endotracheal tube tip is seen just above the carina; withdrawal by 3-4 cm is recommended. Electronically Signed   By: Lynwood Landy Raddle M.D.   On: 10/30/2023 16:31   DG Pelvis Portable Result  Date: 10/30/2023 CLINICAL DATA:  Fall. EXAM: PORTABLE PELVIS 1-2 VIEWS COMPARISON:  None Available. FINDINGS: There is no evidence of acute pelvic fracture or diastasis. Possible old left superior pubic ramus fracture. No pelvic bone lesions are seen. IMPRESSION: No acute abnormality seen. Aortic Atherosclerosis (ICD10-I70.0). Electronically Signed   By: Lynwood Landy Raddle M.D.   On: 10/30/2023 16:30    {Document cardiac monitor, telemetry assessment procedure when appropriate:32947} Procedures   Medications Ordered in the ED  etomidate (AMIDATE) injection (10 mg Intravenous Given 10/30/23 1612)  succinylcholine (ANECTINE) injection (100 mg Intravenous Given 10/30/23 1613)  propofol (DIPRIVAN) 1000 MG/100ML infusion (60 mcg/kg/min  59 kg Intravenous Rate/Dose Change 10/30/23 1652)  fentaNYL 2500mcg in NS (10mcg/ml) infusion-PREMIX (100 mcg/hr Intravenous New Bag/Given 10/30/23 1625)  fentaNYL (SUBLIMAZE) bolus via infusion 25-100 mcg (100 mcg Intravenous Bolus from Bag 10/30/23 1652)  clevidipine (CLEVIPREX) infusion 0.5 mg/mL (2 mg/hr Intravenous New Bag/Given 10/30/23 1652)  iohexol (OMNIPAQUE) 350 MG/ML injection 80 mL (80 mLs Intravenous Contrast Given 10/30/23 1706)    Clinical Course as of 10/30/23 1709  Thu Oct 30, 2023  1650 Son  [MP]  1703 Neuro code stroke [MP]    Clinical Course User Index [MP] [name], DO   {Click here for ABCD2, HEART and other calculators REFRESH Note before signing:1}  Medical Decision Making Amount and/or Complexity of Data Reviewed Labs: ordered. Radiology: ordered.  Risk Prescription drug management. Decision regarding hospitalization.   ***  {Document critical care time when appropriate  Document review of labs and clinical decision tools ie CHADS2VASC2, etc  Document your independent review of radiology images and any outside records  Document your discussion with family members, caretakers and with  consultants  Document social determinants of health affecting pt's care  Document your decision making why or why not admission, treatments were needed:32947:::1}   Final diagnoses:  None    ED Discharge Orders     None

## 2023-10-30 NOTE — Anesthesia Preprocedure Evaluation (Addendum)
 Anesthesia Evaluation  Patient identified by MRN, date of birth, ID band Patient unresponsive    Reviewed: Allergy & Precautions, NPO status , Patient's Chart, lab work & pertinent test results, Unable to perform ROS - Chart review onlyPreop documentation limited or incomplete due to emergent nature of procedure.  Airway Mallampati: Intubated  TM Distance: >3 FB Neck ROM: Full    Dental   Pulmonary neg pulmonary ROS   breath sounds clear to auscultation   + intubated    Cardiovascular hypertension, Normal cardiovascular exam Rhythm:Regular Rate:Normal     Neuro/Psych R MCA stroke CVA    GI/Hepatic Neg liver ROS,GERD  ,,  Endo/Other  negative endocrine ROS    Renal/GU negative Renal ROS     Musculoskeletal negative musculoskeletal ROS (+)    Abdominal   Peds  Hematology  (+) Blood dyscrasia, anemia   Anesthesia Other Findings Day of surgery medications reviewed with the patient.  Reproductive/Obstetrics                              Anesthesia Physical Anesthesia Plan  ASA: 5 and emergent  Anesthesia Plan: General   Post-op Pain Management:    Induction: Intravenous  PONV Risk Score and Plan: 3 and Treatment may vary due to age or medical condition  Airway Management Planned: Oral ETT  Additional Equipment:   Intra-op Plan:   Post-operative Plan: Post-operative intubation/ventilation  Informed Consent: I have reviewed the patients History and Physical, chart, labs and discussed the procedure including the risks, benefits and alternatives for the proposed anesthesia with the patient or authorized representative who has indicated his/her understanding and acceptance.     Dental advisory given, Only emergency history available and History available from chart only  Plan Discussed with: CRNA  Anesthesia Plan Comments:          Anesthesia Quick Evaluation

## 2023-10-30 NOTE — ED Notes (Signed)
 Preparing to intubate. Decreased LOC. Decreased RR, pt not withdrawing to pain on L side.

## 2023-10-30 NOTE — Procedures (Signed)
  NEUROSURGERY BRIEF THROMBECTOMY NOTE   PREOP DX: Acute stroke  POSTOP DX: Same  PROCEDURE: Right ICA and MCA thrombectomy  SURGEON: Ethlyn Alto   ANESTHESIA: GETA  EBL: Minimal  Number of Passes: 3  Technique: ASPIRATION  Final TICI score: 2B  Post OP blood pressure goal: SBP<160  Arterial Angioplasty or Stent: No   Anti-Platelet Therapy: No   COMPLICATIONS: No   CONDITION: Stable to recovery  FINDINGS (Full report in [facility]): 1. Acute embolic occlusion of the ICA, M1 and ACA. 2. Successful thrombectomy of the right ICA and M1 segments.  No defects in this territory. 3. Right ACA not amenable to thrombectomy.   Sharnise Blough  @today @ 7:09 PM

## 2023-10-30 NOTE — ED Notes (Signed)
 Trauma Response Nurse Documentation   Sheila Myers is a 89 y.o. female arriving to [name] ED via [facility]  On No antithrombotic. Trauma was activated as a Level 1 by charge based on the following trauma criteria GCS < 9.  Patient cleared for CT by Dr. Sebastian. Pt transported to CT with trauma response nurse present to monitor. RN remained with the patient throughout their absence from the department for clinical observation.   GCS 3.  Trauma MD Arrival Time: 1554.  History   Chart will be merged with old charts        Initial Focused Assessment (If applicable, or please see trauma documentation): Airway - clear Breathing - Unlabored- pt is unable to protect airway due to GCS- decision to intubate made on arrival to ED per Dr. Pamella and Dr. Sebastian Circulation - strong peripheral pulses GCS 3  CT's Completed:   CT Head, CT C-Spine, CT Chest w/ contrast, and CT abdomen/pelvis w/ contrast, CTA head and neck   Interventions:  Labs Xrays CT scans Stroke eval,  TNK IR  Plan for disposition:  Admission to ICU after IR  Event Summary:  See EDP and TMD notes    Sheila Myers  Trauma Response RN  Please call TRN at [phone] for further assistance.

## 2023-10-30 NOTE — Progress Notes (Signed)
 NAME:  Sheila Myers, MRN:  [id number], DOB:  [birth year], LOS: 0 ADMISSION DATE:  10/30/2023, CONSULTATION DATE:  10/30/23 REFERRING MD:  Pamella, CHIEF COMPLAINT:  fall    History of Present Illness:  89 yo F  PMH prior stroke, HTN, HLD, GERD, IBS, pre-diabetes who presented 10/30/23 after a fall at home where she hit her head on the sink, no LOC. Initial BP 196 SBP.   She was intubated in the ED. Initially worked up as a trauma but it was discovered that she has s R MCA stroke. CTA neck w occluded R ICA, 70% stenosed prox L ICA and vertebral artery stenosis  Started on clevi gtt To rcv tnk Going to [facility]   PCCM consulted in this setting    Pertinent  Medical History  No known contributory PMH   Significant Hospital Events: Including procedures, antibiotic start and stop dates in addition to other pertinent events   10/30/23 fall at home, hit head on sink. Intubated in ED. Found to have  10/2: mechanical thrombectomy with NIR, remains intubated  Interim History / Subjective:  10/2: now on neo gtt at this time to maintain BP at NIR goal 120-140.. update 2400 called to assess after Hr drop precipitously with BP spike. Started back on cleviprex, propofol off. Elink ordered stat cth. I personally spoke with neuro and requested eval and f/u on cth. Ordered isoproterenol to increase hr. Favor propofol and isoproterenol vs clevi. D/w neuro who will f/u on imaging.   Objective    Blood pressure (!) 132/54, pulse (!) 47, temperature (!) 96.3 F (35.7 C), resp. rate 16, height 5' 3 (1.6 m), weight 40.1 kg, SpO2 100%.    Vent Mode: PRVC FiO2 (%):  [50 %-100 %] 50 % Set Rate:  [16 bmp] 16 bmp Vt Set:  [420 mL] 420 mL PEEP:  [5 cmH20] 5 cmH20 Plateau Pressure:  [12 cmH20-17 cmH20] 12 cmH20   Intake/Output Summary (Last 24 hours) at 10/30/2023 2215 Last data filed at 10/30/2023 2100 Gross per 24 hour  Intake 493.13 ml  Output 250 ml  Net 243.13 ml   Filed Weights   10/30/23 1607  10/30/23 1743 10/30/23 1932  Weight: 59 kg 56.7 kg 40.1 kg    Examination: General: critically ill elderly F intubated sedated  HENT: ETT secure  Lungs: mechanically ventilated  Cardiovascular: rr Abdomen: thin Extremities: no obvious acute joint deformity  Neuro: sedated GU: defer   Resolved problem list   Assessment and Plan    R MCA CVA R ICA occlusion L ICA stenosis Vertebral artery stenosis  Hx prior stroke P -s/p TNK -s/p NIR intervention with thrombectomy -off clevi and now on neo to maintain SBP 120-140 -secondary workup per neuro  -fq neuro checks   Endotracheally intubated  Hx asthma  P -Vap, pulm hygiene -RASS 0/-1  -PRN neb  HTN HLD P -BP goal per NIR and neuro -statin when appropriate   Hypokalemia P -serum CMP pending, replace PRN -improving slowly K  GERD -PPI ordered  GOC -neuro has spoken w family. Apparently very highly functioning and independent. Family endorsed wanting full code, full scope of tx    Labs   CBC: Recent Labs  Lab 10/30/23 1653 10/30/23 1704 10/30/23 1708 10/30/23 1744  WBC 6.5  6.5  --   --   --   NEUTROABS 5.0  --  7.0  --   HGB 10.1*  10.0* 10.5*  --  10.5*  HCT 32.1*  32.7*  31.0*  --  31.0*  MCV 72.0*  72.7*  --   --   --   PLT 172  175  --   --   --     Basic Metabolic Panel: Recent Labs  Lab 10/30/23 1653 10/30/23 1704 10/30/23 1744 10/30/23 2040  NA 138 142 143 141  K 2.8* 2.9* 3.1* 3.3*  CL 106 104  --  109  CO2 21*  --   --  23  GLUCOSE 124* 125*  --  116*  BUN 18 20  --  17  CREATININE 0.78 0.80  --  0.80  CALCIUM 8.2*  --   --  8.7*   GFR: Estimated Creatinine Clearance: 27.8 mL/min (by C-G formula based on SCr of 0.8 mg/dL). Recent Labs  Lab 10/30/23 1653 10/30/23 1704  WBC 6.5  6.5  --   LATICACIDVEN  --  1.4    Liver Function Tests: Recent Labs  Lab 10/30/23 1653  AST 20  ALT 20  ALKPHOS 44  BILITOT 0.7  PROT 5.5*  ALBUMIN 2.8*   No results for input(s):  LIPASE, AMYLASE in the last 168 hours. No results for input(s): AMMONIA in the last 168 hours.  ABG    Component Value Date/Time   PHART 7.358 10/30/2023 1744   PCO2ART 45.4 10/30/2023 1744   PO2ART 337 (H) 10/30/2023 1744   HCO3 25.8 10/30/2023 1744   TCO2 27 10/30/2023 1744   O2SAT 100 10/30/2023 1744     Coagulation Profile: Recent Labs  Lab 10/30/23 1653  INR 1.4*    Cardiac Enzymes: No results for input(s): CKTOTAL, CKMB, CKMBINDEX, TROPONINI in the last 168 hours.  HbA1C: Hgb A1c MFr Bld  Date/Time Value Ref Range Status  10/30/2023 08:40 PM 5.7 (H) 4.8 - 5.6 % Final    Comment:    (NOTE) Diagnosis of Diabetes The following HbA1c ranges recommended by the American Diabetes Association (ADA) may be used as an aid in the diagnosis of diabetes mellitus.  Hemoglobin             Suggested A1C NGSP%              Diagnosis  <5.7                   Non Diabetic  5.7-6.4                Pre-Diabetic  >6.4                   Diabetic  <7.0                   Glycemic control for                       adults with diabetes.      CBG: Recent Labs  Lab 10/30/23 1607  GLUCAP 110*    Review of Systems:   Unable to obtain   Past Medical History:  She,  has no past medical history on file. HTN HLD GERD IBS preDM  Surgical History:    Social History:      Family History:  Her family history is not on file.   Allergies No Known Allergies   Home Medications  Prior to Admission medications   Not on File     Critical care time:        CRITICAL CARE Performed by: Harlene Na

## 2023-10-30 NOTE — Code Documentation (Signed)
 Stroke Response Nurse Documentation Code Documentation  DARIONA POSTMA is a 89 y.o. female arriving to [facility]  via [facility] on 10/30/2023 with past medical hx of unk. On No antithrombotic. Code stroke was activated by ED.   Patient from home where she was LKW at 1500 and now complaining of fall and left hemiparesis   Stroke team at the bedside on patient arrival. Labs drawn and patient cleared for CT by Dr. Pamella. Patient to CT with team. NIHSS 31, see documentation for details and code stroke times. Patient with decreased LOC, disoriented, not following commands, bilateral hemianopia, bilateral facial droop, bilateral arm weakness, bilateral leg weakness, bilateral decreased sensation, and Global aphasia  on exam. The following imaging was completed:  CT Head and CTA. Patient is a candidate for IV Thrombolytic due to fixed neuro deficit. Patient is a candidate for IR due to large vessel occlusion stroke.   Care Plan:   TNK:  Q10min x 2 hours, q30min x 6 hours, q1h x 16 hours until 24 hour mark  BP < 180/105  Call Neuro for:  New Headache, worsening symptoms, bleeding, nausea/vomiting, or any signs of angioedema.  IR: VS & NIHSS: Q93min x 1 hour, Q51min x 1 hour, then Q1h x 22 hrs Vascular checks and distal pulse (s/p sheath d/c) : q15min x4, q30min x2, q1h x4, then q2h .   Process Delays Noted: family decision making  Bedside handoff with IR RN Augustin.    Piera Downs Livengood  Stroke Response RN

## 2023-10-30 NOTE — ED Notes (Signed)
 Pt arrives via [facility] from home. Pt had witnessed fall and hit her head on a sink. No LOC, but immediately altered per EMS. Not anticoagulated. Initial BP 196/90, HR 110, CBG 137 with EMS.

## 2023-10-30 NOTE — Progress Notes (Signed)
 RT transported pt on ventilator from resus to CT and back to resus without any complications. RN/Trauma team at bedside.

## 2023-10-30 NOTE — ED Notes (Signed)
C-collar placed by Trauma RN

## 2023-10-30 NOTE — Transfer of Care (Signed)
 Immediate Anesthesia Transfer of Care Note  Patient: Sheila Myers  Procedure(s) Performed: RADIOLOGY WITH ANESTHESIA  Patient Location: SICU  Anesthesia Type:General  Level of Consciousness: sedated and Patient remains intubated per anesthesia plan  Airway & Oxygen Therapy: Patient remains intubated per anesthesia plan and Patient placed on Ventilator (see vital sign flow sheet for setting)  Post-op Assessment: Report given to RN and Post -op Vital signs reviewed and stable  Post vital signs: Reviewed and stable  Last Vitals:  Vitals Value Taken Time  BP 146/59 10/30/23 19:37  Temp    Pulse 64 10/30/23 19:38  Resp 17 10/30/23 19:38  SpO2 100 % 10/30/23 19:38  Vitals shown include unfiled device data.  Last Pain:  Vitals:   10/30/23 1610  TempSrc: Temporal         Complications: No notable events documented.

## 2023-10-30 NOTE — ED Provider Notes (Incomplete)
 [employer] EMERGENCY DEPARTMENT AT [facility] Provider Note   CSN: [id number] Arrival date & time: 10/30/23  1600     Patient presents with: [place]   Sheila Myers is a 89 y.o. female.  With a history of hypertension, hyperlipidemia, type 2 diabetes and COPD who presents to the ED via EMS as a level 2 trauma after a fall.  Per son, patient was feeling well in her normal state of health earlier today.  She made lunch for herself and her son around 55.  Around 1500 the son noted her up and walking to the bathroom at home.  Shortly thereafter she suffered a fall in the bathroom.  Her son thinks she may have hit the right side of her head on the sink.  She was reporting a right sided headache and holding the right side of her head with her right hand.  Son called EMS.  Upon EMS arrival she was noted to be confused and remained so upon initial assessment here.  Additional history limited at this time secondary to clinical condition.  {Add pertinent medical, surgical, social history, OB history to YEP:67052}  Fall       Prior to Admission medications   Not on File    Allergies: Patient has no known allergies.    Review of Systems  Updated Vital Signs BP (!) 203/83   Pulse 71   Temp (!) 97.1 F (36.2 C) (Temporal)   Resp (!) 21   Ht 5' 3 (1.6 m)   Wt 59 kg   SpO2 100%   BMI 23.03 kg/m   Physical Exam  (all labs ordered are listed, but only abnormal results are displayed) Labs Reviewed  CBG MONITORING, ED - Abnormal; Notable for the following components:      Result Value   Glucose-Capillary 110 (*)    All other components within normal limits  I-STAT CHEM 8, ED - Abnormal; Notable for the following components:   Potassium 2.9 (*)    Glucose, Bld 125 (*)    Hemoglobin 10.5 (*)    HCT 31.0 (*)    All other components within normal limits  COMPREHENSIVE METABOLIC PANEL WITH GFR  CBC  ETHANOL  URINALYSIS, ROUTINE W REFLEX MICROSCOPIC  PROTIME-INR   TRIGLYCERIDES  APTT  DIFFERENTIAL  RAPID URINE DRUG SCREEN, HOSP PERFORMED  I-STAT CG4 LACTIC ACID, ED  SAMPLE TO BLOOD BANK    EKG: None  Radiology: [facility] Chest Port 1 View Result Date: 10/30/2023 CLINICAL DATA:  Fall. EXAM: PORTABLE CHEST 1 VIEW COMPARISON:  Jun 13, 2020. FINDINGS: Stable cardiomediastinal silhouette. Nasogastric tube tip is seen in proximal stomach. Endotracheal tube tip is seen just above the carina; withdrawal by 3-4 cm is recommended. Minimal bibasilar subsegmental atelectasis is noted. Bony thorax is unremarkable. IMPRESSION: Endotracheal tube tip is seen just above the carina; withdrawal by 3-4 cm is recommended. Electronically Signed   By: Lynwood Landy Raddle M.D.   On: 10/30/2023 16:31   DG Pelvis Portable Result Date: 10/30/2023 CLINICAL DATA:  Fall. EXAM: PORTABLE PELVIS 1-2 VIEWS COMPARISON:  None Available. FINDINGS: There is no evidence of acute pelvic fracture or diastasis. Possible old left superior pubic ramus fracture. No pelvic bone lesions are seen. IMPRESSION: No acute abnormality seen. Aortic Atherosclerosis (ICD10-I70.0). Electronically Signed   By: Lynwood Landy Raddle M.D.   On: 10/30/2023 16:30    {Document cardiac monitor, telemetry assessment procedure when appropriate:32947} Procedures   Medications Ordered in the ED  etomidate (AMIDATE)  injection (10 mg Intravenous Given 10/30/23 1612)  succinylcholine (ANECTINE) injection (100 mg Intravenous Given 10/30/23 1613)  propofol (DIPRIVAN) 1000 MG/100ML infusion (60 mcg/kg/min  59 kg Intravenous Rate/Dose Change 10/30/23 1652)  fentaNYL 2500mcg in NS (10mcg/ml) infusion-PREMIX (100 mcg/hr Intravenous New Bag/Given 10/30/23 1625)  fentaNYL (SUBLIMAZE) bolus via infusion 25-100 mcg (100 mcg Intravenous Bolus from Bag 10/30/23 1652)  clevidipine (CLEVIPREX) infusion 0.5 mg/mL (2 mg/hr Intravenous New Bag/Given 10/30/23 1652)  iohexol (OMNIPAQUE) 350 MG/ML injection 80 mL (80 mLs Intravenous Contrast Given  10/30/23 1706)    Clinical Course as of 10/30/23 1709  Thu Oct 30, 2023  1650 Son  [MP]  1703 Neuro code stroke [MP]    Clinical Course User Index [MP] [name], DO   {Click here for ABCD2, HEART and other calculators REFRESH Note before signing:1}                              Medical Decision Making Amount and/or Complexity of Data Reviewed Labs: ordered. Radiology: ordered.  Risk Prescription drug management. Decision regarding hospitalization.   ***  {Document critical care time when appropriate  Document review of labs and clinical decision tools ie CHADS2VASC2, etc  Document your independent review of radiology images and any outside records  Document your discussion with family members, caretakers and with consultants  Document social determinants of health affecting pt's care  Document your decision making why or why not admission, treatments were needed:32947:::1}   Final diagnoses:  None    ED Discharge Orders     None

## 2023-10-31 ENCOUNTER — Encounter (HOSPITAL_COMMUNITY): Payer: Self-pay | Admitting: Radiology

## 2023-10-31 ENCOUNTER — Inpatient Hospital Stay (HOSPITAL_COMMUNITY)

## 2023-10-31 DIAGNOSIS — R9089 Other abnormal findings on diagnostic imaging of central nervous system: Secondary | ICD-10-CM

## 2023-10-31 DIAGNOSIS — J9601 Acute respiratory failure with hypoxia: Secondary | ICD-10-CM

## 2023-10-31 DIAGNOSIS — R569 Unspecified convulsions: Secondary | ICD-10-CM | POA: Diagnosis not present

## 2023-10-31 DIAGNOSIS — I6389 Other cerebral infarction: Secondary | ICD-10-CM

## 2023-10-31 DIAGNOSIS — I7092 Chronic total occlusion of artery of the extremities: Secondary | ICD-10-CM | POA: Diagnosis not present

## 2023-10-31 DIAGNOSIS — G936 Cerebral edema: Secondary | ICD-10-CM | POA: Diagnosis not present

## 2023-10-31 DIAGNOSIS — N179 Acute kidney failure, unspecified: Secondary | ICD-10-CM

## 2023-10-31 DIAGNOSIS — I639 Cerebral infarction, unspecified: Secondary | ICD-10-CM

## 2023-10-31 DIAGNOSIS — I70203 Unspecified atherosclerosis of native arteries of extremities, bilateral legs: Secondary | ICD-10-CM | POA: Diagnosis not present

## 2023-10-31 DIAGNOSIS — R4182 Altered mental status, unspecified: Secondary | ICD-10-CM

## 2023-10-31 DIAGNOSIS — K551 Chronic vascular disorders of intestine: Secondary | ICD-10-CM | POA: Diagnosis not present

## 2023-10-31 DIAGNOSIS — I739 Peripheral vascular disease, unspecified: Secondary | ICD-10-CM

## 2023-10-31 DIAGNOSIS — I701 Atherosclerosis of renal artery: Secondary | ICD-10-CM | POA: Diagnosis not present

## 2023-10-31 LAB — BASIC METABOLIC PANEL WITH GFR
Anion gap: 11 (ref 5–15)
BUN: 17 mg/dL (ref 8–23)
CO2: 15 mmol/L — ABNORMAL LOW (ref 22–32)
Calcium: 8.2 mg/dL — ABNORMAL LOW (ref 8.9–10.3)
Chloride: 111 mmol/L (ref 98–111)
Creatinine, Ser: 1.27 mg/dL — ABNORMAL HIGH (ref 0.44–1.00)
GFR, Estimated: 39 mL/min — ABNORMAL LOW (ref >60)
Glucose, Bld: 249 mg/dL — ABNORMAL HIGH (ref 70–99)
Potassium: 3.9 mmol/L (ref 3.5–5.1)
Sodium: 137 mmol/L (ref 135–145)

## 2023-10-31 LAB — ECHOCARDIOGRAM COMPLETE
Calc EF: 41.6 %
Est EF: 40
Height: 63 in
S' Lateral: 2.8 cm
Single Plane A2C EF: 34.4 %
Single Plane A4C EF: 40.9 %
Weight: 1943.58 [oz_av]

## 2023-10-31 LAB — GLUCOSE, CAPILLARY
Glucose-Capillary: 125 mg/dL — ABNORMAL HIGH (ref 70–99)
Glucose-Capillary: 134 mg/dL — ABNORMAL HIGH (ref 70–99)
Glucose-Capillary: 166 mg/dL — ABNORMAL HIGH (ref 70–99)
Glucose-Capillary: 182 mg/dL — ABNORMAL HIGH (ref 70–99)

## 2023-10-31 LAB — SODIUM
Sodium: 140 mmol/L (ref 135–145)
Sodium: 145 mmol/L (ref 135–145)

## 2023-10-31 LAB — LIPID PANEL
Cholesterol: 99 mg/dL (ref 0–200)
HDL: 36 mg/dL — ABNORMAL LOW (ref >40)
LDL Cholesterol: 48 mg/dL (ref 0–99)
Total CHOL/HDL Ratio: 2.8 ratio
Triglycerides: 73 mg/dL (ref <150)
VLDL: 15 mg/dL (ref 0–40)

## 2023-10-31 LAB — TRIGLYCERIDES: Triglycerides: 71 mg/dL (ref <150)

## 2023-10-31 MED ORDER — POTASSIUM CHLORIDE 10 MEQ/100ML IV SOLN
INTRAVENOUS | Status: AC
  Filled 2023-10-31: qty 100

## 2023-10-31 MED ORDER — LACTATED RINGERS IV SOLN: INTRAVENOUS | Status: DC

## 2023-10-31 MED ORDER — POTASSIUM CHLORIDE 20 MEQ PO PACK
40.0000 meq | PACK | ORAL | Status: AC
  Administered 2023-10-31 (×4): 40 meq
  Filled 2023-10-31 (×2): qty 2

## 2023-10-31 MED ORDER — SODIUM CHLORIDE 3 % IV BOLUS
250.0000 mL | Freq: Once | INTRAVENOUS | Status: AC
  Administered 2023-10-31 (×2): 250 mL via INTRAVENOUS
  Filled 2023-10-31: qty 500

## 2023-10-31 MED ORDER — ACETAMINOPHEN 325 MG PO TABS
650.0000 mg | ORAL_TABLET | ORAL | Status: DC | PRN
  Administered 2023-10-31 (×2): 650 mg via NASOGASTRIC
  Filled 2023-10-31: qty 2

## 2023-10-31 MED ORDER — IOHEXOL 350 MG/ML SOLN
50.0000 mL | Freq: Once | INTRAVENOUS | Status: AC | PRN
  Administered 2023-10-31 (×2): 50 mL via INTRAVENOUS

## 2023-10-31 MED ORDER — INSULIN ASPART 100 UNIT/ML IJ SOLN
0.0000 [IU] | INTRAMUSCULAR | Status: DC
  Administered 2023-10-31: 2 [IU] via SUBCUTANEOUS
  Administered 2023-10-31 (×2): 1 [IU] via SUBCUTANEOUS
  Administered 2023-10-31: 2 [IU] via SUBCUTANEOUS
  Administered 2023-10-31 – 2023-11-01 (×3): 1 [IU] via SUBCUTANEOUS
  Administered 2023-11-01: 2 [IU] via SUBCUTANEOUS
  Administered 2023-11-01 (×2): 1 [IU] via SUBCUTANEOUS
  Administered 2023-11-01: 2 [IU] via SUBCUTANEOUS
  Administered 2023-11-01: 1 [IU] via SUBCUTANEOUS

## 2023-10-31 MED ORDER — SODIUM CHLORIDE 0.9 % IV SOLN: INTRAVENOUS | Status: AC | PRN

## 2023-10-31 MED ORDER — SODIUM CHLORIDE 3 % IV SOLN
INTRAVENOUS | Status: DC
  Filled 2023-10-31: qty 500

## 2023-10-31 MED ORDER — LEVETIRACETAM (KEPPRA) 500 MG/5 ML ADULT IV PUSH
500.0000 mg | Freq: Two times a day (BID) | INTRAVENOUS | Status: DC
  Administered 2023-11-01 (×2): 500 mg via INTRAVENOUS
  Filled 2023-10-31: qty 5

## 2023-10-31 MED ORDER — LEVETIRACETAM (KEPPRA) 500 MG/5 ML ADULT IV PUSH
1500.0000 mg | Freq: Once | INTRAVENOUS | Status: AC
  Administered 2023-10-31 (×2): 1500 mg via INTRAVENOUS
  Filled 2023-10-31: qty 15

## 2023-10-31 MED ORDER — FAMOTIDINE 20 MG PO TABS
20.0000 mg | ORAL_TABLET | Freq: Every day | ORAL | Status: DC
Start: 2023-11-01 — End: 2023-11-01

## 2023-10-31 NOTE — Progress Notes (Addendum)
 STROKE TEAM PROGRESS NOTE    SIGNIFICANT HOSPITAL EVENTS 10/2 received IV TNK for right ICA occlusion and thrombectomy. Intubated in ED   INTERIM HISTORY/SUBJECTIVE No family at the bedside. Overnight events reviewed. She remains intubated on no sedation.  Neurological exam remains poor.  Patient is intubated.  Not sedated. Exam: eyes are closed and she is not following commands, trace withdrawal in extremities. Right foot with rhythmic twitching will check routine EEG.  Repeat CT head scheduled for 1700 tonight   EEG with Continuous slow, generalized  and lateralized right hemisphere    CBC    Component Value Date/Time   WBC 6.5 10/30/2023 1653   WBC 6.5 10/30/2023 1653   RBC 4.50 10/30/2023 1653   RBC 4.46 10/30/2023 1653   HGB 10.5 (L) 10/30/2023 1744   HCT 31.0 (L) 10/30/2023 1744   PLT 175 10/30/2023 1653   PLT 172 10/30/2023 1653   MCV 72.7 (L) 10/30/2023 1653   MCV 72.0 (L) 10/30/2023 1653   MCH 22.2 (L) 10/30/2023 1653   MCH 22.6 (L) 10/30/2023 1653   MCHC 30.6 10/30/2023 1653   MCHC 31.5 10/30/2023 1653   RDW 16.8 (H) 10/30/2023 1653   RDW 17.1 (H) 10/30/2023 1653   LYMPHSABS 0.8 10/30/2023 1708   MONOABS 0.5 10/30/2023 1708   EOSABS 0.1 10/30/2023 1708   BASOSABS 0.0 10/30/2023 1708    BMET    Component Value Date/Time   NA 137 10/31/2023 0831   K 3.9 10/31/2023 0831   CL 111 10/31/2023 0831   CO2 15 (L) 10/31/2023 0831   GLUCOSE 249 (H) 10/31/2023 0831   BUN 17 10/31/2023 0831   CREATININE 1.27 (H) 10/31/2023 0831   CALCIUM 8.2 (L) 10/31/2023 0831   GFRNONAA 39 (L) 10/31/2023 0831    IMAGING past 24 hours EEG adult Result Date: 10/31/2023 Shelton Arlin KIDD, MD     10/31/2023 12:20 PM Patient Name: Sheila Myers MRN: [id number] Epilepsy Attending: Arlin KIDD Shelton Referring Physician/Provider: Waddell Karna LABOR, NP Date: 10/31/2023 Duration: 30.21 mins Patient history: 89yo f with ams. EEG to evaluate for seizure Level of alertness: Awake/ lethargic AEDs  during EEG study: None Technical aspects: This EEG study was done with scalp electrodes positioned according to the 10-20 International system of electrode placement. Electrical activity was reviewed with band pass filter of 1-70Hz , sensitivity of 7 uV/mm, display speed of 74mm/sec with a 60Hz  notched filter applied as appropriate. EEG data were recorded continuously and digitally stored.  Video monitoring was available and reviewed as appropriate. Description: EEG showed continuous generalized and lateralized right hemisphere 3 to 6 Hz theta-delta slowing. Hyperventilation and photic stimulation were not performed.   Of note, study was technically difficult due to significant myogenic artifact ABNORMALITY - Continuous slow, generalized  and lateralized right hemisphere IMPRESSION: This technically difficult  study is suggestive of cortical dysfunction arising from  right hemisphere likely secondary to underlying structural abnormality. Additionally there is moderate diffuse encephalopathy. No seizures or epileptiform discharges were seen throughout the recording. Priyanka O Yadav   CT ANGIO AO+BIFEM W & OR WO CONTRAST Result Date: 10/31/2023 EXAM: CTA ABDOMEN AND PELVIS WITH AND WITHOUT CONTRAST AND RUNOFF CTA OF THE LOWER EXTREMITIES WITH CONTRAST 10/31/2023 04:18:22 AM TECHNIQUE: CTA images of the abdomen, pelvis and lower extremities with and without intravenous contrast. 50 mL iohexol (OMNIPAQUE) 350 MG/ML injection was administered. Three-dimensional MIP/volume rendered formations were performed. Automated exposure control, iterative reconstruction, and/or weight based adjustment of the mA/kV was utilized to reduce  the radiation dose to as low as reasonably achievable. COMPARISON: None available. CLINICAL HISTORY: Claudication or leg ischemia; decreased pulse RLE. FINDINGS: VASCULATURE: AORTA: There is extensive calcific plaque present within the aortoiliac arteries. No acute finding. No abdominal aortic  aneurysm. No dissection. CELIAC TRUNK: There is moderate calcific plaque within the mesenteric arteries, with less than 50% stenosis of the celiac artery. No acute finding. No occlusion. SUPERIOR MESENTERIC ARTERY: There is moderate calcific plaque within the mesenteric arteries, with approximately 50% stenosis of the proximal superior mesenteric artery. No acute finding. No occlusion. INFERIOR MESENTERIC ARTERY: No acute finding. No occlusion or significant stenosis. RENAL ARTERIES: There is severe stenosis of the proximal right renal artery and moderate-to-severe stenosis of the left renal artery. No acute finding. No occlusion. RIGHT ILIAC ARTERIES: There is extensive calcific plaque present within the aortoiliac arteries. No acute finding. No occlusion or significant stenosis beyond this. RIGHT FEMORAL ARTERIES: There is extensive calcific atheromatous disease within the right common femoral artery with severe stenosis. There is complete occlusion of the right superficial femoral artery at its origin. RIGHT POPLITEAL ARTERY: The popliteal and Infrapopliteal arteries appear to be occluded bilaterally. RIGHT CALF ARTERIES: The popliteal and Infrapopliteal arteries appear to be occluded bilaterally. LEFT ILIAC ARTERIES: There is extensive calcific plaque present within the aortoiliac arteries. No acute finding. No occlusion or significant stenosis beyond this. LEFT FEMORAL ARTERIES: There is severe stenosis of the left common femoral artery. The superficial femoral artery is completely occluded. The left profunda femoral artery is supplied by muscular collaterals. LEFT POPLITEAL ARTERY: The popliteal and Infrapopliteal arteries appear to be occluded bilaterally. LEFT CALF ARTERIES: The popliteal and Infrapopliteal arteries appear to be occluded bilaterally. General Vascular: There is extensive calcific atheromatous plaque present throughout. ABDOMEN AND PELVIS: LOWER CHEST: There is atelectasis present dependently  within the lungs bilaterally. There is some mild right-sided pleural effusion. The heart is enlarged and there is calcific coronary artery disease. LIVER: The liver is unremarkable. GALLBLADDER AND BILE DUCTS: Gallbladder is unremarkable. No biliary ductal dilatation. SPLEEN: The spleen is unremarkable. PANCREAS: The pancreas is unremarkable. ADRENAL GLANDS: Bilateral adrenal glands demonstrate no acute abnormality. KIDNEYS, URETERS AND BLADDER: No stones in the kidneys or ureters. No hydronephrosis. No evidence of perinephric or periureteral stranding. A Foley catheter is within the urinary bladder. GI AND Bowel: A gastric tube is present. There are numerous colonic diverticula present. There is thickening of the wall of the ascending and transverse colon. Stomach and duodenal sweep demonstrate no acute abnormality. There is no bowel obstruction. No abnormal bowel wall thickening or distension beyond what is described. REPRODUCTIVE: Reproductive organs are unremarkable. PERITONEUM AND RETRPERITONEUM: No ascites or free air. LYMPH NODES: No evidence of lymphadenopathy. BONES AND SOFT TISSUES: No acute abnormality of the bones. No acute soft tissue abnormality. IMPRESSION: 1. Extensive multivessel atherosclerotic disease with severe stenoses and occlusions, including: severe right renal artery stenosis, moderate-to-severe left renal artery stenosis; approximately 50% proximal superior mesenteric artery stenosis; severe right and left common femoral artery stenoses; complete occlusion of the right and left superficial femoral arteries; and probable occlusions of the popliteal and infrapopliteal arteries bilaterally. 2. Colonic wall thickening involving the ascending and transverse colon. Electronically signed by: Evalene Coho MD 10/31/2023 04:48 AM EDT RP Workstation: [id number]   CT HEAD WO CONTRAST ( ) Result Date: 10/30/2023 EXAM: CT HEAD WITHOUT CONTRAST 10/30/2023 10:39:59 PM TECHNIQUE: CT of the head  was performed without the administration of intravenous contrast. Automated exposure control, iterative reconstruction, and/or weight  based adjustment of the mA/kV was utilized to reduce the radiation dose to as low as reasonably achievable. COMPARISON: Head CT 10/30/2023. CLINICAL HISTORY: Altered mental status, nontraumatic (Ped 0-17y). AMS. FINDINGS: BRAIN AND VENTRICLES: There is subarachnoid hyperdensity over the right MCA territory and is favored to indicate contrast staining. No acute hemorrhage. No evidence of acute infarct. No hydrocephalus. No extra-axial collection. No mass effect or midline shift. ORBITS: No acute abnormality. SINUSES: Left maxillary sinus retention cyst. SOFT TISSUES AND SKULL: No acute soft tissue abnormality. No skull fracture. IMPRESSION: 1. Subarachnoid hyperdensity over the right MCA territory, favored to represent contrast staining rather than acute subarachnoid hemorrhage. Follow up imaging will be helpful in differentiation. Electronically signed by: Franky Stanford MD 10/30/2023 11:01 PM EDT RP Workstation: [id number]   IR PERCUTANEOUS ART THROMBECTOMY/INFUSION INTRACRANIAL INC DIAG ANGIO Result Date: 10/30/2023 PROCEDURE PERFORMED: 1. Cerebral angiography with stroke thrombectomy 2. Ultrasound guided vascular access 3. Cone beam CT for treatment planning COMPARISON:  CT angiogram of the head and neck performed October 30, 2023 CLINICAL DATA:  89 year old female with acute ischemic stroke. Symptoms include aphasia and weakness with an NIH stroke scale measuring greater than 20. The patient was intubated for airway control and mental status changes. The patient was a candidate for thrombo lytic therapy and was treated with T NK. The patient then presented for mechanical thrombectomy. INDICATION: Acute ischemic stroke. ANESTHESIA/SEDATION: General anesthesia was utilized for the procedure. CONTRAST:  Approximately 80 cc Ominipque 300 MEDICATIONS: See MAR FLUOROSCOPY TIME:   Fluoroscopy Time: 17 minutes 18 sec, (853 mGy). COMPLICATIONS: None immediate. BODY OF REPORT: Following a full explanation of the procedure along with the potential associated complications, an informed witnessed consent was obtained. The patient was treated under general anesthesia by the Department of Anesthesiology at [facility]. The right femoral access site was prepped and draped in the usual sterile fashion. Ultrasound was used to study the right common femoral artery which was patent. Using real-time ultrasound guidance, a 19 gauge introducer needle was used to access the right common femoral artery. Access was performed at 18:08. A hard copy image ultrasound the saved and stored in PACS. Using this access, a 6 French sheath was placed in the descending thoracic aorta. Next, selective catheterization the right common carotid artery was performed. A selective arteriogram was performed which demonstrated embolic occlusion of the proximal right internal carotid artery extending throughout its course. Pretreatment TICI score 0. Next, red 72 catheter was advanced into the right internal carotid artery and suction thrombectomy was performed in the direction of flow. The first pass was performed at 18:19. Next, selective catheterization of the right M1 segment was performed with a penumbra 43 catheter. Suction thrombectomy was performed of the right M1 segment through the penumbra 43 and red 72 catheter. The second pass was performed at 1825. A post treatment arteriogram demonstrated restoration of flow into the right ICA and proximal M1 territory. A third pass was then performed in the distal right M1 segment using the penumbra 43 and red 72 catheter. This was performed at 18:31. Restoration of flow was achieved in the MCA territory. There was no significant filling defect peripherally. Oblique imaging was obtained which demonstrated residual thrombo embolus within the proximal anterior cerebral artery on  the right. Briefly, I attempted to catheterize this vessel and performed suction thrombectomy, however, elected to abort due to vessel tortuosity. Of note, a mild stenosis was present in the proximal right internal carotid artery which was estimated at 25%  based on post recanalization imaging using NASCET criteria. Post treatment TICI score 2b. After reviewing the imaging, I elected to terminate the procedure at this point. Evaluation of the right femoral access site demonstrated that the site was suitable for a closure device. A 6 French Angio-Seal device was deployed without complication. Cone beam CT was then performed to evaluate for intracranial hemorrhage and treatment planning. This demonstrated minimal contrast staining in the treated territory. The patient was then transferred to the ICU in stable condition. IMPRESSION: 1. Suction thrombectomy of tandem occlusions of the right internal carotid artery and MCA territory. 2. Residual embolic material was present in the anterior cerebral artery on the right which was not amenable to thrombectomy due to vessel tortuosity. 3. Post treatment TICI score 2b. PLAN: 1. To ICU for routine postoperative supportive care. Electronically Signed   By: Maude Naegeli M.D.   On: 10/30/2023 20:04   IR US  Guide Vasc Access Right Result Date: 10/30/2023 PROCEDURE PERFORMED: 1. Cerebral angiography with stroke thrombectomy 2. Ultrasound guided vascular access 3. Cone beam CT for treatment planning COMPARISON:  CT angiogram of the head and neck performed October 30, 2023 CLINICAL DATA:  89 year old female with acute ischemic stroke. Symptoms include aphasia and weakness with an NIH stroke scale measuring greater than 20. The patient was intubated for airway control and mental status changes. The patient was a candidate for thrombo lytic therapy and was treated with T NK. The patient then presented for mechanical thrombectomy. INDICATION: Acute ischemic stroke. ANESTHESIA/SEDATION:  General anesthesia was utilized for the procedure. CONTRAST:  Approximately 80 cc Ominipque 300 MEDICATIONS: See MAR FLUOROSCOPY TIME:  Fluoroscopy Time: 17 minutes 18 sec, (853 mGy). COMPLICATIONS: None immediate. BODY OF REPORT: Following a full explanation of the procedure along with the potential associated complications, an informed witnessed consent was obtained. The patient was treated under general anesthesia by the Department of Anesthesiology at [facility]. The right femoral access site was prepped and draped in the usual sterile fashion. Ultrasound was used to study the right common femoral artery which was patent. Using real-time ultrasound guidance, a 19 gauge introducer needle was used to access the right common femoral artery. Access was performed at 18:08. A hard copy image ultrasound the saved and stored in PACS. Using this access, a 6 French sheath was placed in the descending thoracic aorta. Next, selective catheterization the right common carotid artery was performed. A selective arteriogram was performed which demonstrated embolic occlusion of the proximal right internal carotid artery extending throughout its course. Pretreatment TICI score 0. Next, red 72 catheter was advanced into the right internal carotid artery and suction thrombectomy was performed in the direction of flow. The first pass was performed at 18:19. Next, selective catheterization of the right M1 segment was performed with a penumbra 43 catheter. Suction thrombectomy was performed of the right M1 segment through the penumbra 43 and red 72 catheter. The second pass was performed at 1825. A post treatment arteriogram demonstrated restoration of flow into the right ICA and proximal M1 territory. A third pass was then performed in the distal right M1 segment using the penumbra 43 and red 72 catheter. This was performed at 18:31. Restoration of flow was achieved in the MCA territory. There was no significant filling  defect peripherally. Oblique imaging was obtained which demonstrated residual thrombo embolus within the proximal anterior cerebral artery on the right. Briefly, I attempted to catheterize this vessel and performed suction thrombectomy, however, elected to abort due to  vessel tortuosity. Of note, a mild stenosis was present in the proximal right internal carotid artery which was estimated at 25% based on post recanalization imaging using NASCET criteria. Post treatment TICI score 2b. After reviewing the imaging, I elected to terminate the procedure at this point. Evaluation of the right femoral access site demonstrated that the site was suitable for a closure device. A 6 French Angio-Seal device was deployed without complication. Cone beam CT was then performed to evaluate for intracranial hemorrhage and treatment planning. This demonstrated minimal contrast staining in the treated territory. The patient was then transferred to the ICU in stable condition. IMPRESSION: 1. Suction thrombectomy of tandem occlusions of the right internal carotid artery and MCA territory. 2. Residual embolic material was present in the anterior cerebral artery on the right which was not amenable to thrombectomy due to vessel tortuosity. 3. Post treatment TICI score 2b. PLAN: 1. To ICU for routine postoperative supportive care. Electronically Signed   By: Maude Naegeli M.D.   On: 10/30/2023 20:04   IR CT Head Ltd Result Date: 10/30/2023 PROCEDURE PERFORMED: 1. Cerebral angiography with stroke thrombectomy 2. Ultrasound guided vascular access 3. Cone beam CT for treatment planning COMPARISON:  CT angiogram of the head and neck performed October 30, 2023 CLINICAL DATA:  89 year old female with acute ischemic stroke. Symptoms include aphasia and weakness with an NIH stroke scale measuring greater than 20. The patient was intubated for airway control and mental status changes. The patient was a candidate for thrombo lytic therapy and was  treated with T NK. The patient then presented for mechanical thrombectomy. INDICATION: Acute ischemic stroke. ANESTHESIA/SEDATION: General anesthesia was utilized for the procedure. CONTRAST:  Approximately 80 cc Ominipque 300 MEDICATIONS: See MAR FLUOROSCOPY TIME:  Fluoroscopy Time: 17 minutes 18 sec, (853 mGy). COMPLICATIONS: None immediate. BODY OF REPORT: Following a full explanation of the procedure along with the potential associated complications, an informed witnessed consent was obtained. The patient was treated under general anesthesia by the Department of Anesthesiology at [facility]. The right femoral access site was prepped and draped in the usual sterile fashion. Ultrasound was used to study the right common femoral artery which was patent. Using real-time ultrasound guidance, a 19 gauge introducer needle was used to access the right common femoral artery. Access was performed at 18:08. A hard copy image ultrasound the saved and stored in PACS. Using this access, a 6 French sheath was placed in the descending thoracic aorta. Next, selective catheterization the right common carotid artery was performed. A selective arteriogram was performed which demonstrated embolic occlusion of the proximal right internal carotid artery extending throughout its course. Pretreatment TICI score 0. Next, red 72 catheter was advanced into the right internal carotid artery and suction thrombectomy was performed in the direction of flow. The first pass was performed at 18:19. Next, selective catheterization of the right M1 segment was performed with a penumbra 43 catheter. Suction thrombectomy was performed of the right M1 segment through the penumbra 43 and red 72 catheter. The second pass was performed at 1825. A post treatment arteriogram demonstrated restoration of flow into the right ICA and proximal M1 territory. A third pass was then performed in the distal right M1 segment using the penumbra 43 and red 72  catheter. This was performed at 18:31. Restoration of flow was achieved in the MCA territory. There was no significant filling defect peripherally. Oblique imaging was obtained which demonstrated residual thrombo embolus within the proximal anterior cerebral artery on  the right. Briefly, I attempted to catheterize this vessel and performed suction thrombectomy, however, elected to abort due to vessel tortuosity. Of note, a mild stenosis was present in the proximal right internal carotid artery which was estimated at 25% based on post recanalization imaging using NASCET criteria. Post treatment TICI score 2b. After reviewing the imaging, I elected to terminate the procedure at this point. Evaluation of the right femoral access site demonstrated that the site was suitable for a closure device. A 6 French Angio-Seal device was deployed without complication. Cone beam CT was then performed to evaluate for intracranial hemorrhage and treatment planning. This demonstrated minimal contrast staining in the treated territory. The patient was then transferred to the ICU in stable condition. IMPRESSION: 1. Suction thrombectomy of tandem occlusions of the right internal carotid artery and MCA territory. 2. Residual embolic material was present in the anterior cerebral artery on the right which was not amenable to thrombectomy due to vessel tortuosity. 3. Post treatment TICI score 2b. PLAN: 1. To ICU for routine postoperative supportive care. Electronically Signed   By: Maude Naegeli M.D.   On: 10/30/2023 20:04   CT C-SPINE NO CHARGE Result Date: 10/30/2023 CLINICAL DATA:  Provided history: Neck trauma.  Fall. EXAM: CT CERVICAL SPINE WITHOUT CONTRAST TECHNIQUE: Multidetector CT imaging of the cervical spine was performed without intravenous contrast. Multiplanar CT image reconstructions were also generated. RADIATION DOSE REDUCTION: This exam was performed according to the departmental dose-optimization program which includes  automated exposure control, adjustment of the mA and/or kV according to patient size and/or use of iterative reconstruction technique. COMPARISON:  Cervical spine CT 09/18/2021. FINDINGS: Alignment: Slight grade 1 anterolisthesis at C2-C3, C7-T1, T1-T2 and T2-T3. Slight grade 1 retrolisthesis at C3-C4 and C4-C5. Skull base and vertebrae: The basion-dental and atlanto-dental intervals are maintained.No evidence of acute fracture to the cervical spine. Soft tissues and spinal canal: No prevertebral fluid or swelling. No visible canal hematoma. Disc levels: Cervical spondylosis with multilevel disc space narrowing, disc bulges/central disc protrusions, posterior disc osteophyte complexes, uncovertebral hypertrophy and facet arthropathy. Disc space narrowing is greatest at C3-C4, C4-C5, C5-C6 and C6-C7 (advanced at these levels). No appreciable high-grade spinal canal stenosis. Multilevel bony neural foraminal narrowing. Degenerative changes also present at the C1-C2 articulation. Upper chest: Separately reported on same day CT chest/abdomen/pelvis. IMPRESSION: 1. No evidence of an acute cervical spine fracture. 2. Slight grade 1 spondylolisthesis at C2-C3, C3-C4, C4-C5, C7-T1, T1-T2 and T2-T3. 3. Cervical spondylosis as described. Electronically Signed   By: Rockey Childs D.O.   On: 10/30/2023 17:51   CT ANGIO HEAD NECK W WO CM Result Date: 10/30/2023 CLINICAL DATA:  Provided history: Head trauma, moderate/severe. EXAM: CT ANGIOGRAPHY HEAD AND NECK WITH AND WITHOUT CONTRAST TECHNIQUE: Multidetector CT imaging of the head and neck was performed using the standard protocol during bolus administration of intravenous contrast. Multiplanar CT image reconstructions and MIPs were obtained to evaluate the vascular anatomy. Carotid stenosis measurements (when applicable) are obtained utilizing NASCET criteria, using the distal internal carotid diameter as the denominator. RADIATION DOSE REDUCTION: This exam was performed  according to the departmental dose-optimization program which includes automated exposure control, adjustment of the mA and/or kV according to patient size and/or use of iterative reconstruction technique. CONTRAST:  Administered contrast not known at this time. COMPARISON:  Head CT 09/18/2021. Brain MRI 08/05/2015. FINDINGS: CT HEAD FINDINGS Brain: Mild generalized cerebral atrophy. Subtle loss of gray-white differentiation at the right insula likely reflecting an acute right middle cerebral  artery territory infarct. Background mild patchy and ill-defined hypoattenuation within the cerebral white matter, nonspecific but compatible with chronic small ischemic disease. Chronic lacunar infarct within the right thalamus, unchanged. Known small chronic infarcts within the bilateral cerebellar hemispheres. Partial empty sella turcica. There is no acute intracranial hemorrhage. No extra-axial fluid collection. No evidence of an intracranial mass. No midline shift. Vascular: Reported below. Skull: No calvarial fracture or aggressive osseous lesion. Sinuses/Orbits: Left periorbital hematoma. Near complete opacification of the left maxillary sinus. Mild opacification of bilateral ethmoid air cells. Review of the MIP images confirms the above findings CTA NECK FINDINGS Aortic arch: Standard aortic branching. Atherosclerotic plaque within the aortic arch and proximal major branch vessels of the neck. No hemodynamically significant innominate or proximal subclavian artery stenosis. Right carotid system: The common carotid artery is patent. Predominantly calcified plaque about the carotid bifurcation and within the proximal ICA. The right ICA is occluded at its origin and remains occluded throughout the remainder of the neck. Left carotid system: CCA and ICA patent within the neck. Nonstenotic calcified plaque at the CCA origin. Atherosclerotic plaque about the carotid bifurcation and within the proximal ICA. Apparent 70%  stenosis of the proximal ICA. Vertebral arteries: Codominant and patent within the neck. Severe atherosclerotic stenosis at the right vertebral artery origin. Atherosclerotic plaque within the vertebral artery V3 and V4 segments with sites of severe stenosis, bilaterally. Calcified atherosclerotic plaque at the left vertebral artery origin resulting in at least moderate stenosis. Skeleton: Spondylosis at the cervical and visible upper thoracic levels. No acute fracture or aggressive osseous lesion. Other neck: Multiple thyroid nodules, the largest within the left lobe measuring 17 mm (series 14, image 232). No hematoma is identified within the neck. Upper chest: Several reported on same day CT chest/abdomen/pelvis. Review of the MIP images confirms the above findings CTA HEAD FINDINGS Anterior circulation: The right internal carotid artery remains occluded intracranially. The right middle cerebral artery is occluded at its origin. The right anterior cerebral artery A1 segment is occluded at its very proximal aspect. There is reconstitution of enhancement more distally within the right A1 segment (due to cross flow via the anterior communicating artery). The intracranial left internal carotid artery is patent. Atherosclerotic plaque within this vessel with sites of up to moderate stenosis. The left middle cerebral artery M1 segment is patent. Atherosclerotic irregularity of M2 and more distal left MCA branches. No left M2 proximal branch occlusion or high-grade proximal stenosis is identified. The left anterior cerebral artery is patent. No intracranial aneurysm is identified. Posterior circulation: The intracranial vertebral arteries are patent. Atherosclerotic plaque within the V3 and V4 vertebral arteries with sites of severe stenosis, bilaterally. The basilar artery is patent. Mild atherosclerotic irregularity of the basilar artery. Fetal origin left PCA which is patent. Unchanged from the prior MRA head of  08/05/2015, only a single vessel is visualized arising from the distal basilar artery on the right. It is unclear if this is the right posterior cerebral artery, right superior cerebellar artery or a vessel contributing vascular supply to both of these territories. Venous sinuses: Limited assessment for dural venous sinus thrombosis due to contrast timing. Anatomic variants: As described. Review of the MIP images confirms the above findings Non-contrast head CT impression #1, CTA neck impression #1 and CTA head impression #1 called by telephone at the time of interpretation on 10/30/2023 at 5:00 pm to provider Dr. Dann Hummer, who verbally acknowledged these results. These results were also discussed with Jorene Lulas of Neurology  at 5:20 p.m. on 10/30/2023. IMPRESSION: Non-contrast head CT: 1. Subtle loss of gray-white differentiation at the right insula, likely reflecting an acute right middle cerebral artery territory infarct. 2. No acute intracranial hemorrhage. 3. Known small chronic infarcts within the right thalamus and bilateral cerebellar hemispheres. 4. Background parenchymal atrophy and chronic small vessel ischemic disease. 5. Left periorbital hematoma. CTA neck: 1. The right internal carotid artery is occluded at its origin and remains occluded throughout the remainder of the neck. 2. The left common and internal carotid arteries are patent within the neck. Apparent 70% stenosis of the proximal left ICA. However, this stenosis could potentially be exaggerated by blooming artifact from calcified plaque at this site. Consider a carotid artery duplex when feasible. 3. Vertebral arteries patent within the neck. Severe atherosclerotic stenosis at the right vertebral artery origin. Atherosclerotic plaque within the vertebral artery V3 and V4 segments with sites of severe stenosis, bilaterally. Calcified atherosclerotic plaque at the left vertebral artery origin resulting in at least moderate stenosis. 4.  Aortic Atherosclerosis (ICD10-I70.0). 5. Multiple thyroid nodules, the largest within the left lobe measuring 17 mm. Given the patient's age, a non-emergent thyroid ultrasound may be obtained for further evaluation if clinically appropriate. Reference: J Am Coll Radiol. 2015 Feb;12(2): 143-50. CTA head: 1. The right internal carotid artery remains occluded intracranially. The right middle cerebral artery is occluded at its origin. Additionally, the right anterior cerebral artery A1 segment is occluded at its very proximal aspect. There is reconstitution of enhancement within the right A1 segment more distally (due to cross flow via the anterior communicating artery). 2. As before, only a single vessel is visualized arising from the distal basilar artery on the right. It is uncertain if this is the right posterior cerebral artery, right superior cerebellar artery or a vessel contributing supply to both of these vascular territories. 3. Background intracranial atherosclerotic disease as described within the body of the report. 4. Sites of up to moderate stenosis within the intracranial left internal carotid artery. 5. Atherosclerotic plaque within the vertebral artery V3 and V4 segments with sites of severe stenosis, bilaterally. Electronically Signed   By: Rockey Childs D.O.   On: 10/30/2023 17:45   CT CHEST ABDOMEN PELVIS W CONTRAST Result Date: 10/30/2023 EXAM: CT CHEST, ABDOMEN AND PELVIS WITH CONTRAST 10/30/2023 05:02:07 PM TECHNIQUE: CT of the chest, abdomen and pelvis was performed with the administration of 80 mL of iohexol (OMNIPAQUE) 350 MG/ML injection. Multiplanar reformatted images are provided for review. Automated exposure control, iterative reconstruction, and/or weight based adjustment of the mA/kV was utilized to reduce the radiation dose to as low as reasonably achievable. COMPARISON: 04/01/2008, 10/30/2023 CLINICAL HISTORY: Polytrauma, blunt. Level 1 trauma. Patient fell. Not responsive after  fall. FINDINGS: CHEST: MEDIASTINUM AND LYMPH NODES: Endotracheal Tube tip at carina, recommend retracting 2 - 3 cm. The heart is enlarged with trace pericardial effusion. Atherosclerosis of the aorta and coronary vasculature. The central airways are clear. No mediastinal, hilar or axillary lymphadenopathy. LUNGS AND PLEURA: Small free-flowing bilateral pleural effusions, right greater than left, with dependent bilateral lower lobe atelectasis. No pneumothorax. ABDOMEN AND PELVIS: LIVER: The liver is unremarkable. GALLBLADDER AND BILE DUCTS: Gallbladder is unremarkable. No biliary ductal dilatation. SPLEEN: No acute abnormality. PANCREAS: No acute abnormality. ADRENAL GLANDS: No acute abnormality. KIDNEYS, URETERS AND BLADDER: No stones in the kidneys or ureters. No hydronephrosis. No perinephric or periureteral stranding. Urinary bladder is unremarkable. GI AND BOWEL: Enteric catheter tip within the gastric lumen. Stomach demonstrates no acute  abnormality. There is no bowel obstruction. Incidental note of rectal prolapse. REPRODUCTIVE ORGANS: No acute abnormality. PERITONEUM AND RETROPERITONEUM: No ascites. No free air. VASCULATURE: Aorta is normal in caliber. Atherosclerosis throughout the abdominal aorta. ABDOMINAL AND PELVIS LYMPH NODES: No lymphadenopathy. REPRODUCTIVE ORGANS: No acute abnormality. BONES AND SOFT TISSUES: No acute osseous abnormality. No focal soft tissue abnormality. IMPRESSION: 1. Endotracheal tube tip at the carina; recommend retraction by 23 cm. 2. No acute traumatic abnormalities identified in the chest, abdomen, or pelvis. 3. Enlarged heart with trace pericardial effusion. 4. Small free-flowing bilateral pleural effusions, right greater than left, with dependent bilateral lower lobe atelectasis. Findings were called to Dr. Sebastian at 5:06 pm. Electronically signed by: Ozell Daring MD 10/30/2023 05:12 PM EDT RP Workstation: [id number]   DG Chest Port 1 View Result Date:  10/30/2023 CLINICAL DATA:  Fall. EXAM: PORTABLE CHEST 1 VIEW COMPARISON:  Jun 13, 2020. FINDINGS: Stable cardiomediastinal silhouette. Nasogastric tube tip is seen in proximal stomach. Endotracheal tube tip is seen just above the carina; withdrawal by 3-4 cm is recommended. Minimal bibasilar subsegmental atelectasis is noted. Bony thorax is unremarkable. IMPRESSION: Endotracheal tube tip is seen just above the carina; withdrawal by 3-4 cm is recommended. Electronically Signed   By: Lynwood Landy Raddle M.D.   On: 10/30/2023 16:31   DG Pelvis Portable Result Date: 10/30/2023 CLINICAL DATA:  Fall. EXAM: PORTABLE PELVIS 1-2 VIEWS COMPARISON:  None Available. FINDINGS: There is no evidence of acute pelvic fracture or diastasis. Possible old left superior pubic ramus fracture. No pelvic bone lesions are seen. IMPRESSION: No acute abnormality seen. Aortic Atherosclerosis (ICD10-I70.0). Electronically Signed   By: Lynwood Landy Raddle M.D.   On: 10/30/2023 16:30    Vitals:   10/31/23 1100 10/31/23 1200 10/31/23 1308 10/31/23 1400  BP:      Pulse: (!) 56  62 (!) 56  Resp: 19 19 (!) 26 (!) 23  Temp: 99.3 F (37.4 C) 99.1 F (37.3 C) 99 F (37.2 C) 99 F (37.2 C)  TempSrc:      SpO2: 100%  93% 99%  Weight:      Height:         PHYSICAL EXAM General:  critically ill elderly female intubated CV: Regular rate and rhythm on monitor Respiratory:  Regular, unlabored respirations on ventilator  GI: Abdomen soft and nontender   NEURO:  Mental Status: eyes are closed, intubated on no sedation. Does not open eyes to voice or noxious stimulation. Does not follow commands   Cranial Nerves:  II: PERRL. Visual fields with no blink to threat  III, IV, VI: EOMI V: Sensation is intact to light touch and symmetrical to face.  VII: unable to assess  VIII: hearing intact to voice. IX, X: Palate elevates symmetrically. Phonation is normal.  KP:Dynloizm shrug 5/5. XII: tongue is midline without  fasciculations. Motor: minimal movement in all 4 extremities  Tone: is normal and bulk is normal Sensation- trace withdrawal  Coordination: unable to assess  Gait- deferred  Most Recent NIH   1a Level of Conscious.: 2 1b LOC Questions: 2 1c LOC Commands: 2 2 Best Gaze: 1 3 Visual: 2 4 Facial Palsy: 0 5a Motor Arm - left: 4 5b Motor Arm - Right: 3 6a Motor Leg - Left: 3 6b Motor Leg - Right: 3 7 Limb Ataxia: 0 8 Sensory: 2 9 Best Language: 3 10 Dysarthria: 2 11 Extinct. and Inatten.: 2 TOTAL:  29   ASSESSMENT/PLAN  Sheila Myers is a  89 y.o. female with history of hx of prior stroke, HTN, HLD, GERD, IBS, pre-diabetes who presented 10/30/23 after a fall at home where she hit her head on the sink, no LOC.  She initially presented as a level 1 trauma. No LOC, but immediately altered per EMS. Not anticoagulated. Initial BP 196/90, HR 110, CBG 137 with EMS.  She made lunch for her son around 1300 and then at 1500 she was walking to the bathroom and had an unwitnessed fall.  She was intubated in the ED for decreased LOC, decreased RR. EDP noted that she was not withdrawing to pain on left side.  CTA done showed an occluded right ICA and a code stroke was activated. SABRA  NIH on Admission 32  Acute Ischemic Infarct:  right MCA  s/p mechanical thrombectomy and TNK of right ICA and M1 with TICI 2b revascularization  Etiology:    large vessel disease  Code Stroke CT head ubtle loss of gray-white differentiation at the right insula, likely reflecting an acute right middle cerebral artery territory infarct. Known small chronic infarcts within the right thalamus and bilateral cerebellar hemispheres. Background parenchymal atrophy and chronic small vessel ischemic disease. CTA head & neck  right internal carotid artery is occluded at its origin and remains occluded throughout the remainder of the neck. Severe atherosclerotic stenosis at the right vertebral artery origin. Atherosclerotic plaque  within the vertebral artery V3 and V4 segments with sites of severe stenosis, bilaterally.  right internal carotid artery remains occluded intracranially. The right middle cerebral artery is occluded at its origin. Additionally, the right anterior cerebral artery A1 segment is occluded at its very proximal aspect. Repeat Ct head Subarachnoid hyperdensity over the right MCA territory, favored to represent contrast staining  Received mannitol overnight  Repeat CT head tonight @ 1700  MRI  when able  2D Echo ordered  EEG 10/3: - Continuous slow, generalized  and lateralized right hemisphere IMPRESSION: This technically difficult  study is suggestive of cortical dysfunction arising from  right hemisphere likely secondary to underlying structural abnormality. Additionally there is moderate diffuse encephalopathy. No seizures or epileptiform discharges were seen throughout the recording. LDL 48 HgbA1c 5.7 VTE prophylaxis - SCD's No antithrombotic prior to admission, now on No antithrombotic for 24 hrs post TNK with repeat imaging with no hemorrhage  Therapy recommendations:  Pending Disposition:  pending   Hx of Stroke/TIA small chronic infarcts within the right thalamus and bilateral cerebellar hemispheres   Hypertension Home meds:  lotrel 10-20mg  , lasix 20mg   Now Stable Cleviprex now off  Blood Pressure Goal: SBP less than 160   Hyperlipidemia Home meds:  atorvastatin 20mg , not resumed in hospital LDL 48, goal < 70 Continue statin at discharge  Acute respiratory failure due to inability to protect airway  COPD Management per CCM  On no sedation currently  On alb and advair inhalers   AKI  Likely due to contrast  Cr 1.27, on admission 0.78 Monitor   Hyperglycemia  DM 2  A1c 5.7  On SSI   Dysphagia Patient has post-stroke dysphagia, SLP consulted    Diet   Diet NPO time specified   Advance diet as tolerated  PAD  CTA runoff overnight due to loss of RLE pulse   Results Extensive multivessel atherosclerotic disease with severe stenoses and occlusions, including: severe right renal artery stenosis, moderate-to-severe left renal artery stenosis; approximately 50% proximal superior mesenteric artery stenosis; severe right and left common femoral artery stenoses; complete occlusion of the  right and left superficial femoral arteries; and probable occlusions of the popliteal and infrapopliteal arteries bilaterally.  Other Stroke Risk Factors Older age    Other Active Problems PAD Anxiety and depression  Glaucoma  Chronic pain  GERD IBS    Hospital day # 1   Karna Geralds DNP, ACNPC-AG  [facility]  I have personally obtained history,examined this patient, reviewed notes, independently viewed imaging studies, participated in medical decision making and plan of care.ROS completed by me personally and pertinent positives fully documented  I have made any additions or clarifications directly to the above note. Agree with note above.  Patient presented with right carotid occlusion and underwent emergent thrombectomy and TNK administration however right ACA could not be revascularized.  Her neurological exam remains poor and suspect significant deficits given her advanced age and multiple medical comorbidities.  Continue ventilatory support and check follow-up 24-hour post TNK brain imaging.  Long discussion with patient's son over the phone and sister and daughter at the bedside and answered questions.  Family understands prognosis is guarded would like to continue support for the next 24 to 48 hours.  I recommend he consider CODE STATUS and goals of care.  Discussed with Dr. Harold critical care medicine This patient is critically ill and at significant risk of neurological worsening, death and care requires constant monitoring of vital signs, hemodynamics,respiratory and cardiac monitoring, extensive review of multiple databases, frequent  neurological assessment, discussion with family, other specialists and medical decision making of high complexity.I have made any additions or clarifications directly to the above note.This critical care time does not reflect procedure time, or teaching time or supervisory time of PA/NP/Med Resident etc but could involve care discussion time.  I spent 50 minutes of neurocritical care time  in the care of  this patient.      Eather Popp, MD Medical Director [facility] Pager: [phone] 10/31/2023 3:36 PM   To contact Stroke Continuity provider, please refer to [email or web address]. After hours, contact General Neurology

## 2023-10-31 NOTE — TOC Initial Note (Signed)
 Transition of Care [facility] - Initial/Assessment Note    Patient Details  Name: Sheila Myers MRN: [id number] Date of Birth: [birth year]  Transition of Care [facility]) CM/SW Contact:    Sheila GORMAN Kindle, LCSW Phone Number: 10/31/2023, 4:45 PM  Clinical Narrative:                 Patient admitted from home with stroke. Patient is currently intubated. CSW continuing to follow as needs arise.      Barriers to Discharge: Continued Medical Work up   Patient Goals and CMS Choice            Expected Discharge Plan and Services       Living arrangements for the past 2 months: Single Family Home                                      Prior Living Arrangements/Services Living arrangements for the past 2 months: Single Family Home   Patient language and need for interpreter reviewed:: Yes        Need for Family Participation in Patient Care: Yes (Comment) Care giver support system in place?: Yes (comment)   Criminal Activity/Legal Involvement Pertinent to Current Situation/Hospitalization: No - Comment as needed  Activities of Daily Living      Permission Sought/Granted                  Emotional Assessment Appearance:: Appears stated age Attitude/Demeanor/Rapport: Unable to Assess Affect (typically observed): Unable to Assess   Alcohol / Substance Use: Not Applicable Psych Involvement: No (comment)  Admission diagnosis:  Stroke [facility]) [I63.9] Stroke (cerebrum) (HCC) [I63.9] Patient Active Problem List   Diagnosis Date Noted   Stroke (HCC) 10/30/2023   Stroke (cerebrum) (HCC) 10/30/2023   PCP:  Pcp, No Pharmacy:   [facility] - [place], [place] - [address] AT SEC OF [address] [place] [address] Phone: [phone] Fax: [phone]     Social Drivers of Health (SDOH) Social History:   SDOH Interventions:     Readmission Risk Interventions     No data to display

## 2023-10-31 NOTE — Progress Notes (Signed)
 NAME:  Sheila Myers, MRN:  [id number], DOB:  1928/04/14, LOS: 1 ADMISSION DATE:  10/30/2023, CONSULTATION DATE:  10/30/23 REFERRING MD:  Pamella, CHIEF COMPLAINT:  fall    History of Present Illness:  89 yo F  PMH prior stroke, HTN, HLD, GERD, IBS, pre-diabetes who presented 10/30/23 after a fall at home where she hit her head on the sink, no LOC. Initial BP 196 SBP.   She was intubated in the ED. Initially worked up as a trauma but it was discovered that she has s R MCA stroke. CTA neck w occluded R ICA, 70% stenosed prox L ICA and vertebral artery stenosis  Started on clevi gtt.  Underwent thrombectomy of right ICA and M1 segments, Right ACA not amenable to thrombectomy.  Post procedure overnight developed acute hypertension and bradycardia, stat CTH showed edema with minimal subarachnoid hyperdensity (unclear contrast staining vs hemorrhage). She was loaded with mannitol and hypertonic by neurology.     Pertinent  Medical History  No known contributory PMH   Significant Hospital Events: Including procedures, antibiotic start and stop dates in addition to other pertinent events   10/30/23 fall at home, hit head on sink. Intubated in ED. Found to have  10/2: mechanical thrombectomy with NIR, remains intubated  Interim History / Subjective:  Events as above, additionally received CTA due to difficult to assess distal pulses which did not show occlusion. Remains on NS at 40 ml/hr, Cleviprex at 2 mg/hr.  No sedation.    Objective    Blood pressure (!) 147/45, pulse (!) 124, temperature 99.3 F (37.4 C), resp. rate (!) 21, height 5' 3 (1.6 m), weight 55.1 kg, SpO2 100%.    Vent Mode: PRVC FiO2 (%):  [50 %-100 %] 60 % Set Rate:  [16 bmp] 16 bmp Vt Set:  [420 mL] 420 mL PEEP:  [5 cmH20] 5 cmH20 Plateau Pressure:  [12 cmH20-17 cmH20] 12 cmH20   Intake/Output Summary (Last 24 hours) at 10/31/2023 1038 Last data filed at 10/31/2023 1000 Gross per 24 hour  Intake 2126.8 ml  Output 655 ml   Net 1471.8 ml   Filed Weights   10/30/23 1743 10/30/23 1932 10/31/23 0700  Weight: 56.7 kg 40.1 kg 55.1 kg    Examination: General: elderly adult female, on  mechanical ventilation without sedation  HENT: [place] Lungs: clear, mechanical vent with scant blood tinge secretions.  Cardiovascular: regular rate and rhythm, no edema.  Abdomen: soft Extremities: no edema Neuro: non eye opening, +corneal and cough, pupils difficult to assess =2 and possibly sluggish to nonreactive.  Withdrawal in right upper extremity. GU: defer   Resolved problem list   Assessment and Plan    R MCA CVA, cerebral edema R ICA occlusion L ICA stenosis Vertebral artery stenosis  Hx prior stroke P -s/p TNK -s/p NIR intervention with thrombectomy -s/p mannitol 10/2 -on clevi to maintain SBP 120-140 -secondary workup per neuro: TTE completed and pending -fq neuro checks  -minimize sedation  Endotracheally intubated  Hx asthma  P -Continue full vent support with lung protective strategies, SAT/SBT as able but  neurological exam would preclude extubation.   Acute kidney injury -Baseline creatinine 0.7, peak at 1.27 today P: - Likely multifactorial with contrasted studies, labile blood pressure overnight, additionally found to have renal artery stenosis on imaging overnight - continue IVF with saline at 40 ml/hr. Foley in place with appropriate UOP overnight 50-100 ml/hr over the last 3 hours.  HTN HLD P -BP goal per NIR and  neuro -statin when appropriate   GERD -Pepcid ordered  Hyperglycemia -Start SSI q 4 hours  Peripheral arterial disease - CTA completed overnight, found to have extensive multivessel atherosclerotic disease with severe stenosis and occlusions including severe right renal artery stenosis and mod to severe left renal artery stenosis.  severe right and left common femoral artery stenoses; complete occlusion of the right and left superficial femoral arteries; and probable  occlusions of the popliteal and infrapopliteal arteries bilaterally.  Family updated by neurology this am   Labs   CBC: Recent Labs  Lab 10/30/23 1653 10/30/23 1704 10/30/23 1708 10/30/23 1744  WBC 6.5  6.5  --   --   --   NEUTROABS 5.0  --  7.0  --   HGB 10.1*  10.0* 10.5*  --  10.5*  HCT 32.1*  32.7* 31.0*  --  31.0*  MCV 72.0*  72.7*  --   --   --   PLT 172  175  --   --   --     Basic Metabolic Panel: Recent Labs  Lab 10/30/23 1653 10/30/23 1704 10/30/23 1744 10/30/23 2040 10/31/23 0831  NA 138 142 143 141 137  K 2.8* 2.9* 3.1* 3.3* 3.9  CL 106 104  --  109 111  CO2 21*  --   --  23 15*  GLUCOSE 124* 125*  --  116* 249*  BUN 18 20  --  17 17  CREATININE 0.78 0.80  --  0.80 1.27*  CALCIUM 8.2*  --   --  8.7* 8.2*   GFR: Estimated Creatinine Clearance: 22.9 mL/min (A) (by C-G formula based on SCr of 1.27 mg/dL (H)). Recent Labs  Lab 10/30/23 1653 10/30/23 1704  WBC 6.5  6.5  --   LATICACIDVEN  --  1.4    Liver Function Tests: Recent Labs  Lab 10/30/23 1653  AST 20  ALT 20  ALKPHOS 44  BILITOT 0.7  PROT 5.5*  ALBUMIN 2.8*   No results for input(s): LIPASE, AMYLASE in the last 168 hours. No results for input(s): AMMONIA in the last 168 hours.  ABG    Component Value Date/Time   PHART 7.358 10/30/2023 1744   PCO2ART 45.4 10/30/2023 1744   PO2ART 337 (H) 10/30/2023 1744   HCO3 25.8 10/30/2023 1744   TCO2 27 10/30/2023 1744   O2SAT 100 10/30/2023 1744     Coagulation Profile: Recent Labs  Lab 10/30/23 1653  INR 1.4*    Cardiac Enzymes: No results for input(s): CKTOTAL, CKMB, CKMBINDEX, TROPONINI in the last 168 hours.  HbA1C: Hgb A1c MFr Bld  Date/Time Value Ref Range Status  10/30/2023 08:40 PM 5.7 (H) 4.8 - 5.6 % Final    Comment:    (NOTE) Diagnosis of Diabetes The following HbA1c ranges recommended by the American Diabetes Association (ADA) may be used as an aid in the diagnosis of diabetes  mellitus.  Hemoglobin             Suggested A1C NGSP%              Diagnosis  <5.7                   Non Diabetic  5.7-6.4                Pre-Diabetic  >6.4                   Diabetic  <7.0  Glycemic control for                       adults with diabetes.      CBG: Recent Labs  Lab 10/30/23 1607  GLUCAP 110*    Review of Systems:   Unable to obtain   Past Medical History:  She,  has no past medical history on file. HTN HLD GERD IBS preDM  Surgical History:    Social History:      Family History:  Her family history is not on file.   Allergies No Known Allergies   Home Medications  Prior to Admission medications   Not on File     Critical care time:        CRITICAL CARE 35 Performed by: Lucie KATHEE Irving

## 2023-10-31 NOTE — Progress Notes (Signed)
 At approximately 2200 the patient's heart rate spontaneously became bradycardic, with episodes dropping as low as in the 20's. Leading up to this episode propofol and fentanyl were being weaned in an attempt to obtain a baseline neurological exam. Propofol and fentanyl were turned off and E-link was called upon bradycardia. A 12 lead ECG was obtained and showed sinus bradycardia with SBP being maintained within range. Atropine was given with little to no change in HR and an order for a stat CT was obtained. Patient transferred to and from CT with no complications. Dr. Sallyann was made aware and rounded on the patient and orders were received and implemented. Continuing to monitor patient and neuro/vasc status closely.

## 2023-10-31 NOTE — Progress Notes (Signed)
 EEG complete - results pending

## 2023-10-31 NOTE — Progress Notes (Signed)
 NIR Brief Note:  I was called by Medford Dresser and informed that the arterial pulse (which was detectable earlier in the evening) was now not detectable by doppler in the RLE.  Due to recent closure as well as recent arterial embolism, I ordered a stat CTA to help clarify the etiology.  He also stated that she had episodes of bradycardia (requiring epinephrine), but that those episodes had stabilized.  Will follow up when CTA exam is completed.

## 2023-10-31 NOTE — Progress Notes (Signed)
 Pt transported while on ventilator to CT and back w/o complication.

## 2023-10-31 NOTE — Progress Notes (Addendum)
 Witnessed 90 second rhythmic head jerking with increased HR from 60's to 120's and desaturation from 100% to 73% SpO2 on 100% FiO2. VS returned to prior parameters, RN and MD aware.

## 2023-10-31 NOTE — Progress Notes (Signed)
 PT Cancellation Note  Patient Details Name: Sheila Myers MRN: [id number] DOB: 04-12-1928   Cancelled Treatment:    Reason Eval/Treat Not Completed: (P) Active bedrest order. Will plan to follow-up as able once activity orders progress.   Theo Ferretti, PT, DPT Acute Rehabilitation Services  Office: [phone]    [name] 10/31/2023, 7:58 AM

## 2023-10-31 NOTE — Progress Notes (Signed)
 Informed that patient had persistent bradycardia (to 30's), increased blood pressure, widening pulse pressure and therefore was sent for STAT CTH by CCM. CTH with edema as well as minimal subarachnoid hyperdensity. CTH unfortunately not dual energy, therefore possibly contrast rather than hemorrhage.   CCM administered atropine for bradycardia.  Loaded with mannitol and 3% HTS for cerebral edema. Na q6h. Patient difficult stick therefore a-line placed.  Son Harman & daughter Bascom were updated over the phone.   Later informed that patient did not have pulses even with Doppler. Dr. Ray was paged and recommended CTA which confirmed that femoral access site was still open. See Dr. Edd note re: inability to detect signals in feet, however can proximally. Pending repeat exam.  Normie Blower, MD [facility]  CRITICAL CARE Performed by: Normie CHRISTELLA Blower   Total critical care time: 32 minutes  Critical care time was exclusive of separately billable procedures and treating other patients.  Critical care was necessary to treat or prevent imminent or life-threatening deterioration.  Critical care was time spent personally by me on the following activities: development of treatment plan with patient and/or surrogate as well as nursing, discussions with consultants, evaluation of patient's response to treatment, examination of patient, obtaining history from patient or surrogate, ordering and performing treatments and interventions, ordering and review of laboratory studies, ordering and review of radiographic studies, pulse oximetry and re-evaluation of patient's condition.

## 2023-10-31 NOTE — Procedures (Signed)
 Patient Name: KAMARA ALLAN  MRN: [id number]  Epilepsy Attending: Arlin MALVA Krebs  Referring Physician/Provider: Waddell Karna LABOR, NP  Date: 10/31/2023 Duration: 30.21 mins  Patient history: 89yo f with ams. EEG to evaluate for seizure  Level of alertness: Awake/ lethargic   AEDs during EEG study: None  Technical aspects: This EEG study was done with scalp electrodes positioned according to the 10-20 International system of electrode placement. Electrical activity was reviewed with band pass filter of 1-70Hz , sensitivity of 7 uV/mm, display speed of 49mm/sec with a 60Hz  notched filter applied as appropriate. EEG data were recorded continuously and digitally stored.  Video monitoring was available and reviewed as appropriate.  Description: EEG showed continuous generalized and lateralized right hemisphere 3 to 6 Hz theta-delta slowing. Hyperventilation and photic stimulation were not performed.     Of note, study was technically difficult due to significant myogenic artifact  ABNORMALITY - Continuous slow, generalized  and lateralized right hemisphere  IMPRESSION: This technically difficult  study is suggestive of cortical dysfunction arising from  right hemisphere likely secondary to underlying structural abnormality. Additionally there is moderate diffuse encephalopathy. No seizures or epileptiform discharges were seen throughout the recording.  Elmarie Devlin O Landis Cassaro

## 2023-10-31 NOTE — Progress Notes (Signed)
  Echocardiogram 2D Echocardiogram has been performed.  Sheila Myers Sheila Myers 10/31/2023, 10:26 AM

## 2023-10-31 NOTE — Progress Notes (Signed)
 NIR Brief Follow-up Note:  Patient seen and evaluated due to diminished pulse exam in bilateral lower extremities.  A CTA runoff was performed which demonstrated that there was no aortic dissection or acute embolism (given recent stroke).  Also, the right femoral access site was patent (and not occluded at the closure site or otherwise).  There are several chronic findings to include severe native peripheral arterial disease, dense common femoral plaque, and occlusions of the native SFAs).  Likely due to severe PAD and technique, the tibial arteries were not opacified.  On exam, the right femoral pulse is palpable and signals are audible in the proximal thighs bilaterally.  I am also able to detect signals in the popliteal fossa bilaterally.  I have marked sites in the lower legs which I suspect are monophasic PT or collateral signals.  These are faint.  I could not detect signals in the feet.  Both feet are warm (and bare hugger with warm blankets have been applied).  Overall, I suspect she is experiencing some peripheral vasoconstriction in the setting of chronic, severe PAD.  I will evaluate her again in a few hours but I am reassured by her proximal pulse exam as well as her CTA findings demonstrating patency of the femoral access site.  Please call with questions.

## 2023-10-31 NOTE — Progress Notes (Signed)
 [facility] ADULT ICU REPLACEMENT PROTOCOL   The patient does apply for the [facility] Adult ICU Electrolyte Replacment Protocol based on the criteria listed below:   1.Exclusion criteria: TCTS, ECMO, Dialysis, and Myasthenia Gravis patients 2. Is GFR >/= 30 ml/min? Yes.    Patient's GFR today is >60 3. Is SCr </= 2? Yes.   Patient's SCr is 0.80 mg/dL 4. Did SCr increase >/= 0.5 in 24 hours? No. 5.Pt's weight >40kg  Yes.   6. Abnormal electrolyte(s): K+ = 3.3  7. Electrolytes replaced per protocol 8.  Call MD STAT for K+ </= 2.5, Phos </= 1, or Mag </= 1 Physician:  Claudene, eMD   Sheila Myers 10/31/2023 5:31 AM

## 2023-10-31 NOTE — Procedures (Signed)
 Arterial Line Insertion Start/End10/03/2023 2:45 AM, 10/31/2023 3:12 AM  Patient location: ICU. Preanesthetic checklist: patient identified, IV checked, site marked, risks and benefits discussed, surgical consent, monitors and equipment checked, pre-op evaluation and timeout performed Patient sedated Left, radial was placed Catheter size: 20 G Hand hygiene performed  and maximum sterile barriers used   Attempts: 1 Following insertion, dressing applied and Biopatch. Post procedure assessment: normal  Patient tolerated the procedure well with no immediate complications.  Alarms Checked

## 2023-10-31 NOTE — Progress Notes (Signed)
Patient was transported to CT & back to 4N24 without any complications.  

## 2023-10-31 NOTE — Progress Notes (Signed)
 Pt transported on ventilator to CT and back w/o complication.

## 2023-10-31 NOTE — Progress Notes (Signed)
 Overnight update - Had some head jerking that was rhythmic to her R. Loaded with Keppra (1.5g only, bit less than 30mg /kg because of AKI) and started 500mg  BID.  - Continues to have tremulousness at R shoulder and with R hand but witnessed, not rhythmic or epileptic-y  - Restarted fluids for AKI

## 2023-10-31 NOTE — Progress Notes (Signed)
 At 2000 pt had episode of rhythmic jerking of her head that lasted about 90 seconds.  HR increased from 60s to 120s.  O2 decreased to the 70s.  Once jerking subsided, vitals returned to what they were pre episode, HR in 60s, O2 in 90s.  Dr. Sallyann with neurology paged at 2003.  Returned page with no new orders.  MD stated she would come to bedside.

## 2023-11-01 ENCOUNTER — Inpatient Hospital Stay (HOSPITAL_COMMUNITY)

## 2023-11-01 DIAGNOSIS — G936 Cerebral edema: Secondary | ICD-10-CM

## 2023-11-01 DIAGNOSIS — E1151 Type 2 diabetes mellitus with diabetic peripheral angiopathy without gangrene: Secondary | ICD-10-CM

## 2023-11-01 DIAGNOSIS — R001 Bradycardia, unspecified: Secondary | ICD-10-CM

## 2023-11-01 DIAGNOSIS — R29729 NIHSS score 29: Secondary | ICD-10-CM

## 2023-11-01 DIAGNOSIS — I63541 Cerebral infarction due to unspecified occlusion or stenosis of right cerebellar artery: Secondary | ICD-10-CM | POA: Diagnosis not present

## 2023-11-01 DIAGNOSIS — W19XXXA Unspecified fall, initial encounter: Secondary | ICD-10-CM

## 2023-11-01 DIAGNOSIS — I6381 Other cerebral infarction due to occlusion or stenosis of small artery: Secondary | ICD-10-CM | POA: Diagnosis not present

## 2023-11-01 DIAGNOSIS — I63511 Cerebral infarction due to unspecified occlusion or stenosis of right middle cerebral artery: Secondary | ICD-10-CM | POA: Diagnosis not present

## 2023-11-01 DIAGNOSIS — I779 Disorder of arteries and arterioles, unspecified: Secondary | ICD-10-CM | POA: Diagnosis not present

## 2023-11-01 DIAGNOSIS — I63521 Cerebral infarction due to unspecified occlusion or stenosis of right anterior cerebral artery: Secondary | ICD-10-CM | POA: Diagnosis not present

## 2023-11-01 DIAGNOSIS — I69391 Dysphagia following cerebral infarction: Secondary | ICD-10-CM

## 2023-11-01 DIAGNOSIS — G935 Compression of brain: Secondary | ICD-10-CM

## 2023-11-01 DIAGNOSIS — E1165 Type 2 diabetes mellitus with hyperglycemia: Secondary | ICD-10-CM

## 2023-11-01 LAB — GLUCOSE, CAPILLARY
Glucose-Capillary: 144 mg/dL — ABNORMAL HIGH (ref 70–99)
Glucose-Capillary: 144 mg/dL — ABNORMAL HIGH (ref 70–99)
Glucose-Capillary: 137 mg/dL — ABNORMAL HIGH (ref 70–99)

## 2023-11-01 LAB — SODIUM: Sodium: 150 mmol/L — ABNORMAL HIGH (ref 135–145)

## 2023-11-01 MED ORDER — MORPHINE 100MG IN NS 100ML (1MG/ML) PREMIX INFUSION
INTRAVENOUS | Status: AC
  Filled 2023-11-01: qty 100

## 2023-11-01 MED ORDER — MORPHINE 100MG IN NS 100ML (1MG/ML) PREMIX INFUSION
0.0000 mg/h | INTRAVENOUS | Status: DC
  Administered 2023-11-01 (×2): 5 mg/h via INTRAVENOUS

## 2023-11-01 MED ORDER — ACETAMINOPHEN 650 MG RE SUPP: 650.0000 mg | Freq: Four times a day (QID) | RECTAL | Status: DC | PRN

## 2023-11-01 MED ORDER — MORPHINE BOLUS VIA INFUSION: 5.0000 mg | INTRAVENOUS | Status: DC | PRN

## 2023-11-01 MED ORDER — ACETAMINOPHEN 325 MG PO TABS: 650.0000 mg | ORAL_TABLET | Freq: Four times a day (QID) | ORAL | Status: DC | PRN

## 2023-11-01 MED ORDER — SODIUM CHLORIDE 0.9 % IV SOLN: INTRAVENOUS | Status: DC

## 2023-11-01 MED ORDER — GLYCOPYRROLATE 0.2 MG/ML IJ SOLN: 0.2000 mg | INTRAMUSCULAR | Status: DC | PRN

## 2023-11-01 MED ORDER — GLYCOPYRROLATE 0.2 MG/ML IJ SOLN
0.2000 mg | INTRAMUSCULAR | Status: DC | PRN
  Administered 2023-11-01 (×2): 0.2 mg via INTRAVENOUS
  Filled 2023-11-01: qty 1

## 2023-11-01 MED ORDER — MIDAZOLAM HCL 2 MG/2ML IJ SOLN: 2.0000 mg | INTRAMUSCULAR | Status: DC | PRN

## 2023-11-01 MED ORDER — ONDANSETRON 4 MG PO TBDP: 4.0000 mg | ORAL_TABLET | Freq: Four times a day (QID) | ORAL | Status: DC | PRN

## 2023-11-01 MED ORDER — ONDANSETRON HCL 4 MG/2ML IJ SOLN: 4.0000 mg | Freq: Four times a day (QID) | INTRAMUSCULAR | Status: DC | PRN

## 2023-11-01 MED ORDER — GLYCOPYRROLATE 1 MG PO TABS: 1.0000 mg | ORAL_TABLET | ORAL | Status: DC | PRN

## 2023-11-01 MED ORDER — POLYVINYL ALCOHOL 1.4 % OP SOLN: 1.0000 [drp] | Freq: Four times a day (QID) | OPHTHALMIC | Status: DC | PRN

## 2023-11-01 NOTE — Progress Notes (Signed)
 NAME:  Sheila Myers, MRN:  [id number], DOB:  11-Mar-1928, LOS: 2 ADMISSION DATE:  10/30/2023, CONSULTATION DATE:  10/30/23 REFERRING MD:  Pamella, CHIEF COMPLAINT:  fall    History of Present Illness:  89 yo F  PMH prior stroke, HTN, HLD, GERD, IBS, pre-diabetes who presented 10/30/23 after a fall at home where she hit her head on the sink, no LOC. Initial BP 196 SBP.   She was intubated in the ED. Initially worked up as a trauma but it was discovered that she has s R MCA stroke. CTA neck w occluded R ICA, 70% stenosed prox L ICA and vertebral artery stenosis  Started on clevi gtt.  Underwent thrombectomy of right ICA and M1 segments, Right ACA not amenable to thrombectomy.  Post procedure overnight developed acute hypertension and bradycardia, stat CTH showed edema with minimal subarachnoid hyperdensity (unclear contrast staining vs hemorrhage). She was loaded with mannitol and hypertonic by neurology.     Pertinent  Medical History  No known contributory PMH   Significant Hospital Events: Including procedures, antibiotic start and stop dates in addition to other pertinent events   10/30/23 fall at home, hit head on sink. Intubated in ED. Found to have  10/2: mechanical thrombectomy with NIR, remains intubated 10/3 remain on Cleviprex and isoproterenol for symptomatic bradycardia, CT head showed increasing edema  Interim History / Subjective:  Patient remained on clevidipine infusion and isoproterenol infusions Heart rate remained in 50s Repeat CT head this morning showing increasing cerebral edema with midline shift, on hypertonic saline  Objective    Blood pressure (!) 147/45, pulse (!) 59, temperature 98.8 F (37.1 C), resp. rate (!) 24, height 5' 3 (1.6 m), weight 52.9 kg, SpO2 99%.    Vent Mode: PRVC FiO2 (%):  [60 %-80 %] 60 % Set Rate:  [16 bmp] 16 bmp Vt Set:  [420 mL] 420 mL PEEP:  [5 cmH20] 5 cmH20 Plateau Pressure:  [11 cmH20] 11 cmH20   Intake/Output Summary (Last 24  hours) at 11/05/2023 1013 Last data filed at 2023/11/05 0900 Gross per 24 hour  Intake 3010.76 ml  Output 982 ml  Net 2028.76 ml   Filed Weights   10/30/23 1932 10/31/23 0700 2023/11/05 0530  Weight: 40.1 kg 55.1 kg 52.9 kg    Examination: General: Crtitically ill-appearing elderly female, orally intubated HEENT: [place]/AT, eyes anicteric.  ETT and OGT in place Neuro: Eyes closed, does not open, not following commands, withdrawing in right upper extremity, plegic on left side Chest: Coarse breath sounds, no wheezes or rhonchi Heart: Bradycardic, regular rhythm, no murmurs or gallops Abdomen: Soft, nondistended, bowel sounds present  Labs and images reviewed  Patient Lines/Drains/Airways Status     Active Line/Drains/Airways     Name Placement date Placement time Site Days   Arterial Line 10/31/23 Left Radial 10/31/23  0245  Radial  1   Peripheral IV 10/30/23 18 G Right Antecubital 10/30/23  1606  Antecubital  2   Peripheral IV 10/31/23 20 G Anterior;Left;Proximal;Upper Arm 10/31/23  0000  Arm  1   Peripheral IV 10/31/23 20 G 1.88 Anterior;Right Forearm 10/31/23  0030  Forearm  1   Peripheral IV 10/31/23 20 G 2.5 Right;Upper Arm 10/31/23  0047  Arm  1   NG/OG Vented/Dual Lumen 16 Fr. Oral 60 cm 10/30/23  1620  Oral  2   Urethral Catheter Emily RN Temperature probe 16 Fr. 10/30/23  1717  Temperature probe  2   Airway 7 mm 10/30/23  1614  -- 2   Wound 10/30/23 1816 Other (Comment) Thigh Anterior;Proximal;Right 10/30/23  1816  Thigh  2         Resolved problem list  Hypokalemia  Assessment and Plan  Acute right MCA/ACA ischemic stroke status post TNK and mechanical thrombectomy Acute right ICA occlusion status post mechanical thrombectomy Cerebral edema with brain compression and midline shift Induced hypernatremia with hypertonic saline Acute encephalopathy in the setting of cerebral edema Seizure-like activity Acute respiratory failure with hypoxia Severe symptomatic  bradycardia on isoproterenol infusion Uncontrolled hypertension on clevidipine infusion Severe peripheral arterial disease Acute kidney injury likely due to contrast induced nephropathy  Continue secondary stroke prophylaxis Stroke team is following Holding antiplatelet agents in the setting of large ischemic stroke with high risk of hemorrhagic conversion On hypertonic saline at 75 cc/h Monitor serum sodium with goal 150-155, currently at goal Keep head and of the bed elevated Avoid hypotonic solution Sedation has been off for 48 hours now Patient had seizure-like activity overnight and upon admission, currently on Keppra Continue lung protective ventilation VAP prevention bundle in place Remain off sedation Tolerating spontaneous breathing trial but mental status precludes extubation Currently on isoproterenol, continue to titrate down if she becomes bradycardic then will increase but goal is to stop isoproterenol today Started on clevidipine infusion CTA aortobifemoral is consistent with severe peripheral arterial disease and bilateral severe renal artery stenosis and complete occlusion of bilateral superficial femoral arteries and probable occlusion of popliteal and infrapopliteal arteries bilaterally Monitor intake and output Avoid nephrotoxic agent  Overall prognosis guarded   Labs   CBC: Recent Labs  Lab 10/30/23 1653 10/30/23 1704 10/30/23 1708 10/30/23 1744  WBC 6.5  6.5  --   --   --   NEUTROABS 5.0  --  7.0  --   HGB 10.1*  10.0* 10.5*  --  10.5*  HCT 32.1*  32.7* 31.0*  --  31.0*  MCV 72.0*  72.7*  --   --   --   PLT 172  175  --   --   --     Basic Metabolic Panel: Recent Labs  Lab 10/30/23 1653 10/30/23 1704 10/30/23 1744 10/30/23 2040 10/31/23 0831 10/31/23 1527 10/31/23 2145 November 03, 2023 0335  NA 138 142 143 141 137 140 145 150*  K 2.8* 2.9* 3.1* 3.3* 3.9  --   --   --   CL 106 104  --  109 111  --   --   --   CO2 21*  --   --  23 15*  --   --    --   GLUCOSE 124* 125*  --  116* 249*  --   --   --   BUN 18 20  --  17 17  --   --   --   CREATININE 0.78 0.80  --  0.80 1.27*  --   --   --   CALCIUM  8.2*  --   --  8.7* 8.2*  --   --   --    GFR: Estimated Creatinine Clearance: 22.9 mL/min (A) (by C-G formula based on SCr of 1.27 mg/dL (H)). Recent Labs  Lab 10/30/23 1653 10/30/23 1704  WBC 6.5  6.5  --   LATICACIDVEN  --  1.4    Liver Function Tests: Recent Labs  Lab 10/30/23 1653  AST 20  ALT 20  ALKPHOS 44  BILITOT 0.7  PROT 5.5*  ALBUMIN 2.8*   No results for input(s):  LIPASE, AMYLASE in the last 168 hours. No results for input(s): AMMONIA in the last 168 hours.  ABG    Component Value Date/Time   PHART 7.358 10/30/2023 1744   PCO2ART 45.4 10/30/2023 1744   PO2ART 337 (H) 10/30/2023 1744   HCO3 25.8 10/30/2023 1744   TCO2 27 10/30/2023 1744   O2SAT 100 10/30/2023 1744     Coagulation Profile: Recent Labs  Lab 10/30/23 1653  INR 1.4*    Cardiac Enzymes: No results for input(s): CKTOTAL, CKMB, CKMBINDEX, TROPONINI in the last 168 hours.  HbA1C: Hgb A1c MFr Bld  Date/Time Value Ref Range Status  10/30/2023 08:40 PM 5.7 (H) 4.8 - 5.6 % Final    Comment:    (NOTE) Diagnosis of Diabetes The following HbA1c ranges recommended by the American Diabetes Association (ADA) may be used as an aid in the diagnosis of diabetes mellitus.  Hemoglobin             Suggested A1C NGSP%              Diagnosis  <5.7                   Non Diabetic  5.7-6.4                Pre-Diabetic  >6.4                   Diabetic  <7.0                   Glycemic control for                       adults with diabetes.      CBG: Recent Labs  Lab 10/31/23 1631 10/31/23 1951 10/31/23 2334 11-12-23 0328 11-12-2023 0735  GLUCAP 134* 125* 182* 144* 144*    The patient is critically ill due to acute right MCA/ACA stroke with severe cerebral edema and brain compression with midline shift.  Acute  respiratory failure with hypoxia..  Critical care was necessary to treat or prevent imminent or life-threatening deterioration.  Critical care was time spent personally by me on the following activities: development of treatment plan with patient and/or surrogate as well as nursing, discussions with consultants, evaluation of patient's response to treatment, examination of patient, obtaining history from patient or surrogate, ordering and performing treatments and interventions, ordering and review of laboratory studies, ordering and review of radiographic studies, pulse oximetry, re-evaluation of patient's condition and participation in multidisciplinary rounds.   During this encounter critical care time was devoted to patient care services described in this note for 40 minutes.     Valinda Novas, MD [facility] See Amion for pager If no response to pager, please call [phone] until 7pm After 7pm, Please call E-link [phone]

## 2023-11-01 NOTE — Progress Notes (Signed)
 TOD 1934.  Pronounced by Lyle Norlander RN and Quintin Barns RN.  Family at bedside.

## 2023-11-01 NOTE — Progress Notes (Signed)
 PT Cancellation Note  Patient Details Name: DENYCE HARR MRN: [id number] DOB: Jul 15, 1928   Cancelled Treatment:    Reason Eval/Treat Not Completed: (P) Active bedrest order. Will plan to follow-up as able once activity orders progress.   Theo Ferretti, PT, DPT Acute Rehabilitation Services  Office: [phone]    [name] Nov 08, 2023, 7:58 AM

## 2023-11-01 NOTE — Progress Notes (Addendum)
 STROKE TEAM PROGRESS NOTE    SIGNIFICANT HOSPITAL EVENTS 10/2 received IV TNK for right ICA occlusion and thrombectomy. Intubated in ED   INTERIM HISTORY/SUBJECTIVE Family at the bedside.  Repeat CT head this morning, evolving right hemisphere infarct with associated effacement of cerebral sulci and moderate effacement of right ventricle with leftward midline shift of 8 mm.  Family was updated by Dr. Rosemarie at the bedside.  Family is waiting for more people to arrive today but likely leaning towards comfort care Patient remains intubated on no sedation.  Last sodium 150, on 3% saline at 75 cc, and isoproterenol at 4 mcg. Overnight events noted  CBC    Component Value Date/Time   WBC 6.5 10/30/2023 1653   WBC 6.5 10/30/2023 1653   RBC 4.50 10/30/2023 1653   RBC 4.46 10/30/2023 1653   HGB 10.5 (L) 10/30/2023 1744   HCT 31.0 (L) 10/30/2023 1744   PLT 175 10/30/2023 1653   PLT 172 10/30/2023 1653   MCV 72.7 (L) 10/30/2023 1653   MCV 72.0 (L) 10/30/2023 1653   MCH 22.2 (L) 10/30/2023 1653   MCH 22.6 (L) 10/30/2023 1653   MCHC 30.6 10/30/2023 1653   MCHC 31.5 10/30/2023 1653   RDW 16.8 (H) 10/30/2023 1653   RDW 17.1 (H) 10/30/2023 1653   LYMPHSABS 0.8 10/30/2023 1708   MONOABS 0.5 10/30/2023 1708   EOSABS 0.1 10/30/2023 1708   BASOSABS 0.0 10/30/2023 1708    BMET    Component Value Date/Time   NA 150 (H) 2023/11/21 0335   K 3.9 10/31/2023 0831   CL 111 10/31/2023 0831   CO2 15 (L) 10/31/2023 0831   GLUCOSE 249 (H) 10/31/2023 0831   BUN 17 10/31/2023 0831   CREATININE 1.27 (H) 10/31/2023 0831   CALCIUM  8.2 (L) 10/31/2023 0831   GFRNONAA 39 (L) 10/31/2023 0831    IMAGING past 24 hours   Vitals:   11/21/2023 0756 21-Nov-2023 0800 Nov 21, 2023 0900 2023-11-21 1000  BP:      Pulse:  (!) 59 (!) 59 (!) 51  Resp:  (!) 24 (!) 28 (!) 25  Temp:  98.8 F (37.1 C) 98.6 F (37 C) 98.8 F (37.1 C)  TempSrc:      SpO2: 100% 99% 100% 100%  Weight:      Height:         PHYSICAL  EXAM General:  critically ill elderly female intubated CV: Regular rate and rhythm on monitor Respiratory:  Regular, unlabored respirations on ventilator  GI: Abdomen soft and nontender   NEURO:  Mental Status: eyes are closed, intubated on no sedation. Does not open eyes to voice or noxious stimulation. Does not follow commands   Cranial Nerves:  II: PERRL. Visual fields with no blink to threat  III, IV, VI: EOMI V: Sensation is intact to light touch and symmetrical to face.  VII: unable to assess  VIII: hearing intact to voice. IX, X: Palate elevates symmetrically. Phonation is normal.  KP:Dynloizm shrug 5/5. XII: tongue is midline without fasciculations. Motor: minimal movement in all 4 extremities  Tone: is normal and bulk is normal Sensation- trace withdrawal  Coordination: unable to assess  Gait- deferred  Most Recent NIH   1a Level of Conscious.: 2 1b LOC Questions: 2 1c LOC Commands: 2 2 Best Gaze: 1 3 Visual: 2 4 Facial Palsy: 0 5a Motor Arm - left: 4 5b Motor Arm - Right: 3 6a Motor Leg - Left: 3 6b Motor Leg - Right: 3 7 Limb  Ataxia: 0 8 Sensory: 2 9 Best Language: 3 10 Dysarthria: 2 11 Extinct. and Inatten.: 2 TOTAL:  29   ASSESSMENT/PLAN  Ms. ABIAGEAL BLOWE is a 89 y.o. female with history of hx of prior stroke, HTN, HLD, GERD, IBS, pre-diabetes who presented 10/30/23 after a fall at home where she hit her head on the sink, no LOC.  She initially presented as a level 1 trauma. No LOC, but immediately altered per EMS. Not anticoagulated. Initial BP 196/90, HR 110, CBG 137 with EMS.  She made lunch for her son around 1300 and then at 1500 she was walking to the bathroom and had an unwitnessed fall.  She was intubated in the ED for decreased LOC, decreased RR. EDP noted that she was not withdrawing to pain on left side.  CTA done showed an occluded right ICA and a code stroke was activated. SABRA  NIH on Admission 32  Acute Ischemic Infarct:  right MCA  s/p  mechanical thrombectomy and TNK of right ICA and M1 with TICI 2b revascularization  Etiology:    large vessel disease  Code Stroke CT head ubtle loss of gray-white differentiation at the right insula, likely reflecting an acute right middle cerebral artery territory infarct. Known small chronic infarcts within the right thalamus and bilateral cerebellar hemispheres. Background parenchymal atrophy and chronic small vessel ischemic disease. CTA head & neck  right internal carotid artery is occluded at its origin and remains occluded throughout the remainder of the neck. Severe atherosclerotic stenosis at the right vertebral artery origin. Atherosclerotic plaque within the vertebral artery V3 and V4 segments with sites of severe stenosis, bilaterally.  right internal carotid artery remains occluded intracranially. The right middle cerebral artery is occluded at its origin. Additionally, the right anterior cerebral artery A1 segment is occluded at its very proximal aspect. Repeat Ct head Subarachnoid hyperdensity over the right MCA territory, favored to represent contrast staining  Received mannitol overnight  Repeat CT head 10/3: Evolving right MCA and ACA territory infarcts. Interval increase in cerebral edema throughout the right cerebral hemisphere with approximately 8 mm leftward Repeat CT head 10/4: evolving right hemisphere infarct with associated effacement of cerebral sulci and moderate effacement of right ventricle with leftward midline shift of 8 mm. MRI  when able  2D Echo EF 40%.  Small pericardial effusion.  RV mildly reduced EEG 10/3: - Continuous slow, generalized  and lateralized right hemisphere IMPRESSION: This technically difficult  study is suggestive of cortical dysfunction arising from  right hemisphere likely secondary to underlying structural abnormality. Additionally there is moderate diffuse encephalopathy. No seizures or epileptiform discharges were seen throughout the  recording. LDL 48 HgbA1c 5.7 VTE prophylaxis - SCD's No antithrombotic prior to admission, now on No antithrombotic for 24 hrs post TNK with repeat imaging with no hemorrhage  Therapy recommendations:  Pending Disposition:  pending   Hx of Stroke/TIA small chronic infarcts within the right thalamus and bilateral cerebellar hemispheres   Cerebral edema Brain compression On 3% hypertonic saline.  Sodium goal 150-155 Currently at 75 cc/h Last sodium 150 Serial sodiums every 6   Hypertension Home meds:  lotrel 10-20mg  , lasix  20mg   Now Stable Cleviprex now off  Blood Pressure Goal: SBP less than 160   Hyperlipidemia Home meds:  atorvastatin  20mg , not resumed in hospital LDL 48, goal < 70 Continue statin at discharge  Acute respiratory failure due to inability to protect airway  COPD Management per CCM  On no sedation currently  On  alb and advair inhalers   AKI  Likely due to contrast  Cr 1.27, on admission 0.78 Monitor   Hyperglycemia  DM 2  A1c 5.7  On SSI   Dysphagia Patient has post-stroke dysphagia, SLP consulted    Diet   Diet NPO time specified   Advance diet as tolerated  PAD  CTA runoff overnight due to loss of RLE pulse  Results Extensive multivessel atherosclerotic disease with severe stenoses and occlusions, including: severe right renal artery stenosis, moderate-to-severe left renal artery stenosis; approximately 50% proximal superior mesenteric artery stenosis; severe right and left common femoral artery stenoses; complete occlusion of the right and left superficial femoral arteries; and probable occlusions of the popliteal and infrapopliteal arteries bilaterally.  Other Stroke Risk Factors Older age    Other Active Problems PAD Anxiety and depression  Glaucoma  Chronic pain  GERD IBS    Hospital day # 2   Karna Geralds DNP, ACNPC-AG  [facility]  I have personally obtained history,examined this patient, reviewed notes,  independently viewed imaging studies, participated in medical decision making and plan of care.ROS completed by me personally and pertinent positives fully documented  I have made any additions or clarifications directly to the above note. Agree with note above.  Patient neurological exam remains poor and she is barely responsive and not following commands.  Repeat CT scan this morning shows progressive cytotoxic edema in the right MCA and ACA territory with right-to-left midline shift.  Given patient's advanced age and multiple medical comorbidities she is unlikely to survive to send make any meaningful recovery.  I had multiple conversations with patient's son and daughters about the prognosis and answered questions.  Family has agreed to DNR and is leaning towards comfort care measures but await arrival of other family members to make final decision later today.  Discussed with Dr. Harold critical care medicine. This patient is critically ill and at significant risk of neurological worsening, death and care requires constant monitoring of vital signs, hemodynamics,respiratory and cardiac monitoring, extensive review of multiple databases, frequent neurological assessment, discussion with family, other specialists and medical decision making of high complexity.I have made any additions or clarifications directly to the above note.This critical care time does not reflect procedure time, or teaching time or supervisory time of PA/NP/Med Resident etc but could involve care discussion time.  I spent 45 minutes of neurocritical care time  in the care of  this patient.      Eather Popp, MD Medical Director [facility] Stroke Center Pager: [phone] 11/05/2023 3:10 PM    To contact Stroke Continuity provider, please refer to [email or web address]. After hours, contact General Neurology

## 2023-11-01 NOTE — Progress Notes (Signed)
 PT Cancellation Note  Patient Details Name: Sheila Myers MRN: [id number] DOB: 11/13/1928   Cancelled Treatment:    Reason Eval/Treat Not Completed: (P) Other (comment). RN reporting pt is now going comfort care measures. Will sign off. Please re-consult if GOC change and if needed and medically appropriate.   Theo Ferretti, PT, DPT Acute Rehabilitation Services  Office: [phone]    [name] 11-09-2023, 3:53 PM

## 2023-11-01 NOTE — Progress Notes (Signed)
 eLink Physician-Brief Progress Note Patient Name: Sheila Myers DOB: [birth year] MRN: [id number]   Date of Service  11-23-23  HPI/Events of Note  89 year old female here with right ICA occlusion leading to right MCA territory stroke, status post TNK and mechanical thrombectomy   Known AKI with 1 L positive for the day, 2.3 L positive for the stay overall.  eICU Interventions  Continue LR at 40 cc an hour, no intervention indicated currently.  Suspect ATN, no identifiable offending agents on MAR     Intervention Category Intermediate Interventions: Oliguria - evaluation and management  Sheila Myers 11/23/23, 4:02 AM

## 2023-11-01 NOTE — Progress Notes (Signed)
 Pt transported to CT and back via ventilator with no apparent complications.

## 2023-11-01 NOTE — Progress Notes (Signed)
 OT Cancellation Note  Patient Details Name: Sheila Myers MRN: [id number] DOB: 01/28/1929   Cancelled Treatment:    Reason Eval/Treat Not Completed: Other (comment). RN reporting pt is now going comfort care measures. Will sign off. Please re-consult if GOC change and if needed and medically appropriate.   [name] FREDERICK, OTR/L [facility] Acute Rehabilitation Office: [phone]   [name] 11/10/23, 3:53 PM

## 2023-11-01 NOTE — Procedures (Signed)
 Extubation Procedure Note  Patient Details:   Name: Sheila Myers DOB: 1928-06-29 MRN: [id number]   Airway Documentation:    Vent end date: 2023/11/17 Vent end time: 1316   Evaluation  O2 sats:   Complications: No apparent complications Patient did tolerate procedure well. Bilateral Breath Sounds: Clear, Diminished   No Pt was extubated to comfort care measures.   Germain JAYSON Mater 17-Nov-2023, 1:17 PM

## 2023-11-03 ENCOUNTER — Encounter: Payer: Self-pay | Admitting: Internal Medicine

## 2023-11-07 ENCOUNTER — Ambulatory Visit: Admitting: Family Medicine

## 2023-11-29 NOTE — Death Summary Note (Signed)
 Patient ID: Sheila Myers MRN: [id number] DOB/AGE: 1928/06/26 89 y.o.  Admit date: 09-Nov-2023 Death date: 11/11/2023 at 19:36  Admission Diagnoses:Fall  Cause of Death: Large right hemispheric infarct with cytotoxic edema and brain herniation due to right carotid and middle cerebral artery occlusion treated with thrombolysis with IV TNK followed by successful mechanical thrombectomy of right carotid and MCA occlusion but right ACA occlusion was not amenable to thrombectomy.  Patient with poor neurological exam and respiratory failure made DNR by family and comfort care measures and ventilatory support withdrawn  Pertinent Medical Diagnosis: Principal Problem:   Stroke [facility]) Active Problems:   Stroke (cerebrum) (HCC) Large right hemispheric stroke Cytotoxic edema Brain herniation Right carotid and middle cerebral artery occlusion Respiratory failure secondary to large stroke and inability to protect airway Mild AKI due to contrast   Hospital Course:  89 y.o. female with hx of prior stroke, HTN, HLD, GERD, IBS, pre-diabetes who presented Nov 09, 2023 after a fall at home where she hit her head on the sink, no LOC.  She initially presented as a level 1 trauma. No LOC, but immediately altered per EMS. Not anticoagulated. Initial BP 196/90, HR 110, CBG 137 with EMS.  She made lunch for her son around 1300 and then at 1500 she was walking to the bathroom and had an unwitnessed fall.  She was intubated in the ED for decreased LOC, decreased RR. EDP noted that she was not withdrawing to pain on left side.  CTA done showed an occluded right ICA and a code stroke was activated.  NIH stroke scale was 32 on admission.  She received IV TNK after careful discussion of risk benefits and alternatives with the patient.  She also underwent mechanical thrombectomy which was successful with opening up the right carotid and middle cerebral artery but unfortunately the right M2 cerebral artery remained occluded.  She  remained intubated requiring ventilatory support for respiratory failure with poor exam.  Follow-up CT scan showed evolving right MCA and ACA territory infarct with cytotoxic edema with 8 mm leftward midline shift and small area of subarachnoid hemorrhage of the right cerebral convexity.  Patient was treated with hypertonic saline with neurological exam remained poor.  Family understood and poor prognosis and realized patient would not survive without prolonged ventilatory support and 24-hour nursing care and likely have to move to a nursing home.  Family clearly did not want that.  After multiple discussions with the patient's daughter and son family agreed to DNR and comfort care measures only.  Patient was extubated and kept comfortable and passed away shortly thereafter.  Signed: Eather Popp 11/03/2023, 2:24 PM

## 2023-11-29 DEATH — deceased
